# Patient Record
Sex: Female | Born: 1950 | Race: White
Health system: Southern US, Community
[De-identification: ages and names within clinical notes are randomized; demographics above are authoritative.]

## PROBLEM LIST (undated history)

## (undated) DIAGNOSIS — R252 Cramp and spasm: Secondary | ICD-10-CM

## (undated) DIAGNOSIS — S62609A Fracture of unspecified phalanx of unspecified finger, initial encounter for closed fracture: Secondary | ICD-10-CM

## (undated) DIAGNOSIS — M1990 Unspecified osteoarthritis, unspecified site: Secondary | ICD-10-CM

## (undated) DIAGNOSIS — M899 Disorder of bone, unspecified: Secondary | ICD-10-CM

## (undated) DIAGNOSIS — R5381 Other malaise: Secondary | ICD-10-CM

## (undated) DIAGNOSIS — L719 Rosacea, unspecified: Secondary | ICD-10-CM

## (undated) DIAGNOSIS — K2100 Gastro-esophageal reflux disease with esophagitis, without bleeding: Secondary | ICD-10-CM

## (undated) DIAGNOSIS — M2550 Pain in unspecified joint: Secondary | ICD-10-CM

## (undated) DIAGNOSIS — R599 Enlarged lymph nodes, unspecified: Secondary | ICD-10-CM

## (undated) DIAGNOSIS — Q828 Other specified congenital malformations of skin: Secondary | ICD-10-CM

## (undated) DIAGNOSIS — R7309 Other abnormal glucose: Secondary | ICD-10-CM

## (undated) DIAGNOSIS — N183 Chronic kidney disease, stage 3 unspecified: Secondary | ICD-10-CM

## (undated) DIAGNOSIS — M545 Low back pain, unspecified: Secondary | ICD-10-CM

## (undated) DIAGNOSIS — E669 Obesity, unspecified: Secondary | ICD-10-CM

## (undated) DIAGNOSIS — K449 Diaphragmatic hernia without obstruction or gangrene: Secondary | ICD-10-CM

## (undated) DIAGNOSIS — Z860100 Personal history of colon polyps, unspecified: Secondary | ICD-10-CM

## (undated) DIAGNOSIS — E785 Hyperlipidemia, unspecified: Secondary | ICD-10-CM

## (undated) DIAGNOSIS — K5900 Constipation, unspecified: Secondary | ICD-10-CM

## (undated) DIAGNOSIS — I10 Essential (primary) hypertension: Secondary | ICD-10-CM

## (undated) DIAGNOSIS — M949 Disorder of cartilage, unspecified: Secondary | ICD-10-CM

## (undated) DIAGNOSIS — R002 Palpitations: Secondary | ICD-10-CM

## (undated) DIAGNOSIS — R079 Chest pain, unspecified: Secondary | ICD-10-CM

## (undated) DIAGNOSIS — E559 Vitamin D deficiency, unspecified: Secondary | ICD-10-CM

## (undated) DIAGNOSIS — R5383 Other fatigue: Secondary | ICD-10-CM

## (undated) DIAGNOSIS — M810 Age-related osteoporosis without current pathological fracture: Secondary | ICD-10-CM

## (undated) DIAGNOSIS — Z955 Presence of coronary angioplasty implant and graft: Secondary | ICD-10-CM

## (undated) DIAGNOSIS — Z531 Procedure and treatment not carried out because of patient's decision for reasons of belief and group pressure: Secondary | ICD-10-CM

## (undated) DIAGNOSIS — H539 Unspecified visual disturbance: Secondary | ICD-10-CM

## (undated) HISTORY — DX: Unspecified visual disturbance: H53.9

## (undated) HISTORY — DX: Presence of coronary angioplasty implant and graft: Z95.5

## (undated) HISTORY — DX: Procedure and treatment not carried out because of patient's decision for reasons of belief and group pressure: Z53.1

## (undated) HISTORY — DX: Essential (primary) hypertension: I10

## (undated) HISTORY — DX: Age-related osteoporosis without current pathological fracture: M81.0

## (undated) HISTORY — PX: CAROTID STENT: SHX1301

## (undated) HISTORY — DX: Rosacea, unspecified: L71.9

## (undated) HISTORY — DX: Enlarged lymph nodes, unspecified: R59.9

## (undated) HISTORY — DX: Palpitations: R00.2

## (undated) HISTORY — DX: Gastro-esophageal reflux disease with esophagitis, without bleeding: K21.00

## (undated) HISTORY — DX: Vitamin D deficiency, unspecified: E55.9

## (undated) HISTORY — DX: Low back pain, unspecified: M54.50

## (undated) HISTORY — DX: Personal history of colonic polyps: Z86.010

## (undated) HISTORY — DX: Chronic kidney disease, stage 3 (moderate): N18.3

## (undated) HISTORY — DX: Other specified congenital malformations of skin: Q82.8

## (undated) HISTORY — DX: Obesity, unspecified: E66.9

## (undated) HISTORY — DX: Other malaise: R53.81

## (undated) HISTORY — DX: Chronic kidney disease, stage 3 unspecified: N18.30

## (undated) HISTORY — DX: Disorder of bone, unspecified: M89.9

## (undated) HISTORY — DX: Other abnormal glucose: R73.09

## (undated) HISTORY — DX: Unspecified osteoarthritis, unspecified site: M19.90

## (undated) HISTORY — DX: Cramp and spasm: R25.2

## (undated) HISTORY — DX: Pain in unspecified joint: M25.50

## (undated) HISTORY — DX: Fracture of unspecified phalanx of unspecified finger, initial encounter for closed fracture: S62.609A

## (undated) HISTORY — DX: Disorder of cartilage, unspecified: M94.9

## (undated) HISTORY — DX: Diaphragmatic hernia without obstruction or gangrene: K44.9

## (undated) HISTORY — DX: Hyperlipidemia, unspecified: E78.5

## (undated) HISTORY — DX: Personal history of colon polyps, unspecified: Z86.0100

## (undated) HISTORY — DX: Constipation, unspecified: K59.00

## (undated) HISTORY — DX: Other malaise: R53.83

## (undated) HISTORY — DX: Chest pain, unspecified: R07.9

## (undated) HISTORY — PX: WRIST SURGERY: SHX841

## (undated) HISTORY — DX: Gastro-esophageal reflux disease with esophagitis: K21.0

## (undated) HISTORY — DX: Low back pain: M54.5

---

## 1983-06-15 HISTORY — PX: OTHER SURGICAL HISTORY: SHX169

## 1984-06-14 HISTORY — PX: BACK SURGERY: SHX140

## 1998-06-28 ENCOUNTER — Encounter: Payer: Self-pay | Admitting: Emergency Medicine

## 1998-06-28 ENCOUNTER — Emergency Department (HOSPITAL_COMMUNITY): Admission: EM | Admit: 1998-06-28 | Discharge: 1998-06-28 | Payer: Self-pay | Admitting: Emergency Medicine

## 2001-09-08 ENCOUNTER — Other Ambulatory Visit: Admission: RE | Admit: 2001-09-08 | Discharge: 2001-09-08 | Payer: Self-pay | Admitting: Family Medicine

## 2001-10-22 ENCOUNTER — Encounter: Payer: Self-pay | Admitting: Emergency Medicine

## 2001-10-22 ENCOUNTER — Observation Stay (HOSPITAL_COMMUNITY): Admission: EM | Admit: 2001-10-22 | Discharge: 2001-10-23 | Payer: Self-pay | Admitting: *Deleted

## 2001-10-24 ENCOUNTER — Encounter: Admission: RE | Admit: 2001-10-24 | Discharge: 2001-10-24 | Payer: Self-pay | Admitting: Family Medicine

## 2003-01-08 ENCOUNTER — Encounter: Payer: Self-pay | Admitting: Family Medicine

## 2003-01-08 ENCOUNTER — Encounter: Admission: RE | Admit: 2003-01-08 | Discharge: 2003-01-08 | Payer: Self-pay | Admitting: Family Medicine

## 2003-01-23 ENCOUNTER — Ambulatory Visit (HOSPITAL_BASED_OUTPATIENT_CLINIC_OR_DEPARTMENT_OTHER): Admission: RE | Admit: 2003-01-23 | Discharge: 2003-01-23 | Payer: Self-pay | Admitting: Family Medicine

## 2003-04-26 ENCOUNTER — Ambulatory Visit (HOSPITAL_COMMUNITY): Admission: RE | Admit: 2003-04-26 | Discharge: 2003-04-26 | Payer: Self-pay | Admitting: Gastroenterology

## 2003-04-26 ENCOUNTER — Encounter (INDEPENDENT_AMBULATORY_CARE_PROVIDER_SITE_OTHER): Payer: Self-pay

## 2008-09-02 ENCOUNTER — Emergency Department (HOSPITAL_COMMUNITY): Admission: EM | Admit: 2008-09-02 | Discharge: 2008-09-02 | Payer: Self-pay | Admitting: Emergency Medicine

## 2008-09-09 ENCOUNTER — Encounter: Admission: RE | Admit: 2008-09-09 | Discharge: 2008-09-09 | Payer: Self-pay | Admitting: Orthopedic Surgery

## 2008-09-17 ENCOUNTER — Encounter: Admission: RE | Admit: 2008-09-17 | Discharge: 2008-09-17 | Payer: Self-pay | Admitting: Orthopedic Surgery

## 2010-05-14 HISTORY — PX: FOOT SURGERY: SHX648

## 2010-07-05 ENCOUNTER — Encounter: Payer: Self-pay | Admitting: Family Medicine

## 2010-09-24 LAB — POCT I-STAT, CHEM 8
BUN: 18 mg/dL (ref 6–23)
Creatinine, Ser: 1.1 mg/dL (ref 0.4–1.2)
Glucose, Bld: 103 mg/dL — ABNORMAL HIGH (ref 70–99)
Hemoglobin: 13.9 g/dL (ref 12.0–15.0)
Potassium: 4.2 mEq/L (ref 3.5–5.1)
Sodium: 139 mEq/L (ref 135–145)
TCO2: 27 mmol/L (ref 0–100)

## 2010-09-24 LAB — POCT CARDIAC MARKERS
CKMB, poc: 1.4 ng/mL (ref 1.0–8.0)
Myoglobin, poc: 67.3 ng/mL (ref 12–200)

## 2010-09-24 LAB — DIFFERENTIAL
Basophils Relative: 1 % (ref 0–1)
Eosinophils Absolute: 0.2 10*3/uL (ref 0.0–0.7)
Lymphs Abs: 1.7 10*3/uL (ref 0.7–4.0)
Monocytes Absolute: 0.3 10*3/uL (ref 0.1–1.0)
Monocytes Relative: 6 % (ref 3–12)
Neutrophils Relative %: 57 % (ref 43–77)

## 2010-09-24 LAB — CBC
Hemoglobin: 13.9 g/dL (ref 12.0–15.0)
MCHC: 34.6 g/dL (ref 30.0–36.0)
MCV: 88.6 fL (ref 78.0–100.0)
RBC: 4.54 MIL/uL (ref 3.87–5.11)
WBC: 5.2 10*3/uL (ref 4.0–10.5)

## 2010-10-30 NOTE — Discharge Summary (Signed)
[place]. [facility]  Patient:    ABRIAL, ARRIGHI Visit Number: [id number] MRN: [id number]          Service Type: MED Location: 5500 [id number] [id number] Attending Physician:  Tobin Chad Dictated by:   Rosemarie Ax, M.D. Admit Date:  10/22/2001 Discharge Date: 10/23/2001   CC:         [facility]   Discharge Summary  DATE OF BIRTH:  [birth year]  DISCHARGE DIAGNOSES: 1. Chest pain, rule out myocardial infarction. 2. Hypothyroidism. 3. Hypercholesterolemia.  DISCHARGE MEDICATIONS: 1. Aspirin 81 mg p.o. q.d. 2. Synthroid usual home dose. 3. Protonix 40 mg p.o. q.d. 4. Vioxx 25 mg p.o. q.d. x2 weeks. 5. Usual multivitamin.  DISCHARGE INSTRUCTIONS:  Low cholesterol diet.  FOLLOW-UP:  [facility] on Friday, Oct 27, 2001, as scheduled.  HOSPITAL COURSE:  Ms. Varone is a very pleasant 60 year old Caucasian female who experienced the sudden onset of ache in her left shoulder area starting on the day prior to admission.  She denied diaphoresis, nausea and vomiting, dyspnea on exertion, substernal chest pain, radiation.  The pain onset was at rest.  She has no cardiac history.  LABORATORY DATA:  White blood cell count 7.6, hemoglobin 14.3, platelets 283, sodium 138, potassium 3.7, chloride 107, bicarb 21, BUN 12, creatinine 0.7, glucose 114.  Initial EKG showed normal sinus rhythm, no ST or T wave changes. Initial chest x-ray was normal.  #1 - Chest pain, rule out MI.  The patients chest pain was very atypical in nature.  She ruled out for myocardial infarction with three sets of negative cardiac enzymes.  She was admitted on telemetry which showed normal sinus rhythm with no arrhythmia or other events throughout hospitalization.  The patient did not experience further chest pain during admission.  The etiology of her pain is likely musculoskeletal given that her pain was localized to her shoulder area  and she did have some limitation of range of motion.  It may also be a referred pain from gastroesophageal reflux disease.  So the patient was discharged on a proton pump inhibitor as above. The patient will be discharged on Vioxx for empiric treatment of musculoskeletal shoulder pain.  #2 - Hypothyroidism.  The patient had recently been started on Synthroid. The patients dose of Synthroid was unclear as she stated that she takes 0.25 mg, although, this seems like a very high dose.  However, her TSH was normal at 4.201 and her free T4 was normal at 1.03.  So her current dose is appropriate.  #3 - Hypercholesterolemia.  The patient is not currently on any medicine for cholesterol.  A fasting lipid panel showed cholesterol of 212, triglycerides 197, HDL 44, LDL 129, so the patient was not started on any antihyperlipidemic medicine.  INSTRUCTIONS FOR PRIMARY M.D.:  The patient will need risk stratification of some kind, likely exercise treadmill testing as an outpatient. Dictated by:   Rosemarie Ax, M.D. Attending Physician:  Tobin Chad DD:  10/24/01 TD:  10/26/01 Job: [id number]

## 2010-10-30 NOTE — Op Note (Signed)
   NAME:  Sharon Byrd, Sharon Byrd                 ACCOUNT NO.:  [id number]   MEDICAL RECORD NO.:  [id number]                   PATIENT TYPE:  AMB   LOCATION:  ENDO                                 FACILITY:  [facility]   PHYSICIAN:  Graylin Shiver, M.D.                DATE OF BIRTH:  1951/01/02   DATE OF PROCEDURE:  04/26/2003  DATE OF DISCHARGE:                                 OPERATIVE REPORT   INDICATIONS:  Screening.   Informed consent was obtained after explanation of the risks of bleeding,  infection, and perforation.   PREMEDICATION:  Fentanyl 70 mcg IV, Versed 7 mg IV.   PROCEDURE:  With the patient in the left lateral decubitus position, a  rectal exam was performed, and no masses were felt.  The Olympus colonoscope  was inserted into the rectum and advanced around the colon to the cecum. The  cecal landmarks were identified.  The cecum and ascending colon were normal.  The transverse colon was normal.  In the descending colon, there was a 5 mm  sessile polyp, snared and removed by snare cautery technique.  In the  sigmoid, there was a 5 mm sessile polyp removed by snare cautery technique.  In the rectum, there were two small sessile polyps.  One was 5 mm, one was 3  mm.  These were removed by snare cautery technique.  All cautery sites  looked good.  Polyps were retrieved.  She tolerated the procedure well  without complications.   IMPRESSION:  Colon polyps.   PLAN:  Pathology will be checked.  If these are adenomatous polyps, I would  recommend a followup colonoscopy again in five years.                                               Graylin Shiver, M.D.    SFG/MEDQ  D:  04/26/2003  T:  04/26/2003  Job:  [id number]   cc:   Clydie Braun L. Hal Hope, M.D.  [address] [place]  [place] [address]  Fax: [phone]

## 2010-10-30 NOTE — H&P (Signed)
[place]. [facility]  Patient:    Sharon Byrd, Sharon Byrd Visit Number: [id number] MRN: [id number]          Service Type: MED Location: 5500 [id number] 02 Attending Physician:  Tobin Chad Dictated by:   Kevin Fenton, M.D. Admit Date:  10/22/2001 Discharge Date: 10/23/2001   CC:         [facility]   History and Physical  CHIEF COMPLAINT:  Chest pain.  HISTORY OF PRESENT ILLNESS:  A 60 year old Caucasian female with sudden onset of achy left shoulder pain at 11:30 p.m. last night.  It did not radiate and essentially resolved overnight.  She never had pain like this before and it was not associated with diaphoresis, nausea, vomiting.  She does not complain of peripheral edema.  No DOE, no PND, no substernal chest pain.  The shoulder pain was unrelieved by nitroglycerin in the ER.  REVIEW OF SYSTEMS:  As above; otherwise, negative.  PAST MEDICAL HISTORY: 1. Hypercholesterolemia. 2. hypothyroidism.  MEDICATIONS: 1. Synthroid 0.25 mg q.d. 2. Vitamins daily. 3. Potassium daily.  ALLERGIES:  No known drug allergies.  SOCIAL HISTORY:  She smoked for 13 years but quit in 1983.  Rare alcohol use. She is an Airline pilot, she is divorced.  She has a son and daughter who are both grown.  FAMILY HISTORY:  Father died at age 56.  He had an MI at age 59; he also had a stroke.  Brother is alive.  He had an MI at age 59.  There is no family history of diabetes or hypertension but there are strokes in both mother and father.  PHYSICAL EXAMINATION:  VITAL SIGNS:  Heart rate 79, blood pressure 118/68, SAO2 greater than 97% on room air.  GENERAL:  She is pleasant and alert in no distress.  HEENT:  No bruits, no JVD, no lymphadenopathy, no thyromegaly.  CHEST:  Lungs are clear to auscultation.  Heart rate is regular without murmurs.  No chest pain to palpation.  ABDOMEN:  Soft, nontender, with good bowel sounds.  EXTREMITIES:  No  edema, good pulses.  Her left shoulder has full range of motion with 5/5 motor, mild tenderness to palpation in the [facility] joint.  No impingement signs.  NEUROLOGIC:  Nonfocal.  SKIN:  Dry.  LABORATORY DATA:  EKG shows normal sinus rhythm, no signs of ischemia.  Chest x-ray is negative.  White count 7.6, hemoglobin 14.3, platelets 283.  Creatinine 0.7.  A pH is 7.435.  CK initially 79, MB 1.5, relative index is invalid.  Lipase 37. Troponin less than 0.01.  ASSESSMENT AND PLAN:  A 60 year old Caucasian female with left shoulder pain and few risk factors for coronary artery disease.  She is stable and has negative enzymes so far and a negative EKG.  Will bring her in for 23-hour observation and rule out MI, give her a telemetry bed, and watch for arrhythmias.  Plan on ibuprofen as needed for her shoulder pain.  I will start her on an aspirin.  She will need risk stratification as an outpatient with an exercise treadmill.  Will check a TSH and fasting lipids here, but I doubt cardiac etiology.  This case has been discussed with Dr. Zachery Dauer. Dictated by:   Kevin Fenton, M.D. Attending Physician:  Tobin Chad DD:  10/22/01 TD:  10/23/01 Job: [id number] [id number]

## 2011-03-05 ENCOUNTER — Other Ambulatory Visit: Payer: Self-pay | Admitting: Otolaryngology

## 2011-03-05 DIAGNOSIS — R070 Pain in throat: Secondary | ICD-10-CM

## 2011-04-29 LAB — HM DEXA SCAN

## 2011-04-30 ENCOUNTER — Other Ambulatory Visit: Payer: Self-pay

## 2011-05-04 ENCOUNTER — Ambulatory Visit
Admission: RE | Admit: 2011-05-04 | Discharge: 2011-05-04 | Disposition: A | Discharge status: Home / Self Care | Payer: BC Managed Care – PPO | Source: Ambulatory Visit | Attending: Otolaryngology | Admitting: Otolaryngology

## 2011-05-04 DIAGNOSIS — R070 Pain in throat: Secondary | ICD-10-CM

## 2011-05-04 MED ORDER — IOHEXOL 300 MG/ML  SOLN
75.0000 mL | Freq: Once | INTRAMUSCULAR | Status: AC | PRN
  Administered 2011-05-04: 75 mL via INTRAVENOUS

## 2012-09-27 LAB — HM MAMMOGRAPHY: HM Mammogram: NORMAL

## 2012-10-13 ENCOUNTER — Encounter: Payer: Self-pay | Admitting: Internal Medicine

## 2012-11-03 ENCOUNTER — Other Ambulatory Visit: Payer: Self-pay | Admitting: Geriatric Medicine

## 2012-11-03 ENCOUNTER — Encounter: Payer: Self-pay | Admitting: Geriatric Medicine

## 2012-11-03 ENCOUNTER — Other Ambulatory Visit: Payer: BC Managed Care – PPO

## 2012-11-03 DIAGNOSIS — E785 Hyperlipidemia, unspecified: Secondary | ICD-10-CM

## 2012-11-03 DIAGNOSIS — Z1329 Encounter for screening for other suspected endocrine disorder: Secondary | ICD-10-CM

## 2012-11-03 DIAGNOSIS — N183 Chronic kidney disease, stage 3 unspecified: Secondary | ICD-10-CM

## 2012-11-03 DIAGNOSIS — R7309 Other abnormal glucose: Secondary | ICD-10-CM

## 2012-11-03 DIAGNOSIS — I10 Essential (primary) hypertension: Secondary | ICD-10-CM

## 2012-11-03 DIAGNOSIS — E559 Vitamin D deficiency, unspecified: Secondary | ICD-10-CM

## 2012-11-04 LAB — CBC WITH DIFFERENTIAL/PLATELET
Basos: 1 % (ref 0–3)
Eos: 3 % (ref 0–5)
Hemoglobin: 14.7 g/dL (ref 11.1–15.9)
Immature Grans (Abs): 0 10*3/uL (ref 0.0–0.1)
Lymphs: 32 % (ref 14–46)
Neutrophils Absolute: 3.3 10*3/uL (ref 1.4–7.0)
Neutrophils Relative %: 56 % (ref 40–74)
RBC: 4.94 x10E6/uL (ref 3.77–5.28)
WBC: 5.8 10*3/uL (ref 3.4–10.8)

## 2012-11-04 LAB — COMPREHENSIVE METABOLIC PANEL
ALT: 25 IU/L (ref 0–32)
Albumin/Globulin Ratio: 2 (ref 1.1–2.5)
Albumin: 4.7 g/dL (ref 3.6–4.8)
BUN: 13 mg/dL (ref 8–27)
Calcium: 10.1 mg/dL (ref 8.6–10.2)
Creatinine, Ser: 1.02 mg/dL — ABNORMAL HIGH (ref 0.57–1.00)
GFR calc Af Amer: 68 mL/min/{1.73_m2} (ref >59)
GFR calc non Af Amer: 59 mL/min/{1.73_m2} — ABNORMAL LOW (ref >59)
Globulin, Total: 2.3 g/dL (ref 1.5–4.5)
Glucose: 102 mg/dL — ABNORMAL HIGH (ref 65–99)
Total Protein: 7 g/dL (ref 6.0–8.5)

## 2012-11-04 LAB — LIPID PANEL
Chol/HDL Ratio: 3.5 ratio units (ref 0.0–4.4)
HDL: 61 mg/dL (ref >39)
LDL Calculated: 118 mg/dL — ABNORMAL HIGH (ref 0–99)
VLDL Cholesterol Cal: 32 mg/dL (ref 5–40)

## 2012-11-04 LAB — TSH: TSH: 4.4 u[IU]/mL (ref 0.450–4.500)

## 2012-11-04 LAB — MICROALBUMIN / CREATININE URINE RATIO: Creatinine, Ur: 187.4 mg/dL (ref 15.0–278.0)

## 2012-11-07 ENCOUNTER — Ambulatory Visit (INDEPENDENT_AMBULATORY_CARE_PROVIDER_SITE_OTHER): Payer: BC Managed Care – PPO | Admitting: Internal Medicine

## 2012-11-07 ENCOUNTER — Encounter: Payer: Self-pay | Admitting: Internal Medicine

## 2012-11-07 VITALS — BP 122/84 | HR 61 | Temp 98.1°F | Resp 14 | Ht 65.0 in | Wt 170.0 lb

## 2012-11-07 DIAGNOSIS — N183 Chronic kidney disease, stage 3 unspecified: Secondary | ICD-10-CM

## 2012-11-07 DIAGNOSIS — R7303 Prediabetes: Secondary | ICD-10-CM

## 2012-11-07 DIAGNOSIS — N1831 Chronic kidney disease, stage 3a: Secondary | ICD-10-CM

## 2012-11-07 DIAGNOSIS — K219 Gastro-esophageal reflux disease without esophagitis: Secondary | ICD-10-CM

## 2012-11-07 DIAGNOSIS — E785 Hyperlipidemia, unspecified: Secondary | ICD-10-CM

## 2012-11-07 DIAGNOSIS — R7309 Other abnormal glucose: Secondary | ICD-10-CM

## 2012-11-07 MED ORDER — CALCIUM-VITAMIN D 500-200 MG-UNIT PO TABS: 1.0000 | ORAL_TABLET | Freq: Two times a day (BID) | ORAL | Status: DC

## 2012-11-07 MED ORDER — VITAMIN D (ERGOCALCIFEROL) 1.25 MG (50000 UNIT) PO CAPS: 50000.0000 [IU] | ORAL_CAPSULE | ORAL | Status: DC

## 2012-11-07 MED ORDER — RANITIDINE HCL 150 MG PO CAPS: 150.0000 mg | ORAL_CAPSULE | Freq: Two times a day (BID) | ORAL | Status: DC

## 2012-11-07 NOTE — Progress Notes (Signed)
Subjective:    Patient ID: Sharon Byrd, female    DOB: [birth year], 62 y.o.   MRN: [id number]  HPI Patient is here for her regular follow up. Her reflux was improved for some time and not has acted up again. She has been staying late night, has increased work load stressing her and has been non compliant with her diet as well.she lost her script for ranitidine and did not take it Her labs were reviewed. She has decreased creatinine clearance.  She has low vit d level. Not on any ca-vit d supplement  Review of Systems  Constitutional: Negative for fever, chills, appetite change, fatigue and unexpected weight change.  HENT: Negative for mouth sores and sinus pressure.   Respiratory: Negative for shortness of breath.   Cardiovascular: Negative for chest pain, palpitations and leg swelling.  Gastrointestinal: Negative for constipation.  Genitourinary: Negative for dysuria.  Musculoskeletal: Negative for arthralgias.  Skin: Negative for rash.  Neurological: Negative for dizziness and syncope.  Hematological: Negative for adenopathy.  Psychiatric/Behavioral: Negative for behavioral problems and agitation.      Objective:   Physical Exam  Constitutional: She is oriented to person, place, and time. She appears well-developed and well-nourished. No distress.  HENT:  Head: Normocephalic and atraumatic.  Mouth/Throat: Oropharynx is clear and moist. No oropharyngeal exudate.  Eyes: Conjunctivae are normal. Pupils are equal, round, and reactive to light.  Neck: Normal range of motion. Neck supple. No JVD present.  Cardiovascular: Normal rate and regular rhythm.   Pulmonary/Chest: Effort normal and breath sounds normal. No respiratory distress. She has no wheezes. She has no rales.  Abdominal: Soft. Bowel sounds are normal. She exhibits no distension.  Musculoskeletal: Normal range of motion. She exhibits no edema and no tenderness.  Lymphadenopathy:    She has no cervical  adenopathy.  Neurological: She is alert and oriented to person, place, and time.  Skin: Skin is warm and dry. She is not diaphoretic.  Psychiatric: She has a normal mood and affect. Her behavior is normal.   BP 122/84  Pulse 61  Temp(Src) 98.1 F (36.7 C) (Oral)  Resp 14  Ht 5\' 5"  (1.651 m)  Wt 170 lb (77.111 kg)  BMI 28.29 kg/m2  Labs reviewed CBC    Component Value Date/Time   WBC 5.8 11/03/2012 1026   WBC 5.2 09/02/2008 1048   RBC 4.94 11/03/2012 1026   RBC 4.54 09/02/2008 1048   HGB 14.7 11/03/2012 1026   HCT 44.0 11/03/2012 1026   PLT 232 09/02/2008 1048   MCV 89 11/03/2012 1026   MCH 29.8 11/03/2012 1026   MCHC 33.4 11/03/2012 1026   MCHC 34.6 09/02/2008 1048   RDW 14.1 11/03/2012 1026   RDW 12.7 09/02/2008 1048   LYMPHSABS 1.9 11/03/2012 1026   LYMPHSABS 1.7 09/02/2008 1048   MONOABS 0.3 09/02/2008 1048   EOSABS 0.2 11/03/2012 1026   EOSABS 0.2 09/02/2008 1048   BASOSABS 0.0 11/03/2012 1026   BASOSABS 0.0 09/02/2008 1048    CMP     Component Value Date/Time   NA 140 11/03/2012 1026   NA 139 09/02/2008 1109   K 4.2 11/03/2012 1026   CL 101 11/03/2012 1026   CO2 27 11/03/2012 1026   GLUCOSE 102* 11/03/2012 1026   GLUCOSE 103* 09/02/2008 1109   BUN 13 11/03/2012 1026   BUN 18 09/02/2008 1109   CREATININE 1.02* 11/03/2012 1026   CALCIUM 10.1 11/03/2012 1026   PROT 7.0 11/03/2012 1026   AST 25 11/03/2012  1026   ALT 25 11/03/2012 1026   ALKPHOS 75 11/03/2012 1026   BILITOT 1.4* 11/03/2012 1026   GFRNONAA 59* 11/03/2012 1026   GFRAA 68 11/03/2012 1026   Lipid Panel     Component Value Date/Time   TRIG 158* 11/03/2012 1026   HDL 61 11/03/2012 1026   CHOLHDL 3.5 11/03/2012 1026   LDLCALC 118* 11/03/2012 1026   a1c- 6.1  tsh 4.4  Vitamin d 24.4     Assessment & Plan:   gerd- worsening reflux symptoms. Pt willing to try ranitidine. Will provide ranitidine 150 mg bid and continue delixant. Dietary modification recommended. Normal oropharyngeal exam. Has hx of h.pylori in past. If  symptoms persists or worsens, consider repeat EGD  Hyperlipidemia- persists, continue simvastatin 40 mg daily  Vit d def- start vit d 50,000 iu once a week and vit d -ca supplement  Prediabetes- with a1c and mild microalbumin in urine and decreased creatinine clearance, monitor a1c. Encouraged to exercise and cut down on sweets and fried food. Recheck a1c prior to next visit  ckd stage 3-  Likely vascular with prediabetes and hyperlipidemia. avoid nsaids, keep hydrated, normal bp Reviewed mammogram result with patient

## 2012-11-10 ENCOUNTER — Other Ambulatory Visit: Payer: Self-pay | Admitting: Geriatric Medicine

## 2012-11-10 MED ORDER — SIMVASTATIN 40 MG PO TABS: 40.0000 mg | ORAL_TABLET | Freq: Every day | ORAL | Status: DC

## 2012-11-28 ENCOUNTER — Encounter: Payer: Self-pay | Admitting: *Deleted

## 2012-11-29 ENCOUNTER — Encounter: Payer: Self-pay | Admitting: Internal Medicine

## 2012-11-29 ENCOUNTER — Ambulatory Visit (INDEPENDENT_AMBULATORY_CARE_PROVIDER_SITE_OTHER): Payer: BC Managed Care – PPO | Admitting: Internal Medicine

## 2012-11-29 VITALS — BP 124/78 | HR 84 | Temp 97.0°F | Resp 14 | Ht 65.0 in | Wt 169.6 lb

## 2012-11-29 DIAGNOSIS — H6120 Impacted cerumen, unspecified ear: Secondary | ICD-10-CM

## 2012-11-29 DIAGNOSIS — J069 Acute upper respiratory infection, unspecified: Secondary | ICD-10-CM

## 2012-11-29 DIAGNOSIS — H6123 Impacted cerumen, bilateral: Secondary | ICD-10-CM

## 2012-11-29 MED ORDER — AZITHROMYCIN 250 MG PO TABS: ORAL_TABLET | ORAL | Status: AC

## 2012-11-29 MED ORDER — CARBAMIDE PEROXIDE 6.5 % OT SOLN: 5.0000 [drp] | Freq: Two times a day (BID) | OTIC | Status: DC

## 2012-11-29 NOTE — Progress Notes (Signed)
  Subjective:    Patient ID: Sharon Byrd, female    DOB: 08/24/1950, 62 y.o.   MRN: [id number]  HPI  She started with headache followed by earache and now feels like she has sore throat. Her ears are bothering her mainly at night. It feels stuffed. Denies tinnitus or ear drainage.  She had runny nose and dry cough this weekend Her throat is scratchy She feels stuffed in her head at times No fever or chills Appetite is good  Review of Systems  Constitutional: Negative for appetite change and fatigue.  HENT: Positive for sneezing. Negative for hearing loss, mouth sores, trouble swallowing, neck pain and voice change.   Eyes: Negative for visual disturbance.  Respiratory: Positive for cough. Negative for shortness of breath and wheezing.   Cardiovascular: Negative for chest pain and palpitations.  Gastrointestinal: Negative for abdominal pain.  Hematological: Negative for adenopathy.       Objective:   Physical Exam  Constitutional: She is oriented to person, place, and time. She appears well-developed and well-nourished. No distress.  HENT:  Head: Normocephalic and atraumatic.  Mouth/Throat: No oropharyngeal exudate.  Has cerumen in both ears left > right, has nasal mucosal edema and redness  Eyes: Conjunctivae and EOM are normal. Pupils are equal, round, and reactive to light.  Neck: Normal range of motion.  Right anterior cervical LN palpable and tender  Cardiovascular: Normal rate and regular rhythm.   Pulmonary/Chest: Effort normal and breath sounds normal. She has no wheezes. She has no rales. She exhibits no tenderness.  Abdominal: Soft. Bowel sounds are normal. She exhibits no mass. There is no tenderness.  Musculoskeletal: Normal range of motion.  Lymphadenopathy:    She has cervical adenopathy.  Neurological: She is alert and oriented to person, place, and time.  Skin: Skin is warm and dry. She is not diaphoretic.  Psychiatric: She has a normal mood and  affect.    BP 124/78  Pulse 84  Temp(Src) 97 F (36.1 C) (Oral)  Resp 14  Ht 5\' 5"  (1.651 m)  Wt 169 lb 9.6 oz (76.93 kg)  BMI 28.22 kg/m2      Assessment & Plan:   Acute URI- will provide z pack course of 5 days with chloraseptic spray and losenges for symptomatic relief. Reassess if no imporvement  Impacted cerumen- will get ear lavage for left ear and debrox ear drops for right ear

## 2013-02-19 ENCOUNTER — Other Ambulatory Visit: Payer: BC Managed Care – PPO

## 2013-02-19 DIAGNOSIS — R7303 Prediabetes: Secondary | ICD-10-CM

## 2013-02-19 DIAGNOSIS — N1831 Chronic kidney disease, stage 3a: Secondary | ICD-10-CM

## 2013-02-20 ENCOUNTER — Ambulatory Visit: Payer: Self-pay | Admitting: Internal Medicine

## 2013-02-20 LAB — BASIC METABOLIC PANEL
BUN: 14 mg/dL (ref 8–27)
CO2: 25 mmol/L (ref 18–29)
Chloride: 103 mmol/L (ref 97–108)
Creatinine, Ser: 0.95 mg/dL (ref 0.57–1.00)
GFR calc Af Amer: 74 mL/min/{1.73_m2} (ref >59)
Glucose: 101 mg/dL — ABNORMAL HIGH (ref 65–99)

## 2013-02-20 LAB — HEMOGLOBIN A1C
Est. average glucose Bld gHb Est-mCnc: 120 mg/dL
Hgb A1c MFr Bld: 5.8 % — ABNORMAL HIGH (ref 4.8–5.6)

## 2013-02-21 ENCOUNTER — Ambulatory Visit (INDEPENDENT_AMBULATORY_CARE_PROVIDER_SITE_OTHER): Payer: BC Managed Care – PPO | Admitting: Internal Medicine

## 2013-02-21 ENCOUNTER — Encounter: Payer: Self-pay | Admitting: Internal Medicine

## 2013-02-21 VITALS — BP 130/78 | HR 67 | Temp 97.5°F | Wt 168.0 lb

## 2013-02-21 DIAGNOSIS — E559 Vitamin D deficiency, unspecified: Secondary | ICD-10-CM | POA: Insufficient documentation

## 2013-02-21 DIAGNOSIS — K219 Gastro-esophageal reflux disease without esophagitis: Secondary | ICD-10-CM

## 2013-02-21 DIAGNOSIS — R0789 Other chest pain: Secondary | ICD-10-CM

## 2013-02-21 DIAGNOSIS — R002 Palpitations: Secondary | ICD-10-CM

## 2013-02-21 DIAGNOSIS — K625 Hemorrhage of anus and rectum: Secondary | ICD-10-CM

## 2013-02-21 DIAGNOSIS — E785 Hyperlipidemia, unspecified: Secondary | ICD-10-CM

## 2013-02-21 HISTORY — DX: Other chest pain: R07.89

## 2013-02-21 HISTORY — DX: Hemorrhage of anus and rectum: K62.5

## 2013-02-21 HISTORY — DX: Palpitations: R00.2

## 2013-02-21 NOTE — Progress Notes (Signed)
Patient ID: Sharon Byrd, female   DOB: 1951-04-09, 62 y.o.   MRN: [id number]  Chief Complaint  Patient presents with  . Medical Managment of Chronic Issues    3 month follow-up   . Palpitations    ongoing concerned, onset after excerising and eating  . Shortness of Breath  . Rectal Bleeding    4-5 episodes within the last couple months, x 2 episodes last week   Allergies  Allergen Reactions  . Metrogel [Metronidazole]     HPI- 62 y/o female patient is here for routine follow up  She has been having bleeding from her behind last week. She went out for a walk on hot weather and came back and cleaned herself and noted blood on her underpants and in the toilet paper that she used. Denies any blood in toilet bowl. No blood noted with stool. Denies any clots. Denies melena. Denies any hematuria.mentions she notices it only after walking for sometime in warm weather. No vaginal bleed No rectal pain. ocassional itching and she has tried vagisil cream which has helped with the itching She mentions this to have happened a couple of times over past couple of months. No known history of hemorrhoids  She has been having chest tightness and shortness of breath after her meals on and off for a couple of months. She [place] feels her heart to be racing with this. Last episode was a month back. She mentions it to happen after she eats and goes for mild- moderate exercise like going for a walk. It has always happened only after a meal. Denies any chest pain. No nausea or vomiting , numbness or tingling associated with it. Chest tightness is central and non radiating. Relieved with rest. She has hx of GERD and is on dexilant once a day and ranitidine 150 mg bid but taking it only once a day. She follows with GI and was last seen 2 years back. Reviewed prior records. EGD 2004 showed GERD and hiatal hernia. She also had colonoscopy and endoscopy 2012- no records for review.  She has been taking her  simvastatin and vit d supplement  She has been under excessive stress with her job and home at present. Denies panic attacks   Review of Systems  Constitutional: Negative for fever, chills, appetite change, fatigue and unexpected weight change.  HENT: Negative for mouth sores and sinus pressure.   Respiratory: Negative for shortness of breath at present, no cough Cardiovascular: Negative for chest pain and leg swelling. No palpitations at present Gastrointestinal: Negative for constipation or diarrhea. No nausea or vomiting Genitourinary: Negative for dysuria. No vaginal discharge Musculoskeletal: Negative for arthralgias.  Skin: Negative for rash.  Neurological: Negative for dizziness and syncope.  Hematological: Negative for adenopathy.  Psychiatric/Behavioral: Negative for behavioral problems and agitation.    Medication reviewed  Past Medical History  Diagnosis Date  . Obesity, unspecified   . Chronic kidney disease, stage III (moderate)   . Disorder of bone and cartilage, unspecified   . Other malaise and fatigue   . Cramp of limb   . Enlargement of lymph nodes   . Unspecified vitamin D deficiency   . Diaphragmatic hernia without mention of obstruction or gangrene   . Congenital pigmentary anomaly of skin   . Other abnormal glucose   . Personal history of colonic polyps   . Osteoarthrosis, unspecified whether generalized or localized, unspecified site   . Other and unspecified hyperlipidemia   . Unspecified essential hypertension   .  Reflux esophagitis   . Unspecified constipation   . Rosacea   . Pain in joint, site unspecified   . Lumbago   . Chest pain, unspecified     BP 130/78  Pulse 67  Temp(Src) 97.5 F (36.4 C) (Oral)  Wt 168 lb (76.204 kg)  BMI 27.96 kg/m2  SpO2 98%  Physical Exam  Constitutional: She is oriented to person, place, and time. She appears well-developed and well-nourished. Anxious but not in any distress HENT:   Head: Normocephalic  and atraumatic.   Mouth/Throat: Oropharynx is clear and moist. No oropharyngeal exudate.  Eyes: Conjunctivae are normal. Pupils are equal, round, and reactive to light.  Neck: Normal range of motion. Neck supple. No JVD present.  Cardiovascular: Normal rate and regular rhythm.  no chest tenderness on palpation Pulmonary/Chest: Effort normal and breath sounds normal. No respiratory distress. She has no wheezes. She has no rales.  Abdominal: Soft. Bowel sounds are normal. She exhibits no distension. No abdominal mass or tenderness Musculoskeletal: Normal range of motion. She exhibits no edema and no tenderness.  Lymphadenopathy:    She has no cervical adenopathy.  Neurological: She is alert and oriented to person, place, and time.  Skin: Skin is warm and dry. She is not diaphoretic.  Psychiatric: She has a normal mood and affect.  ASSESSMENT/PLAN  Chest tightness- for 2 months now, intermittent. Brought in post meals and exercise. Not a typical chest pain but given it being associated with palpitations and dyspnea, will get exercise stress test to rule out ischemic cause. Will also refer her to GI given her hx of gerd, hiatal hernia and h.pylori infection in past along with its relation with meals. To take her dexilant and ranitidine as prescribed for now. Avoiding late meals, taking small but frequent meals and immediate notification if has another episode. Pt voices understanding this. Patient might need another  EGD to assess for gastritis / ulcer  gerd- worsening reflux symptoms. Normal oropharyngeal exam. Has hx of h.pylori in past and hiatal hernia. See above  Palpitations- normal EKG with sinus rhythm. Will rule out hyperthyroidism. Cut down on caffeine intake.   Rectal bleed- guaiac stool positive. No external hemorrhoids or fissures noted. No rectal mass on exam. Will get cbc checked to rule out significant blood loss. Regular bowel movement. Unclear of the cause at present. 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

## 2013-02-22 LAB — CBC WITH DIFFERENTIAL/PLATELET
Basophils Absolute: 0 10*3/uL (ref 0.0–0.2)
Hemoglobin: 14.7 g/dL (ref 11.1–15.9)
Immature Granulocytes: 0 % (ref 0–2)
Lymphs: 30 % (ref 14–46)
MCHC: 34.3 g/dL (ref 31.5–35.7)
Monocytes: 10 % (ref 4–12)
RDW: 13.4 % (ref 12.3–15.4)

## 2013-03-07 LAB — SPECIMEN STATUS REPORT

## 2013-03-15 LAB — SPECIMEN STATUS REPORT

## 2013-03-15 LAB — TSH: TSH: 3.3 u[IU]/mL (ref 0.450–4.500)

## 2013-03-16 ENCOUNTER — Encounter: Payer: BC Managed Care – PPO | Admitting: Physician Assistant

## 2013-03-28 ENCOUNTER — Ambulatory Visit: Payer: BC Managed Care – PPO | Admitting: Internal Medicine

## 2013-03-28 ENCOUNTER — Other Ambulatory Visit: Payer: Self-pay | Admitting: Internal Medicine

## 2013-03-29 ENCOUNTER — Ambulatory Visit (INDEPENDENT_AMBULATORY_CARE_PROVIDER_SITE_OTHER): Payer: BC Managed Care – PPO | Admitting: Physician Assistant

## 2013-03-29 DIAGNOSIS — R9439 Abnormal result of other cardiovascular function study: Secondary | ICD-10-CM

## 2013-03-29 DIAGNOSIS — R0789 Other chest pain: Secondary | ICD-10-CM

## 2013-03-29 DIAGNOSIS — R002 Palpitations: Secondary | ICD-10-CM

## 2013-03-29 NOTE — Patient Instructions (Signed)
Your physician has requested that you have en exercise stress myoview. For further information please visit [email or web address]. Please follow instruction sheet, as given.   

## 2013-03-29 NOTE — Progress Notes (Signed)
Exercise Treadmill Test Sharon Byrd is a 62 y.o. female ex-smoker with hx of HL and FHx of CAD referred by PCP for exertional CP and SOB.  No syncope.  Symptoms typically occur after eating.  Exam unremarkable.  ECG: normal.  Pre-Exercise Testing Evaluation Rhythm: normal sinus  Rate: 73     Test  Exercise Tolerance Test Ordering MD: Hillis Range, MD  Interpreting MD: Tereso Newcomer PA-C  Unique Test No: 1  Treadmill:  1  Indication for ETT: chest tightness/palps  Contraindication to ETT: No   Stress Modality: exercise - treadmill  Cardiac Imaging Performed: non   Protocol: standard Bruce - maximal  Max BP:  172/68  Max MPHR (bpm):  157 85% MPR (bpm):  133  MPHR obtained (bpm):  164 % MPHR obtained:  104  Reached 85% MPHR (min:sec):  5:01 Total Exercise Time (min-sec):  8:00  Workload in METS:  10 Borg Scale: 19  Reason ETT Terminated:  desired heart rate attained    ST Segment Analysis At Rest: normal ST segments - no evidence of significant ST depression With Exercise: borderline ST changes  Other Information Arrhythmia:  No Angina during ETT:  present (1) Quality of ETT:  indeterminate  ETT Interpretation:  borderline (indeterminate) with non-specific ST changes  Comments: Good exercise capacity. Patient did complain of chest pain at end of exercise. Normal BP response to exercise. Borderline ischemic ST depression at peak exercise with some persistence into recovery.   Recommendations: We will schedule an ETT-Myoview. Patient will reschedule her colonoscopy planned for next week. Signed, Tereso Newcomer, PA-C   03/29/2013 10:00 AM

## 2013-04-14 HISTORY — PX: WRIST FRACTURE SURGERY: SHX121

## 2013-04-14 HISTORY — PX: COLONOSCOPY, DIAGNOSTIC (SCREENING): SHX174

## 2013-04-16 ENCOUNTER — Ambulatory Visit
Admission: RE | Admit: 2013-04-16 | Discharge: 2013-04-16 | Disposition: A | Discharge status: Home / Self Care | Payer: BC Managed Care – PPO | Source: Ambulatory Visit | Attending: Orthopedic Surgery | Admitting: Orthopedic Surgery

## 2013-04-16 ENCOUNTER — Other Ambulatory Visit: Payer: Self-pay | Admitting: Orthopedic Surgery

## 2013-04-16 ENCOUNTER — Ambulatory Visit (HOSPITAL_COMMUNITY): Payer: BC Managed Care – PPO | Attending: Internal Medicine | Admitting: Radiology

## 2013-04-16 VITALS — BP 133/76 | HR 89 | Ht 65.0 in | Wt 171.0 lb

## 2013-04-16 DIAGNOSIS — R9439 Abnormal result of other cardiovascular function study: Secondary | ICD-10-CM

## 2013-04-16 DIAGNOSIS — R0789 Other chest pain: Secondary | ICD-10-CM

## 2013-04-16 DIAGNOSIS — Z8249 Family history of ischemic heart disease and other diseases of the circulatory system: Secondary | ICD-10-CM | POA: Insufficient documentation

## 2013-04-16 DIAGNOSIS — R079 Chest pain, unspecified: Secondary | ICD-10-CM

## 2013-04-16 DIAGNOSIS — Z01810 Encounter for preprocedural cardiovascular examination: Secondary | ICD-10-CM | POA: Insufficient documentation

## 2013-04-16 DIAGNOSIS — Z87891 Personal history of nicotine dependence: Secondary | ICD-10-CM | POA: Insufficient documentation

## 2013-04-16 DIAGNOSIS — R0602 Shortness of breath: Secondary | ICD-10-CM

## 2013-04-16 DIAGNOSIS — E785 Hyperlipidemia, unspecified: Secondary | ICD-10-CM | POA: Insufficient documentation

## 2013-04-16 DIAGNOSIS — M25532 Pain in left wrist: Secondary | ICD-10-CM

## 2013-04-16 DIAGNOSIS — N189 Chronic kidney disease, unspecified: Secondary | ICD-10-CM | POA: Insufficient documentation

## 2013-04-16 DIAGNOSIS — R0989 Other specified symptoms and signs involving the circulatory and respiratory systems: Secondary | ICD-10-CM | POA: Insufficient documentation

## 2013-04-16 DIAGNOSIS — R0609 Other forms of dyspnea: Secondary | ICD-10-CM | POA: Insufficient documentation

## 2013-04-16 DIAGNOSIS — R002 Palpitations: Secondary | ICD-10-CM | POA: Insufficient documentation

## 2013-04-16 MED ORDER — TECHNETIUM TC 99M SESTAMIBI GENERIC - CARDIOLITE
10.0000 | Freq: Once | INTRAVENOUS | Status: AC | PRN
  Administered 2013-04-16: 10 via INTRAVENOUS

## 2013-04-16 MED ORDER — TECHNETIUM TC 99M SESTAMIBI GENERIC - CARDIOLITE
30.0000 | Freq: Once | INTRAVENOUS | Status: AC | PRN
  Administered 2013-04-16: 30 via INTRAVENOUS

## 2013-04-16 MED ORDER — REGADENOSON 0.4 MG/5ML IV SOLN
0.4000 mg | Freq: Once | INTRAVENOUS | Status: AC
  Administered 2013-04-16: 0.4 mg via INTRAVENOUS

## 2013-04-16 NOTE — Progress Notes (Signed)
[facility] SITE 3 NUCLEAR MED [address] [place], [place] [address] [phone]    Cardiology Nuclear Med Study  Sharon Byrd is a 62 y.o. female     MRN : [id number]     DOB: 05-08-1951  Procedure Date: 04/16/2013  Nuclear Med Background Indication for Stress Test:  Evaluation for Ischemia, Pending Surgical Clearance: (L) wrist fracture surgery by Dr. Margarita Rana, and Pending clearance for colonoscopy History:  GXT 03/2013 N/S changes, Chronic kidney disease Cardiac Risk Factors: Strong, Premature Family History - CAD, History of Smoking, and Lipids  Symptoms:  Chest Tightness (last date of chest discomfort was 1-2 weeks ago), DOE and Palpitations   Nuclear Pre-Procedure Caffeine/Decaff Intake:  None > 12 hrs NPO After: 1:00pm yesterday   Lungs:  clear O2 Sat: 96% on room air. IV 0.9% NS with Angio Cath:  22g  IV Site: R Wrist x 1, tolerated well IV Started by:  Irean Hong, RN  Chest Size (in):  38 Cup Size: C  Height: 5\' 5"  (1.651 m)  Weight:  171 lb (77.565 kg)  BMI:  Body mass index is 28.46 kg/(m^2). Tech Comments:  Patient fell yesterday and fractured (L) wrist. Stress Cardiolite changed to a Lexiscan cardiolite.[name],RN    Nuclear Med Study 1 or 2 day study: 1 day  Stress Test Type:  Lexiscan  Reading MD: Olga Millers, MD  Order Authorizing Provider:  Oneal Grout, MD, and Tereso Newcomer, [facility]  Resting Radionuclide: Technetium 37m Sestamibi  Resting Radionuclide Dose: 11.0 mCi   Stress Radionuclide:  Technetium 56m Sestamibi  Stress Radionuclide Dose: 32.7 mCi           Stress Protocol Rest HR: 89 Stress HR: 134  Rest BP: 133/76 Stress BP: 133/63  Exercise Time (min): n/a METS: n/a   Predicted Max HR: 157 bpm % Max HR: 85.35 bpm Rate Pressure Product: 69629   Dose of Adenosine (mg):  n/a Dose of Lexiscan: 0.4 mg  Dose of Atropine (mg): n/a Dose of Dobutamine: n/a mcg/kg/min (at max HR)  Stress Test Technologist: Nelson Chimes, BS-ES  Nuclear Technologist:  Doyne Keel, CNMT     Rest Procedure:  Myocardial perfusion imaging was performed at rest 45 minutes following the intravenous administration of Technetium 48m Sestamibi. Rest ECG: NSR - Normal EKG  Stress Procedure:  The patient received IV Lexiscan 0.4 mg over 15-seconds.  Technetium 13m Sestamibi injected at 30-seconds.  Quantitative spect images were obtained after a 45 minute delay. During the infusion of Lexiscan, the patient had throat tightness, heachache, lightheadedness and aching legs.  BP dopped during recovery. Elevated patiens legs and her BP returned to normal. Other symptoms began to resolve with recovery.  Stress ECG: No significant ST segment change suggestive of ischemia.  QPS Raw Data Images:  Acquisition technically good; normal left ventricular size. Stress Images:  Normal homogeneous uptake in all areas of the myocardium. Rest Images:  Normal homogeneous uptake in all areas of the myocardium. Subtraction (SDS):  No evidence of ischemia. Transient Ischemic Dilatation (Normal <1.22):  1.07 Lung/Heart Ratio (Normal <0.45):  0.30  Quantitative Gated Spect Images QGS EDV:  51 ml QGS ESV:  09 ml  Impression Exercise Capacity:  Lexiscan with no exercise. BP Response:  Normal blood pressure response. Clinical Symptoms:  There is throat tightness ECG Impression:  No significant ST segment change suggestive of ischemia. Comparison with Prior Nuclear Study: No images to compare  Overall Impression:  Normal stress nuclear study.  LV Ejection Fraction: 83%.  LV Wall Motion:  NL LV Function; NL Wall Motion   Olga Millers

## 2013-04-17 ENCOUNTER — Telehealth: Payer: Self-pay | Admitting: *Deleted

## 2013-04-17 ENCOUNTER — Encounter: Payer: Self-pay | Admitting: Physician Assistant

## 2013-04-17 ENCOUNTER — Ambulatory Visit: Payer: BC Managed Care – PPO | Admitting: Internal Medicine

## 2013-04-17 NOTE — Telephone Encounter (Signed)
I lmom on cell # . I did then call the home # , a woman answered the phone I asked if she was Miss Giron she said she was not;  I verified the home # and she said yes but she has had # for 10 yrs did not know any [place]

## 2013-04-17 NOTE — Telephone Encounter (Signed)
lmom myoview normal;  

## 2013-04-18 ENCOUNTER — Encounter: Payer: Self-pay | Admitting: *Deleted

## 2013-04-23 ENCOUNTER — Other Ambulatory Visit: Payer: Self-pay | Admitting: Internal Medicine

## 2013-04-24 ENCOUNTER — Other Ambulatory Visit: Payer: Self-pay | Admitting: Internal Medicine

## 2013-05-08 ENCOUNTER — Other Ambulatory Visit: Payer: Self-pay | Admitting: Gastroenterology

## 2013-05-24 ENCOUNTER — Other Ambulatory Visit (HOSPITAL_COMMUNITY): Payer: Self-pay | Admitting: Gastroenterology

## 2013-05-24 DIAGNOSIS — R110 Nausea: Secondary | ICD-10-CM

## 2013-05-28 ENCOUNTER — Other Ambulatory Visit: Payer: Self-pay | Admitting: Internal Medicine

## 2013-06-01 ENCOUNTER — Encounter: Payer: Self-pay | Admitting: Internal Medicine

## 2013-06-01 ENCOUNTER — Other Ambulatory Visit: Payer: Self-pay | Admitting: Internal Medicine

## 2013-06-08 ENCOUNTER — Encounter (HOSPITAL_COMMUNITY)
Admission: RE | Admit: 2013-06-08 | Discharge: 2013-06-08 | Disposition: A | Discharge status: Home / Self Care | Payer: BC Managed Care – PPO | Source: Ambulatory Visit | Attending: Gastroenterology | Admitting: Gastroenterology

## 2013-06-08 DIAGNOSIS — R110 Nausea: Secondary | ICD-10-CM

## 2013-06-08 MED ORDER — TECHNETIUM TC 99M SULFUR COLLOID
2.0000 | Freq: Once | INTRAVENOUS | Status: AC | PRN
  Administered 2013-06-08: 2 via INTRAVENOUS

## 2013-10-01 ENCOUNTER — Other Ambulatory Visit: Payer: Self-pay | Admitting: *Deleted

## 2013-10-01 MED ORDER — DEXLANSOPRAZOLE 60 MG PO CPDR: DELAYED_RELEASE_CAPSULE | ORAL | Status: DC

## 2013-10-05 ENCOUNTER — Telehealth: Payer: Self-pay | Admitting: *Deleted

## 2013-10-05 NOTE — Telephone Encounter (Signed)
Received a Authorization Request for patient's Dexilant. Patient was suppose to follow up with Dr. Glade LloydPandey twice and canceled. Told her that I would be unable to do prior auth. Due to no appointment scheduled. Patient stated that I didn't have to refill and hung up

## 2013-10-21 ENCOUNTER — Other Ambulatory Visit: Payer: Self-pay | Admitting: Internal Medicine

## 2013-11-14 ENCOUNTER — Other Ambulatory Visit: Payer: Self-pay | Admitting: Internal Medicine

## 2013-11-14 NOTE — Telephone Encounter (Signed)
Due to have Vit D level check

## 2013-12-19 ENCOUNTER — Other Ambulatory Visit: Payer: Self-pay | Admitting: Internal Medicine

## 2013-12-22 ENCOUNTER — Other Ambulatory Visit: Payer: Self-pay | Admitting: Internal Medicine

## 2014-01-21 ENCOUNTER — Other Ambulatory Visit: Payer: Self-pay | Admitting: *Deleted

## 2014-01-21 MED ORDER — DEXLANSOPRAZOLE 60 MG PO CPDR: DELAYED_RELEASE_CAPSULE | ORAL | Status: DC

## 2014-01-21 NOTE — Telephone Encounter (Signed)
Patient Requested has an appointment November

## 2014-01-29 ENCOUNTER — Telehealth: Payer: Self-pay

## 2014-01-29 NOTE — Telephone Encounter (Signed)
Initiated PA, went to web-site  [email or web address], printed form for Dexilant, completed and faxed to [phone]. Awaiting response from insurance company to see if covered. PA form is in the triage area until response received, then to scanning.

## 2014-01-31 NOTE — Telephone Encounter (Signed)
Received fax from [facility] [place] and they denied patient's Dexilant due to it does not meet the definition of Medical Necessity found in the member's benefit.

## 2014-01-31 NOTE — Telephone Encounter (Signed)
Please advise 

## 2014-01-31 NOTE — Telephone Encounter (Signed)
i will have her on pantoprazole 40 mg daily for now until seen by GI then. Has she seen GI yet?

## 2014-02-04 MED ORDER — PANTOPRAZOLE SODIUM 40 MG PO TBEC: 40.0000 mg | DELAYED_RELEASE_TABLET | Freq: Every day | ORAL | Status: DC

## 2014-02-04 NOTE — Addendum Note (Signed)
Addended by: Maurice Small on: 02/04/2014 02:41 PM   Modules accepted: Orders

## 2014-02-04 NOTE — Telephone Encounter (Signed)
Patient aware of medication change. RX sent to pharmacy for 30 day supply, patient will she how she does for now and in the future get a 90 day supply. Patient seen GI back in February 2015 (see media tab).

## 2014-02-05 NOTE — Telephone Encounter (Signed)
Noted. She can continue this until she sees dr reed, see her sooner if needed

## 2014-02-10 ENCOUNTER — Other Ambulatory Visit: Payer: Self-pay | Admitting: Internal Medicine

## 2014-03-08 ENCOUNTER — Other Ambulatory Visit: Payer: Self-pay | Admitting: Internal Medicine

## 2014-04-10 ENCOUNTER — Other Ambulatory Visit: Payer: Self-pay | Admitting: Internal Medicine

## 2014-04-12 ENCOUNTER — Other Ambulatory Visit: Payer: Self-pay | Admitting: Internal Medicine

## 2014-04-18 ENCOUNTER — Encounter: Payer: Self-pay | Admitting: Internal Medicine

## 2014-04-18 ENCOUNTER — Encounter: Payer: Self-pay | Admitting: *Deleted

## 2014-04-18 ENCOUNTER — Ambulatory Visit (INDEPENDENT_AMBULATORY_CARE_PROVIDER_SITE_OTHER): Payer: BC Managed Care – PPO | Admitting: Internal Medicine

## 2014-04-18 VITALS — BP 130/60 | HR 88 | Temp 97.5°F | Ht 65.0 in | Wt 164.6 lb

## 2014-04-18 DIAGNOSIS — K219 Gastro-esophageal reflux disease without esophagitis: Secondary | ICD-10-CM

## 2014-04-18 DIAGNOSIS — E785 Hyperlipidemia, unspecified: Secondary | ICD-10-CM

## 2014-04-18 DIAGNOSIS — R7309 Other abnormal glucose: Secondary | ICD-10-CM

## 2014-04-18 DIAGNOSIS — R197 Diarrhea, unspecified: Secondary | ICD-10-CM

## 2014-04-18 DIAGNOSIS — R7303 Prediabetes: Secondary | ICD-10-CM

## 2014-04-18 DIAGNOSIS — Z0000 Encounter for general adult medical examination without abnormal findings: Secondary | ICD-10-CM

## 2014-04-18 DIAGNOSIS — N1831 Chronic kidney disease, stage 3a: Secondary | ICD-10-CM

## 2014-04-18 DIAGNOSIS — N183 Chronic kidney disease, stage 3 (moderate): Secondary | ICD-10-CM

## 2014-04-18 DIAGNOSIS — M8580 Other specified disorders of bone density and structure, unspecified site: Secondary | ICD-10-CM | POA: Insufficient documentation

## 2014-04-18 DIAGNOSIS — H6123 Impacted cerumen, bilateral: Secondary | ICD-10-CM

## 2014-04-18 DIAGNOSIS — M810 Age-related osteoporosis without current pathological fracture: Secondary | ICD-10-CM | POA: Insufficient documentation

## 2014-04-18 MED ORDER — DEXLANSOPRAZOLE 60 MG PO CPDR: 60.0000 mg | DELAYED_RELEASE_CAPSULE | Freq: Every day | ORAL | Status: DC

## 2014-04-18 MED ORDER — CARBAMIDE PEROXIDE 6.5 % OT SOLN: 5.0000 [drp] | Freq: Two times a day (BID) | OTIC | Status: DC

## 2014-04-18 NOTE — Patient Instructions (Signed)
Please call your insurance company to see if they want you to have your zostavax (shingles vaccine) here in the office or at the pharmacy and if they will cover the cost.

## 2014-04-18 NOTE — Progress Notes (Signed)
Patient ID: Sharon Byrd, female   DOB: 1950/11/09, 63 y.o.   MRN: [id number]   [facility]  Code Status: plans to complete living will and hcpoa--short form was provided today and reviewed with her--she will bring us a copy  Allergies  Allergen Reactions  . Metrogel [Metronidazole]     Chief Complaint  Patient presents with  . Annual Exam    Yearly physical    HPI: Patient is a 63 y.o. white female seen in the office today to change providers and have her annual exam.  She is out of town on Tuesdays and Wednesdays which are the days that Dr. Glade LloydPandey is here in the office so she has had to switch to seeing me.    She c/o increased bms for about 2 months.  Has been worse in past couple of days.  Thought she had food poisoning.  Usually BMs are daily in the morning.  Past month or so, going 2-3 times per day--formed, but soft.  Has been taking probiotic now for about 2 wks.  Abdominal pain on left side, dull, there off and on.  Has tendency for nausea.  Had EGD done due to GERD.  Had been on ranitidine for it.    Acid reflux--protonix is not lasting 24 hrs.  Insurance would not cover dexilant.  Had been taking off protonix b/c of bone mass loss.  Is noticing her acid coming up, but could be the chocolate she's eaten.    Went to ENT due to pain on right side of throat.  Was told it was acid reflux.  May also have developed some allergies in the summertime.  Helped with zyrtec.  Did not tolerate coming off a month later so afraid to try again.    Loose stools started after protonix begun.  Previously tried nexium also w/o relief.    Has small ear canals and gets earwax buildup.  Has not had labs in a long time.    Review of Systems:  Review of Systems  Constitutional: Negative for fever and chills.  HENT: Negative for hearing loss.        Cerumen impaction  Eyes: Negative for blurred vision.  Respiratory: Negative for  shortness of breath.   Cardiovascular: Negative for chest pain and leg swelling.  Gastrointestinal: Positive for heartburn, nausea, abdominal pain and diarrhea. Negative for vomiting, constipation, blood in stool and melena.  Genitourinary: Negative for dysuria, urgency and frequency.  Musculoskeletal: Negative for myalgias, joint pain and falls.  Skin: Negative for rash.  Neurological: Negative for dizziness, loss of consciousness and headaches.  Endo/Heme/Allergies: Does not bruise/bleed easily.  Psychiatric/Behavioral: Negative for depression and memory loss.    Past Medical History  Diagnosis Date  . Obesity, unspecified   . Chronic kidney disease, stage III (moderate)   . Disorder of bone and cartilage, unspecified   . Other malaise and fatigue   . Cramp of limb   . Enlargement of lymph nodes   . Unspecified vitamin D deficiency   . Diaphragmatic hernia without mention of obstruction or gangrene   . Congenital pigmentary anomaly of skin   . Other abnormal glucose   . Personal history of colonic polyps   . Osteoarthrosis, unspecified whether generalized or localized, unspecified site   . Other and unspecified hyperlipidemia   . Unspecified essential hypertension   . Reflux esophagitis   . Unspecified constipation   . Rosacea   . Pain  in joint, site unspecified   . Lumbago   . Chest pain, unspecified     LexiScan Myoview (11/14): No ischemia, normal study, EF 83%  . Palpitation     Past Surgical History  Procedure Laterality Date  . Back surgery  1986    Social History:   reports that she quit smoking about 32 years ago. Her smoking use included Cigarettes. She smoked 0.00 packs per day for 12 years. She does not have any smokeless tobacco history on file. She reports that she drinks alcohol. She reports that she does not use illicit drugs.  History reviewed. No pertinent family history.  Medications: Patient's Medications  New Prescriptions   No medications on  file  Previous Medications   AZELAIC ACID (FINACEA EX)    Apply topically. Use every other day for Rosacea   CALCIUM CARBONATE-VITAMIN D (CALCIUM-VITAMIN D) 500-200 MG-UNIT PER TABLET    Take 1 tablet by mouth 2 (two) times daily with a meal.   CARBAMIDE PEROXIDE (DEBROX) 6.5 % OTIC SOLUTION    Place 5 drops into the right ear 2 (two) times daily.   PANTOPRAZOLE (PROTONIX) 40 MG TABLET    TAKE 1 TABLET BY MOUTH EVERY DAY   SIMVASTATIN (ZOCOR) 40 MG TABLET    LABS OVERDUE, 1 by mouth daily for cholesterol  Modified Medications   No medications on file  Discontinued Medications   RANITIDINE (ZANTAC) 150 MG TABLET    TAKE 1 TABLET TWICE A DAY   RANITIDINE (ZANTAC) 150 MG TABLET    TAKE 1 TABLET TWICE A DAY   VITAMIN D, ERGOCALCIFEROL, (DRISDOL) 50000 UNITS CAPS CAPSULE    TAKE ONE CAPSULE BY MOUTH ONCE A WEEK     Physical Exam: Filed Vitals:   04/18/14 1344  BP: 130/60  Pulse: 88  Temp: 97.5 F (36.4 C)  TempSrc: Oral  Height: 5\' 5"  (1.651 m)  Weight: 164 lb 9.6 oz (74.662 kg)  SpO2: 98%  Physical Exam  Constitutional: She is oriented to person, place, and time. She appears well-developed and well-nourished. No distress.  HENT:  Head: Normocephalic and atraumatic.  Right Ear: External ear normal.  Left Ear: External ear normal.  Nose: Nose normal.  Mouth/Throat: No oropharyngeal exudate.  Bilateral cerumen impaction  Eyes: Conjunctivae and EOM are normal. Pupils are equal, round, and reactive to light.  Neck: Normal range of motion. Neck supple. No JVD present. No tracheal deviation present. No thyromegaly present.  Cardiovascular: Normal rate, regular rhythm, normal heart sounds and intact distal pulses.   Pulmonary/Chest: Effort normal and breath sounds normal. No respiratory distress. Right breast exhibits no inverted nipple, no mass, no nipple discharge, no skin change and no tenderness. Left breast exhibits no inverted nipple, no mass, no nipple discharge, no skin change and  no tenderness.  Abdominal: Soft. Bowel sounds are normal. She exhibits no distension and no mass. There is no tenderness.  Musculoskeletal: Normal range of motion. She exhibits no edema or tenderness.  Lymphadenopathy:    She has no cervical adenopathy.  Neurological: She is alert and oriented to person, place, and time. She has normal reflexes.  Skin: Skin is warm and dry. There is pallor.  Psychiatric: She has a normal mood and affect. Her behavior is normal. Judgment and thought content normal.    Labs reviewed:  Lab Results  Component Value Date   HGBA1C 5.8* 02/19/2013   Assessment/Plan 1. Gastroesophageal reflux disease without esophagitis -worsened since on protonix -also has frequent loose stools  since she's been on protonix -use zantac twice a day until dexilant approved - dexlansoprazole (DEXILANT) 60 MG capsule; Take 1 capsule (60 mg total) by mouth daily.  Dispense: 30 capsule; Refill: 3  2. Frequent loose stools -? Eosinophilic enteritis from protonix -will try off protonix - Comprehensive metabolic panel; Future - CBC With differential/Platelet; Future - TSH; Future  3. Prediabetes - f/u labs -is trying to exercise some with swimming - Hemoglobin A1c; Future  4. Chronic kidney disease (CKD) stage G3a/A2, moderately decreased glomerular filtration rate (GFR) between 45-59 mL/min/1.73 square meter and albuminuria creatinine ratio between 30-299 mg/g -f/u cmp -avoid nsaids  5. Hyperlipidemia LDL goal <160 - cont zocor - Lipid panel; Future  6. Osteopenia -need to obtain from [facility] last bone density (also last mm)  7. Routine general medical examination at a health care facility -obtain copy of last [facility] mm, bone density, is due for her mm and going to get 3D--advised to please schedule--is to have annually -had scope 11/14 -did not get flu shot -does not yet need pneumonia shots until next year -sees eye doctor regularly, dentist and ENT  8. Cerumen  impaction, bilateral -due to small canals, refill debrox - carbamide peroxide (DEBROX) 6.5 % otic solution; Place 5 drops into the right ear 2 (two) times daily.  Dispense: 15 mL; Refill: 0    Labs/tests ordered:   Orders Placed This Encounter  Procedures  . Comprehensive metabolic panel    Standing Status: Future     Number of Occurrences:      Standing Expiration Date: 05/18/2014    Order Specific Question:  Has the patient fasted?    Answer:  Yes  . CBC With differential/Platelet    Standing Status: Future     Number of Occurrences:      Standing Expiration Date: 05/18/2014  . Lipid panel    Standing Status: Future     Number of Occurrences:      Standing Expiration Date: 05/18/2014    Order Specific Question:  Has the patient fasted?    Answer:  Yes  . Hemoglobin A1c    Standing Status: Future     Number of Occurrences:      Standing Expiration Date: 05/18/2014  . TSH    Standing Status: Future     Number of Occurrences:      Standing Expiration Date: 05/18/2014    Next appt:  2 mos   Jakyle Petrucelli L. Marolyn Urschel, D.O. Geriatrics [facility] [address], [place] [address] Cell Phone (Mon-Fri 8am-5pm):  [phone] On Call:  [phone] & follow prompts after 5pm & weekends Office Phone:  [phone] Office Fax:  [phone]

## 2014-04-19 ENCOUNTER — Other Ambulatory Visit: Payer: BC Managed Care – PPO

## 2014-04-19 DIAGNOSIS — E785 Hyperlipidemia, unspecified: Secondary | ICD-10-CM

## 2014-04-19 DIAGNOSIS — R197 Diarrhea, unspecified: Secondary | ICD-10-CM

## 2014-04-19 DIAGNOSIS — R7303 Prediabetes: Secondary | ICD-10-CM

## 2014-04-20 LAB — COMPREHENSIVE METABOLIC PANEL
ALT: 22 IU/L (ref 0–32)
AST: 23 IU/L (ref 0–40)
Albumin/Globulin Ratio: 2 (ref 1.1–2.5)
Albumin: 4.4 g/dL (ref 3.6–4.8)
Alkaline Phosphatase: 71 IU/L (ref 39–117)
BUN/Creatinine Ratio: 12 (ref 11–26)
BUN: 14 mg/dL (ref 8–27)
CO2: 26 mmol/L (ref 18–29)
Calcium: 9.5 mg/dL (ref 8.7–10.3)
Chloride: 100 mmol/L (ref 97–108)
Creatinine, Ser: 1.16 mg/dL — ABNORMAL HIGH (ref 0.57–1.00)
GFR calc Af Amer: 57 mL/min/{1.73_m2} — ABNORMAL LOW (ref >59)
GFR calc non Af Amer: 50 mL/min/{1.73_m2} — ABNORMAL LOW (ref >59)
Globulin, Total: 2.2 g/dL (ref 1.5–4.5)
Glucose: 97 mg/dL (ref 65–99)
Potassium: 4.4 mmol/L (ref 3.5–5.2)
Sodium: 139 mmol/L (ref 134–144)
Total Bilirubin: 1.1 mg/dL (ref 0.0–1.2)
Total Protein: 6.6 g/dL (ref 6.0–8.5)

## 2014-04-20 LAB — LIPID PANEL
Chol/HDL Ratio: 2.8 ratio units (ref 0.0–4.4)
Cholesterol, Total: 167 mg/dL (ref 100–199)
HDL: 59 mg/dL (ref >39)
LDL Calculated: 86 mg/dL (ref 0–99)
Triglycerides: 108 mg/dL (ref 0–149)
VLDL Cholesterol Cal: 22 mg/dL (ref 5–40)

## 2014-04-20 LAB — HEMOGLOBIN A1C
Est. average glucose Bld gHb Est-mCnc: 123 mg/dL
Hgb A1c MFr Bld: 5.9 % — ABNORMAL HIGH (ref 4.8–5.6)

## 2014-04-20 LAB — CBC WITH DIFFERENTIAL
Basophils Absolute: 0 10*3/uL (ref 0.0–0.2)
Basos: 1 %
Eos: 2 %
Eosinophils Absolute: 0.1 10*3/uL (ref 0.0–0.4)
HCT: 40.4 % (ref 34.0–46.6)
Hemoglobin: 13.9 g/dL (ref 11.1–15.9)
Immature Grans (Abs): 0 10*3/uL (ref 0.0–0.1)
Immature Granulocytes: 0 %
Lymphocytes Absolute: 1.5 10*3/uL (ref 0.7–3.1)
Lymphs: 25 %
MCH: 29.3 pg (ref 26.6–33.0)
MCHC: 34.4 g/dL (ref 31.5–35.7)
MCV: 85 fL (ref 79–97)
Monocytes Absolute: 0.5 10*3/uL (ref 0.1–0.9)
Monocytes: 9 %
Neutrophils Absolute: 3.6 10*3/uL (ref 1.4–7.0)
Neutrophils Relative %: 63 %
Platelets: 251 10*3/uL (ref 150–379)
RBC: 4.74 x10E6/uL (ref 3.77–5.28)
RDW: 14.1 % (ref 12.3–15.4)
WBC: 5.7 10*3/uL (ref 3.4–10.8)

## 2014-04-20 LAB — TSH: TSH: 5.28 u[IU]/mL — ABNORMAL HIGH (ref 0.450–4.500)

## 2014-04-24 ENCOUNTER — Encounter: Payer: Self-pay | Admitting: Internal Medicine

## 2014-05-15 ENCOUNTER — Other Ambulatory Visit: Payer: Self-pay | Admitting: Internal Medicine

## 2014-06-14 HISTORY — PX: ESOPHAGOSCOPY, RIGID, TRANSORAL: SHX510245

## 2014-07-04 ENCOUNTER — Ambulatory Visit: Payer: BC Managed Care – PPO | Admitting: Internal Medicine

## 2014-07-11 ENCOUNTER — Telehealth: Payer: Self-pay

## 2014-07-11 NOTE — Telephone Encounter (Signed)
Received certification BC/BS form to be completed by Dr. Renato Gailseed. Put in her folder.

## 2014-07-11 NOTE — Telephone Encounter (Signed)
Called [employer]/BS [phone] to fax authorization form to office to be completed.

## 2014-07-16 NOTE — Telephone Encounter (Signed)
Received determination from [facility] [phone] for Dexilant and request was DENIED. Given to Dr. Renato Gailseed to review.

## 2014-07-26 LAB — HM MAMMOGRAPHY

## 2014-07-29 ENCOUNTER — Encounter: Payer: Self-pay | Admitting: *Deleted

## 2014-08-01 ENCOUNTER — Telehealth: Payer: Self-pay

## 2014-08-01 NOTE — Telephone Encounter (Signed)
I believe I wrote a response on there that said try protonix when it was in the chart folder--I wonder what happened to that.  Protonix 40mg  po daily before breakfast.

## 2014-08-01 NOTE — Telephone Encounter (Signed)
Patient left triage voicemail message requesting that we complete a PA for Dexilant  Return call to patient; patient informed that we completed PA on Jul 11, 2014 and it was DENIED. Based on previous phone note this information was given to Dr. Renato Gailseed to review.  Please advise on alternative

## 2014-08-02 MED ORDER — SIMVASTATIN 40 MG PO TABS: ORAL_TABLET | ORAL | Status: DC

## 2014-08-02 MED ORDER — VITAMIN D (ERGOCALCIFEROL) 1.25 MG (50000 UNIT) PO CAPS: 50000.0000 [IU] | ORAL_CAPSULE | ORAL | Status: DC

## 2014-08-02 MED ORDER — OMEPRAZOLE 40 MG PO CPDR
40.0000 mg | DELAYED_RELEASE_CAPSULE | Freq: Every day | ORAL | Status: DC
Start: 2014-08-02 — End: 2014-10-03

## 2014-08-02 NOTE — Telephone Encounter (Signed)
Protonix caused patient to have loose stool

## 2014-08-02 NOTE — Telephone Encounter (Signed)
Spoke with patient, patient never tried omeprazole. Per verbal from Dr.Reed omeprazole 40 mg once daily before breakfast

## 2014-08-02 NOTE — Telephone Encounter (Signed)
Has she tried omeprazole or esomeprazole?  If she does not want to take any of these alternatives, she may have to pay for the dexilant since it was not approved by the insurance company for coverage.

## 2014-08-07 ENCOUNTER — Encounter: Payer: Self-pay | Admitting: Internal Medicine

## 2014-08-08 ENCOUNTER — Other Ambulatory Visit: Payer: BC Managed Care – PPO

## 2014-08-12 ENCOUNTER — Ambulatory Visit: Payer: BC Managed Care – PPO | Admitting: Internal Medicine

## 2014-10-03 ENCOUNTER — Ambulatory Visit: Payer: Self-pay | Admitting: Internal Medicine

## 2014-10-03 ENCOUNTER — Other Ambulatory Visit: Payer: Self-pay | Admitting: *Deleted

## 2014-10-03 ENCOUNTER — Encounter: Payer: Self-pay | Admitting: Internal Medicine

## 2014-10-03 DIAGNOSIS — Z0289 Encounter for other administrative examinations: Secondary | ICD-10-CM

## 2014-10-03 MED ORDER — OMEPRAZOLE 40 MG PO CPDR: 40.0000 mg | DELAYED_RELEASE_CAPSULE | Freq: Every day | ORAL | Status: DC

## 2014-10-03 NOTE — Telephone Encounter (Signed)
[facility] [address] 

## 2014-10-04 ENCOUNTER — Ambulatory Visit: Payer: Self-pay | Admitting: Internal Medicine

## 2014-10-07 ENCOUNTER — Ambulatory Visit: Payer: Self-pay | Admitting: Internal Medicine

## 2014-10-13 DIAGNOSIS — S62609A Fracture of unspecified phalanx of unspecified finger, initial encounter for closed fracture: Secondary | ICD-10-CM

## 2014-10-13 HISTORY — DX: Fracture of unspecified phalanx of unspecified finger, initial encounter for closed fracture: S62.609A

## 2014-10-18 ENCOUNTER — Ambulatory Visit: Payer: Self-pay | Admitting: Internal Medicine

## 2014-10-19 IMAGING — CT CT 3D INDEPENDENT WKST
1 of 8 series · 2 of 14 positions shown, 3 images · non-contrast
Comparison: None.

CLINICAL DATA: Left wrist pain secondary to a fall.

EXAM:
CT OF THE LEFT WRIST WITHOUT CONTRAST; 3-DIMENSIONAL CT IMAGE
RENDERING ON INDEPENDENT WORKSTATION
TECHNIQUE: Multidetector CT imaging was performed according to the standard
protocol. Multiplanar CT image reconstructions were also generated.;
3-dimensional CT images were rendered by post-processing of the
original CT data on an independent workstation. The 3-dimensional CT
images were interpreted and findings were reported in the
accompanying complete CT report for this study

[Series 402: axial bone · axial · 0.25mm/px · z∈[-70,+47]mm · 2 of 42 slices shown, 3 images]
[im 1/42  soft-tissue]
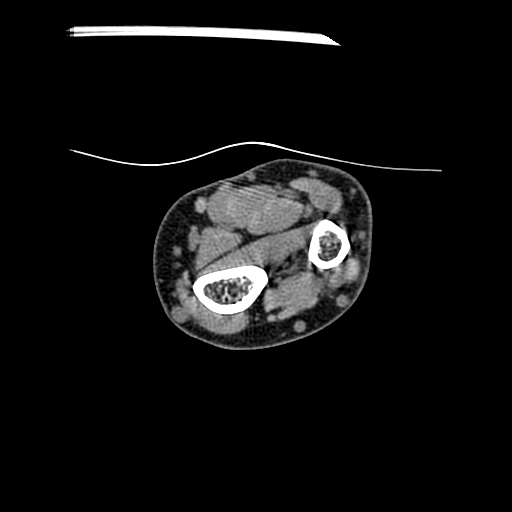
[im 1/42  bone]
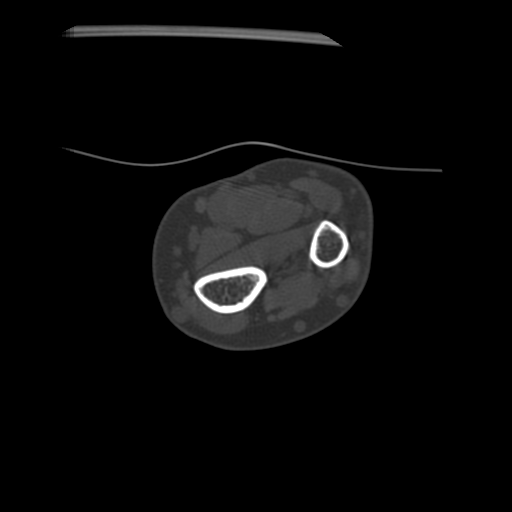
[im 42/42  bone]
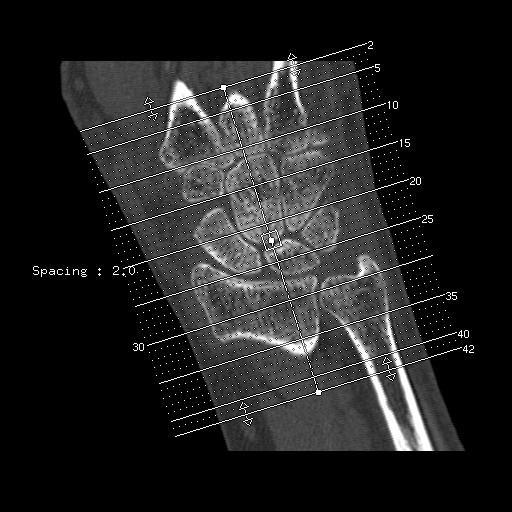

[2 of 14 positions shown; findings below may reference images not displayed]

FINDINGS: There is a hairline nondisplaced fracture through the mid scaphoid.
There are effusions in the radiocarpal joint and midcarpal joint
secondary to the fracture. The other bones appear normal. Tendons
appear normal. Distal radius and ulna are normal.
IMPRESSION: Nondisplaced hairline fracture of the mid scaphoid. 3D images do not
demonstrate this subtle fracture.

## 2014-11-08 DIAGNOSIS — M79641 Pain in right hand: Secondary | ICD-10-CM | POA: Diagnosis not present

## 2014-11-27 DIAGNOSIS — M79641 Pain in right hand: Secondary | ICD-10-CM | POA: Diagnosis not present

## 2014-12-20 DIAGNOSIS — M79641 Pain in right hand: Secondary | ICD-10-CM | POA: Diagnosis not present

## 2014-12-20 DIAGNOSIS — S62601A Fracture of unspecified phalanx of left index finger, initial encounter for closed fracture: Secondary | ICD-10-CM | POA: Diagnosis not present

## 2014-12-27 DIAGNOSIS — M25641 Stiffness of right hand, not elsewhere classified: Secondary | ICD-10-CM | POA: Diagnosis not present

## 2014-12-27 DIAGNOSIS — S6290XA Unspecified fracture of unspecified wrist and hand, initial encounter for closed fracture: Secondary | ICD-10-CM | POA: Diagnosis not present

## 2014-12-27 DIAGNOSIS — M6281 Muscle weakness (generalized): Secondary | ICD-10-CM | POA: Diagnosis not present

## 2014-12-27 DIAGNOSIS — M25441 Effusion, right hand: Secondary | ICD-10-CM | POA: Diagnosis not present

## 2015-01-03 DIAGNOSIS — M6281 Muscle weakness (generalized): Secondary | ICD-10-CM | POA: Diagnosis not present

## 2015-01-03 DIAGNOSIS — M25641 Stiffness of right hand, not elsewhere classified: Secondary | ICD-10-CM | POA: Diagnosis not present

## 2015-01-03 DIAGNOSIS — M25441 Effusion, right hand: Secondary | ICD-10-CM | POA: Diagnosis not present

## 2015-01-03 DIAGNOSIS — S6290XA Unspecified fracture of unspecified wrist and hand, initial encounter for closed fracture: Secondary | ICD-10-CM | POA: Diagnosis not present

## 2015-01-05 ENCOUNTER — Other Ambulatory Visit: Payer: Self-pay | Admitting: Internal Medicine

## 2015-01-06 ENCOUNTER — Other Ambulatory Visit: Payer: Self-pay

## 2015-01-06 MED ORDER — SIMVASTATIN 40 MG PO TABS: ORAL_TABLET | ORAL | Status: DC

## 2015-01-10 DIAGNOSIS — S6290XA Unspecified fracture of unspecified wrist and hand, initial encounter for closed fracture: Secondary | ICD-10-CM | POA: Diagnosis not present

## 2015-01-10 DIAGNOSIS — M6281 Muscle weakness (generalized): Secondary | ICD-10-CM | POA: Diagnosis not present

## 2015-01-10 DIAGNOSIS — M25641 Stiffness of right hand, not elsewhere classified: Secondary | ICD-10-CM | POA: Diagnosis not present

## 2015-01-10 DIAGNOSIS — M25441 Effusion, right hand: Secondary | ICD-10-CM | POA: Diagnosis not present

## 2015-01-30 DIAGNOSIS — M6281 Muscle weakness (generalized): Secondary | ICD-10-CM | POA: Diagnosis not present

## 2015-01-30 DIAGNOSIS — M25441 Effusion, right hand: Secondary | ICD-10-CM | POA: Diagnosis not present

## 2015-01-30 DIAGNOSIS — S6290XA Unspecified fracture of unspecified wrist and hand, initial encounter for closed fracture: Secondary | ICD-10-CM | POA: Diagnosis not present

## 2015-01-30 DIAGNOSIS — M25641 Stiffness of right hand, not elsewhere classified: Secondary | ICD-10-CM | POA: Diagnosis not present

## 2015-02-14 DIAGNOSIS — M25641 Stiffness of right hand, not elsewhere classified: Secondary | ICD-10-CM | POA: Diagnosis not present

## 2015-02-14 DIAGNOSIS — M6281 Muscle weakness (generalized): Secondary | ICD-10-CM | POA: Diagnosis not present

## 2015-02-14 DIAGNOSIS — M25441 Effusion, right hand: Secondary | ICD-10-CM | POA: Diagnosis not present

## 2015-02-14 DIAGNOSIS — S6290XA Unspecified fracture of unspecified wrist and hand, initial encounter for closed fracture: Secondary | ICD-10-CM | POA: Diagnosis not present

## 2015-02-19 DIAGNOSIS — S6290XA Unspecified fracture of unspecified wrist and hand, initial encounter for closed fracture: Secondary | ICD-10-CM | POA: Diagnosis not present

## 2015-02-19 DIAGNOSIS — M25441 Effusion, right hand: Secondary | ICD-10-CM | POA: Diagnosis not present

## 2015-02-19 DIAGNOSIS — M6281 Muscle weakness (generalized): Secondary | ICD-10-CM | POA: Diagnosis not present

## 2015-02-19 DIAGNOSIS — M25641 Stiffness of right hand, not elsewhere classified: Secondary | ICD-10-CM | POA: Diagnosis not present

## 2015-02-21 DIAGNOSIS — S6290XA Unspecified fracture of unspecified wrist and hand, initial encounter for closed fracture: Secondary | ICD-10-CM | POA: Diagnosis not present

## 2015-02-21 DIAGNOSIS — M25441 Effusion, right hand: Secondary | ICD-10-CM | POA: Diagnosis not present

## 2015-02-21 DIAGNOSIS — M25641 Stiffness of right hand, not elsewhere classified: Secondary | ICD-10-CM | POA: Diagnosis not present

## 2015-02-21 DIAGNOSIS — M6281 Muscle weakness (generalized): Secondary | ICD-10-CM | POA: Diagnosis not present

## 2015-02-24 DIAGNOSIS — M6281 Muscle weakness (generalized): Secondary | ICD-10-CM | POA: Diagnosis not present

## 2015-02-24 DIAGNOSIS — M25641 Stiffness of right hand, not elsewhere classified: Secondary | ICD-10-CM | POA: Diagnosis not present

## 2015-02-24 DIAGNOSIS — S6290XA Unspecified fracture of unspecified wrist and hand, initial encounter for closed fracture: Secondary | ICD-10-CM | POA: Diagnosis not present

## 2015-02-24 DIAGNOSIS — M25441 Effusion, right hand: Secondary | ICD-10-CM | POA: Diagnosis not present

## 2015-02-26 DIAGNOSIS — M25641 Stiffness of right hand, not elsewhere classified: Secondary | ICD-10-CM | POA: Diagnosis not present

## 2015-02-26 DIAGNOSIS — S6290XA Unspecified fracture of unspecified wrist and hand, initial encounter for closed fracture: Secondary | ICD-10-CM | POA: Diagnosis not present

## 2015-02-26 DIAGNOSIS — M25441 Effusion, right hand: Secondary | ICD-10-CM | POA: Diagnosis not present

## 2015-02-26 DIAGNOSIS — M6281 Muscle weakness (generalized): Secondary | ICD-10-CM | POA: Diagnosis not present

## 2015-03-07 ENCOUNTER — Encounter: Payer: Self-pay | Admitting: Internal Medicine

## 2015-03-07 ENCOUNTER — Ambulatory Visit (INDEPENDENT_AMBULATORY_CARE_PROVIDER_SITE_OTHER): Payer: Medicare Other | Admitting: Internal Medicine

## 2015-03-07 VITALS — BP 128/80 | HR 80 | Temp 98.4°F | Ht 65.0 in | Wt 174.0 lb

## 2015-03-07 DIAGNOSIS — E785 Hyperlipidemia, unspecified: Secondary | ICD-10-CM | POA: Diagnosis not present

## 2015-03-07 DIAGNOSIS — K219 Gastro-esophageal reflux disease without esophagitis: Secondary | ICD-10-CM | POA: Diagnosis not present

## 2015-03-07 DIAGNOSIS — M8580 Other specified disorders of bone density and structure, unspecified site: Secondary | ICD-10-CM | POA: Diagnosis not present

## 2015-03-07 DIAGNOSIS — R7309 Other abnormal glucose: Secondary | ICD-10-CM | POA: Diagnosis not present

## 2015-03-07 DIAGNOSIS — R197 Diarrhea, unspecified: Secondary | ICD-10-CM

## 2015-03-07 DIAGNOSIS — Z23 Encounter for immunization: Secondary | ICD-10-CM

## 2015-03-07 DIAGNOSIS — R7303 Prediabetes: Secondary | ICD-10-CM

## 2015-03-07 DIAGNOSIS — N183 Chronic kidney disease, stage 3 (moderate): Secondary | ICD-10-CM

## 2015-03-07 DIAGNOSIS — N1831 Chronic kidney disease, stage 3a: Secondary | ICD-10-CM

## 2015-03-07 MED ORDER — RANITIDINE HCL 150 MG PO TABS: 150.0000 mg | ORAL_TABLET | Freq: Every day | ORAL | Status: DC

## 2015-03-07 MED ORDER — TETANUS-DIPHTH-ACELL PERTUSSIS 5-2.5-18.5 LF-MCG/0.5 IM SUSP: 0.5000 mL | Freq: Once | INTRAMUSCULAR | Status: DC

## 2015-03-07 NOTE — Progress Notes (Signed)
Patient ID: Sharon Byrd, female   DOB: 10/07/1950, 64 y.o.   MRN: [id number]   Location:  [facility]  Goals of Care: Advanced Directive information Does patient have an advance directive?: No, Would patient like information on creating an advanced directive?: Yes - Educational materials given (previous visit)   Chief Complaint  Patient presents with  . Medical Management of Chronic Issues    6 month follow-up, fasting for any labs due. Patient with ongoing bowel issues (loose stools)  . Immunizations    Flu vaccine today, rx for TDaP given. Will get rx for shingles at next follow-up     HPI: Patient is a 64 y.o. white female seen in the office today for med mgt of chronic diseases.  She agrees to flu shot today.  Wants to postpone all other preventive care due to some insurance concerns/changeover.  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  Is going to be a grandmother of twins.  Says she's been neglecting her vitamins and herself some.    Still having loose stools.  Still needing the PPI for her reflux.   Has not been taking her probiotic either.  Also had cscope 2014.  Had internal hemorrhoids with rectal bleeding at that time.  Advised to go back to ranitidine temporarily to see if her loose stools stop. Says her reflux is bothered by sweets at night.  Has gained 10 lbs.      Got simvastatin new packets.  Lost them in the moving so has not been taking simvastatin.  Review of Systems:  Review of Systems  Constitutional: Negative for malaise/fatigue.       10# wt gain  Eyes: Negative for blurred vision.       Glasses  Respiratory: Negative for shortness of breath.   Cardiovascular: Negative for chest pain.  Gastrointestinal: Positive for abdominal pain and diarrhea. Negative for constipation, blood in stool and melena.       Had bleeding hemorrhoids in 2014 seen on cscope  Genitourinary: Negative for dysuria.    Musculoskeletal: Negative for myalgias and falls.  Skin: Negative for rash.  Neurological: Negative for dizziness, weakness and headaches.  Psychiatric/Behavioral: Negative for memory loss.       Increase stress trying to retire and close business    Past Medical History  Diagnosis Date  . Obesity, unspecified   . Chronic kidney disease, stage III (moderate)   . Disorder of bone and cartilage, unspecified   . Other malaise and fatigue   . Cramp of limb   . Enlargement of lymph nodes   . Unspecified vitamin D deficiency   . Diaphragmatic hernia without mention of obstruction or gangrene   . Congenital pigmentary anomaly of skin   . Other abnormal glucose   . Personal history of colonic polyps   . Osteoarthrosis, unspecified whether generalized or localized, unspecified site   . Other and unspecified hyperlipidemia   . Unspecified essential hypertension   . Reflux esophagitis   . Unspecified constipation   . Rosacea   . Pain in joint, site unspecified   . Lumbago   . Chest pain, unspecified     LexiScan Myoview (11/14): No ischemia, normal study, EF 83%  . Palpitation   . Finger fracture, right 10/13/2014    Middle finger on right hand     Past Surgical History  Procedure Laterality Date  . Back surgery  1986    Allergies  Allergen  Reactions  . Metrogel [Metronidazole]    Medications: Patient's Medications  New Prescriptions   No medications on file  Previous Medications   OMEPRAZOLE (PRILOSEC) 40 MG CAPSULE    Take 1 capsule (40 mg total) by mouth daily. Before breakfast   SIMVASTATIN (ZOCOR) 40 MG TABLET    TAKE 1 TABLET EVERY DAY FOR CHOLESTEROL.  Modified Medications   Modified Medication Previous Medication   TDAP (BOOSTRIX) 5-2.5-18.5 LF-MCG/0.5 INJECTION Tdap (BOOSTRIX) 5-2.5-18.5 LF-MCG/0.5 injection      Inject 0.5 mLs into the muscle once.    Inject 0.5 mLs into the muscle once.  Discontinued Medications   AZELAIC ACID (FINACEA EX)    Apply topically.  Use every other day for Rosacea   CALCIUM CARBONATE-VITAMIN D (CALCIUM-VITAMIN D) 500-200 MG-UNIT PER TABLET    Take 1 tablet by mouth 2 (two) times daily with a meal.   CARBAMIDE PEROXIDE (DEBROX) 6.5 % OTIC SOLUTION    Place 5 drops into the right ear 2 (two) times daily.   VITAMIN D, ERGOCALCIFEROL, (DRISDOL) 50000 UNITS CAPS CAPSULE    Take 1 capsule (50,000 Units total) by mouth every 7 (seven) days.   Physical Exam: Filed Vitals:   03/07/15 1128  BP: 128/80  Pulse: 80  Temp: 98.4 F (36.9 C)  TempSrc: Oral  Height:  (1.651 m)  Weight: 174 lb (78.926 kg)  SpO2: 97%   Physical Exam  Constitutional: She is oriented to person, place, and time. She appears well-developed and well-nourished. No distress.  Cardiovascular: Normal rate, regular rhythm, normal heart sounds and intact distal pulses.   Pulmonary/Chest: Effort normal and breath sounds normal. No respiratory distress.  Abdominal: Soft. Bowel sounds are normal. She exhibits no distension and no mass. There is no tenderness. There is no rebound and no guarding.  Musculoskeletal: Normal range of motion.  Ambulates without assistive device  Neurological: She is alert and oriented to person, place, and time.  Skin: Skin is warm and dry.  Psychiatric: She has a normal mood and affect.    Labs reviewed: Basic Metabolic Panel:  Recent Labs  46/96/29 0841  NA 139  K 4.4  CL 100  CO2 26  GLUCOSE 97  BUN 14  CREATININE 1.16*  CALCIUM 9.5  TSH 5.280*   Liver Function Tests:  Recent Labs  04/19/14 0841  AST 23  ALT 22  ALKPHOS 71  BILITOT 1.1  PROT 6.6   No results for input(s): LIPASE, AMYLASE in the last 8760 hours. No results for input(s): AMMONIA in the last 8760 hours. CBC:  Recent Labs  04/19/14 0841  WBC 5.7  NEUTROABS 3.6  HGB 13.9  HCT 40.4  MCV 85  PLT 251   Lipid Panel:  Recent Labs  04/19/14 0841  CHOL 167  HDL 59  LDLCALC 86  TRIG 108  CHOLHDL 2.8   Lab Results  Component  Value Date   HGBA1C 5.9* 04/19/2014   Assessment/Plan 1. Frequent loose stools - still think these are due to PPI  -advised to d/c omeprazole and restart zantac and see if stools improve -if not, I recommend she return to GI - Comprehensive metabolic panel - TSH  2. Gastroesophageal reflux disease without esophagitis - as above -also cut back on sweets at night that seem to worse symptoms - CBC with Differential/Platelet - Comprehensive metabolic panel - ranitidine (ZANTAC) 150 MG tablet; Take 1 tablet (150 mg total) by mouth daily before breakfast.  Dispense: 30 tablet; Refill: 3  3.  Prediabetes - will f/u lab, but suspect it will be worse b/c she has not been eating well or exercising and gained 10 lbs - Hemoglobin A1c  4. Chronic kidney disease (CKD) stage G3a/A2, moderately decreased glomerular filtration rate (GFR) between 45-59 mL/min/1.73 square meter and albuminuria creatinine ratio between 30-299 mg/g - reassess renal function, avoid nsaids - Comprehensive metabolic panel  5. Need for influenza vaccination - Flu Vaccine QUAD 36+ mos PF IM (Fluarix & Fluzone Quad PF) was given and Rx for tdap booster given to get [facility]  6. Hyperlipidemia LDL goal <160 - find and restart statin - Lipid panel  7. Osteopenia -resume exercise, has not been taking her vitamins either--needs to restart ca with D and additional D   Labs/tests ordered: Orders Placed This Encounter  Procedures  . Flu Vaccine QUAD 36+ mos PF IM (Fluarix & Fluzone Quad PF)  . CBC with Differential/Platelet  . Comprehensive metabolic panel    Order Specific Question:  Has the patient fasted?    Answer:  Yes  . Hemoglobin A1c  . Lipid panel    Order Specific Question:  Has the patient fasted?    Answer:  Yes  . TSH    Next appt:  3 mos for med mgt   Albirta Rhinehart L. Matthe Sloane, D.O. Geriatrics [facility] [address], [place]  [address] Cell Phone (Mon-Fri 8am-5pm):  [phone] On Call:  [phone] & follow prompts after 5pm & weekends Office Phone:  [phone] Office Fax:  [phone]

## 2015-03-08 LAB — CBC WITH DIFFERENTIAL/PLATELET
Basophils Absolute: 0 10*3/uL (ref 0.0–0.2)
Basos: 0 %
EOS (ABSOLUTE): 0.2 10*3/uL (ref 0.0–0.4)
Eos: 3 %
Hematocrit: 41.3 % (ref 34.0–46.6)
Hemoglobin: 13.5 g/dL (ref 11.1–15.9)
Immature Grans (Abs): 0 10*3/uL (ref 0.0–0.1)
Immature Granulocytes: 0 %
Lymphocytes Absolute: 1.6 10*3/uL (ref 0.7–3.1)
Lymphs: 34 %
MCH: 29.2 pg (ref 26.6–33.0)
MCHC: 32.7 g/dL (ref 31.5–35.7)
MCV: 89 fL (ref 79–97)
Monocytes Absolute: 0.4 10*3/uL (ref 0.1–0.9)
Monocytes: 9 %
Neutrophils Absolute: 2.6 10*3/uL (ref 1.4–7.0)
Neutrophils: 54 %
Platelets: 307 10*3/uL (ref 150–379)
RBC: 4.62 x10E6/uL (ref 3.77–5.28)
RDW: 13.7 % (ref 12.3–15.4)
WBC: 4.9 10*3/uL (ref 3.4–10.8)

## 2015-03-08 LAB — LIPID PANEL
Chol/HDL Ratio: 3.9 ratio units (ref 0.0–4.4)
Cholesterol, Total: 247 mg/dL — ABNORMAL HIGH (ref 100–199)
HDL: 63 mg/dL (ref >39)
LDL Calculated: 155 mg/dL — ABNORMAL HIGH (ref 0–99)
Triglycerides: 143 mg/dL (ref 0–149)
VLDL Cholesterol Cal: 29 mg/dL (ref 5–40)

## 2015-03-08 LAB — HEMOGLOBIN A1C
Est. average glucose Bld gHb Est-mCnc: 123 mg/dL
Hgb A1c MFr Bld: 5.9 % — ABNORMAL HIGH (ref 4.8–5.6)

## 2015-03-08 LAB — COMPREHENSIVE METABOLIC PANEL
ALT: 25 IU/L (ref 0–32)
AST: 22 IU/L (ref 0–40)
Albumin/Globulin Ratio: 2.1 (ref 1.1–2.5)
Albumin: 4.6 g/dL (ref 3.6–4.8)
Alkaline Phosphatase: 76 IU/L (ref 39–117)
BUN/Creatinine Ratio: 13 (ref 11–26)
BUN: 13 mg/dL (ref 8–27)
Bilirubin Total: 0.8 mg/dL (ref 0.0–1.2)
CO2: 25 mmol/L (ref 18–29)
Calcium: 9.7 mg/dL (ref 8.7–10.3)
Chloride: 102 mmol/L (ref 97–108)
Creatinine, Ser: 0.98 mg/dL (ref 0.57–1.00)
GFR calc Af Amer: 70 mL/min/{1.73_m2} (ref >59)
GFR calc non Af Amer: 61 mL/min/{1.73_m2} (ref >59)
Globulin, Total: 2.2 g/dL (ref 1.5–4.5)
Glucose: 99 mg/dL (ref 65–99)
Potassium: 4.3 mmol/L (ref 3.5–5.2)
Sodium: 142 mmol/L (ref 134–144)
Total Protein: 6.8 g/dL (ref 6.0–8.5)

## 2015-03-08 LAB — TSH: TSH: 2.64 u[IU]/mL (ref 0.450–4.500)

## 2015-03-19 DIAGNOSIS — L718 Other rosacea: Secondary | ICD-10-CM | POA: Diagnosis not present

## 2015-03-19 DIAGNOSIS — L578 Other skin changes due to chronic exposure to nonionizing radiation: Secondary | ICD-10-CM | POA: Diagnosis not present

## 2015-03-19 DIAGNOSIS — L821 Other seborrheic keratosis: Secondary | ICD-10-CM | POA: Diagnosis not present

## 2015-03-20 DIAGNOSIS — H04123 Dry eye syndrome of bilateral lacrimal glands: Secondary | ICD-10-CM | POA: Diagnosis not present

## 2015-05-01 DIAGNOSIS — S92535A Nondisplaced fracture of distal phalanx of left lesser toe(s), initial encounter for closed fracture: Secondary | ICD-10-CM | POA: Diagnosis not present

## 2015-05-01 DIAGNOSIS — S0083XA Contusion of other part of head, initial encounter: Secondary | ICD-10-CM | POA: Diagnosis not present

## 2015-05-01 DIAGNOSIS — S52501A Unspecified fracture of the lower end of right radius, initial encounter for closed fracture: Secondary | ICD-10-CM | POA: Diagnosis not present

## 2015-05-02 DIAGNOSIS — S52509A Unspecified fracture of the lower end of unspecified radius, initial encounter for closed fracture: Secondary | ICD-10-CM | POA: Diagnosis not present

## 2015-05-02 DIAGNOSIS — Y92099 Unspecified place in other non-institutional residence as the place of occurrence of the external cause: Secondary | ICD-10-CM | POA: Diagnosis not present

## 2015-05-05 DIAGNOSIS — H1032 Unspecified acute conjunctivitis, left eye: Secondary | ICD-10-CM | POA: Diagnosis not present

## 2015-05-05 DIAGNOSIS — J069 Acute upper respiratory infection, unspecified: Secondary | ICD-10-CM | POA: Diagnosis not present

## 2015-05-14 DIAGNOSIS — J209 Acute bronchitis, unspecified: Secondary | ICD-10-CM | POA: Diagnosis not present

## 2015-05-15 ENCOUNTER — Other Ambulatory Visit: Payer: Self-pay | Admitting: *Deleted

## 2015-05-15 DIAGNOSIS — K219 Gastro-esophageal reflux disease without esophagitis: Secondary | ICD-10-CM

## 2015-05-15 MED ORDER — RANITIDINE HCL 150 MG PO TABS: 150.0000 mg | ORAL_TABLET | Freq: Every day | ORAL | Status: DC

## 2015-05-15 NOTE — Telephone Encounter (Signed)
[facility] 

## 2015-05-30 DIAGNOSIS — S52501D Unspecified fracture of the lower end of right radius, subsequent encounter for closed fracture with routine healing: Secondary | ICD-10-CM | POA: Diagnosis not present

## 2015-06-03 DIAGNOSIS — Z9181 History of falling: Secondary | ICD-10-CM | POA: Diagnosis not present

## 2015-06-03 DIAGNOSIS — M25631 Stiffness of right wrist, not elsewhere classified: Secondary | ICD-10-CM | POA: Diagnosis not present

## 2015-06-03 DIAGNOSIS — M25531 Pain in right wrist: Secondary | ICD-10-CM | POA: Diagnosis not present

## 2015-06-03 DIAGNOSIS — S6291XS Unspecified fracture of right wrist and hand, sequela: Secondary | ICD-10-CM | POA: Diagnosis not present

## 2015-06-06 DIAGNOSIS — M25531 Pain in right wrist: Secondary | ICD-10-CM | POA: Diagnosis not present

## 2015-06-06 DIAGNOSIS — S6291XS Unspecified fracture of right wrist and hand, sequela: Secondary | ICD-10-CM | POA: Diagnosis not present

## 2015-06-06 DIAGNOSIS — M25631 Stiffness of right wrist, not elsewhere classified: Secondary | ICD-10-CM | POA: Diagnosis not present

## 2015-06-06 DIAGNOSIS — Z9181 History of falling: Secondary | ICD-10-CM | POA: Diagnosis not present

## 2015-06-11 DIAGNOSIS — M25631 Stiffness of right wrist, not elsewhere classified: Secondary | ICD-10-CM | POA: Diagnosis not present

## 2015-06-11 DIAGNOSIS — S6291XS Unspecified fracture of right wrist and hand, sequela: Secondary | ICD-10-CM | POA: Diagnosis not present

## 2015-06-11 DIAGNOSIS — Z9181 History of falling: Secondary | ICD-10-CM | POA: Diagnosis not present

## 2015-06-11 DIAGNOSIS — M25531 Pain in right wrist: Secondary | ICD-10-CM | POA: Diagnosis not present

## 2015-06-12 DIAGNOSIS — Z9181 History of falling: Secondary | ICD-10-CM | POA: Diagnosis not present

## 2015-06-12 DIAGNOSIS — M25531 Pain in right wrist: Secondary | ICD-10-CM | POA: Diagnosis not present

## 2015-06-12 DIAGNOSIS — M25631 Stiffness of right wrist, not elsewhere classified: Secondary | ICD-10-CM | POA: Diagnosis not present

## 2015-06-12 DIAGNOSIS — S6291XS Unspecified fracture of right wrist and hand, sequela: Secondary | ICD-10-CM | POA: Diagnosis not present

## 2015-06-18 DIAGNOSIS — S6291XS Unspecified fracture of right wrist and hand, sequela: Secondary | ICD-10-CM | POA: Diagnosis not present

## 2015-06-18 DIAGNOSIS — M25631 Stiffness of right wrist, not elsewhere classified: Secondary | ICD-10-CM | POA: Diagnosis not present

## 2015-06-18 DIAGNOSIS — M25531 Pain in right wrist: Secondary | ICD-10-CM | POA: Diagnosis not present

## 2015-06-18 DIAGNOSIS — Z9181 History of falling: Secondary | ICD-10-CM | POA: Diagnosis not present

## 2015-06-23 DIAGNOSIS — S6291XS Unspecified fracture of right wrist and hand, sequela: Secondary | ICD-10-CM | POA: Diagnosis not present

## 2015-06-23 DIAGNOSIS — M25631 Stiffness of right wrist, not elsewhere classified: Secondary | ICD-10-CM | POA: Diagnosis not present

## 2015-06-23 DIAGNOSIS — M25531 Pain in right wrist: Secondary | ICD-10-CM | POA: Diagnosis not present

## 2015-06-23 DIAGNOSIS — Z9181 History of falling: Secondary | ICD-10-CM | POA: Diagnosis not present

## 2015-06-27 ENCOUNTER — Telehealth: Payer: Self-pay | Admitting: *Deleted

## 2015-06-27 NOTE — Telephone Encounter (Signed)
Patient called and wanted to clarify what she was taking for her stomach and last OV note from Dr. Renato Gailseed changed her to Ranitidine.

## 2015-07-08 DIAGNOSIS — S6291XS Unspecified fracture of right wrist and hand, sequela: Secondary | ICD-10-CM | POA: Diagnosis not present

## 2015-07-08 DIAGNOSIS — M25631 Stiffness of right wrist, not elsewhere classified: Secondary | ICD-10-CM | POA: Diagnosis not present

## 2015-07-08 DIAGNOSIS — M25531 Pain in right wrist: Secondary | ICD-10-CM | POA: Diagnosis not present

## 2015-07-08 DIAGNOSIS — Z9181 History of falling: Secondary | ICD-10-CM | POA: Diagnosis not present

## 2015-08-01 ENCOUNTER — Emergency Department (INDEPENDENT_AMBULATORY_CARE_PROVIDER_SITE_OTHER)
Admission: EM | Admit: 2015-08-01 | Discharge: 2015-08-01 | Disposition: A | Discharge status: Home / Self Care | Payer: Medicare Other | Source: Home / Self Care | Attending: Family Medicine | Admitting: Family Medicine

## 2015-08-01 ENCOUNTER — Ambulatory Visit (INDEPENDENT_AMBULATORY_CARE_PROVIDER_SITE_OTHER): Payer: Medicare Other | Admitting: Internal Medicine

## 2015-08-01 ENCOUNTER — Encounter: Payer: Medicare Other | Admitting: Internal Medicine

## 2015-08-01 ENCOUNTER — Encounter (HOSPITAL_COMMUNITY): Payer: Self-pay | Admitting: Emergency Medicine

## 2015-08-01 ENCOUNTER — Emergency Department (INDEPENDENT_AMBULATORY_CARE_PROVIDER_SITE_OTHER): Payer: Medicare Other

## 2015-08-01 ENCOUNTER — Encounter: Payer: Self-pay | Admitting: Internal Medicine

## 2015-08-01 VITALS — BP 140/82 | HR 90 | Temp 99.5°F | Ht 64.5 in | Wt 175.0 lb

## 2015-08-01 DIAGNOSIS — K219 Gastro-esophageal reflux disease without esophagitis: Secondary | ICD-10-CM

## 2015-08-01 DIAGNOSIS — N183 Chronic kidney disease, stage 3 (moderate): Secondary | ICD-10-CM

## 2015-08-01 DIAGNOSIS — J111 Influenza due to unidentified influenza virus with other respiratory manifestations: Secondary | ICD-10-CM | POA: Diagnosis not present

## 2015-08-01 DIAGNOSIS — I10 Essential (primary) hypertension: Secondary | ICD-10-CM | POA: Diagnosis not present

## 2015-08-01 DIAGNOSIS — R7303 Prediabetes: Secondary | ICD-10-CM | POA: Diagnosis not present

## 2015-08-01 DIAGNOSIS — E785 Hyperlipidemia, unspecified: Secondary | ICD-10-CM

## 2015-08-01 DIAGNOSIS — R509 Fever, unspecified: Secondary | ICD-10-CM | POA: Diagnosis not present

## 2015-08-01 DIAGNOSIS — Z0000 Encounter for general adult medical examination without abnormal findings: Secondary | ICD-10-CM

## 2015-08-01 DIAGNOSIS — N1831 Chronic kidney disease, stage 3a: Secondary | ICD-10-CM

## 2015-08-01 DIAGNOSIS — R69 Illness, unspecified: Principal | ICD-10-CM

## 2015-08-01 DIAGNOSIS — R05 Cough: Secondary | ICD-10-CM | POA: Diagnosis not present

## 2015-08-01 MED ORDER — OMEPRAZOLE 40 MG PO CPDR: 40.0000 mg | DELAYED_RELEASE_CAPSULE | Freq: Every day | ORAL | Status: AC

## 2015-08-01 MED ORDER — GUAIFENESIN-CODEINE 100-10 MG/5ML PO SYRP: 10.0000 mL | ORAL_SOLUTION | Freq: Four times a day (QID) | ORAL | Status: DC | PRN

## 2015-08-01 MED ORDER — SIMVASTATIN 40 MG PO TABS: ORAL_TABLET | ORAL | Status: AC

## 2015-08-01 NOTE — Discharge Instructions (Signed)
Drink plenty of fluids as discussed, use medicine as prescribed, and mucinex or delsym for cough. Return or see your doctor if further problems °

## 2015-08-01 NOTE — ED Notes (Signed)
Pt stated she is trying to find a DR. That will test for the flu and may leave. Not ready to come to exam room. To advise on her descision

## 2015-08-01 NOTE — Patient Instructions (Signed)
Please got to urgent care for an influenza swab and chest xray.  I will be on the lookout for your results.

## 2015-08-01 NOTE — ED Provider Notes (Signed)
CSN: [id number]     Arrival date & time 08/01/15  1303 History   First MD Initiated Contact with Patient 08/01/15 1332     Chief Complaint  Patient presents with  . URI   (Consider location/radiation/quality/duration/timing/severity/associated sxs/prior Treatment) Patient is a 65 y.o. female presenting with URI. The history is provided by the patient.  URI Presenting symptoms: congestion, cough, fever and rhinorrhea   Severity:  Moderate Onset quality:  Gradual Duration:  2 days Progression:  Worsening Chronicity:  New Worsened by:  Nothing tried Ineffective treatments:  None tried Associated symptoms: myalgias   Risk factors: chronic kidney disease     Past Medical History  Diagnosis Date  . Obesity, unspecified   . Chronic kidney disease, stage III (moderate)   . Disorder of bone and cartilage, unspecified   . Other malaise and fatigue   . Cramp of limb   . Enlargement of lymph nodes   . Unspecified vitamin D deficiency   . Diaphragmatic hernia without mention of obstruction or gangrene   . Congenital pigmentary anomaly of skin   . Other abnormal glucose   . Personal history of colonic polyps   . Osteoarthrosis, unspecified whether generalized or localized, unspecified site   . Other and unspecified hyperlipidemia   . Unspecified essential hypertension   . Reflux esophagitis   . Unspecified constipation   . Rosacea   . Pain in joint, site unspecified   . Lumbago   . Chest pain, unspecified     LexiScan Myoview (11/14): No ischemia, normal study, EF 83%  . Palpitation   . Finger fracture, right 10/13/2014    Middle finger on right hand    Past Surgical History  Procedure Laterality Date  . Back surgery  1986  . Wrist surgery     History reviewed. No pertinent family history. Social History  Substance Use Topics  . Smoking status: Former Smoker -- 12 years    Types: Cigarettes    Quit date: 06/14/1981  . Smokeless tobacco: Never Used  . Alcohol Use: No    OB History    No data available     Review of Systems  Constitutional: Positive for fever and chills.  HENT: Positive for congestion, postnasal drip and rhinorrhea.   Respiratory: Positive for cough. Negative for shortness of breath.   Cardiovascular: Negative.   Gastrointestinal: Negative.   Musculoskeletal: Positive for myalgias.  All other systems reviewed and are negative.   Allergies  Metrogel  Home Medications   Prior to Admission medications   Medication Sig Start Date End Date Taking? Authorizing Provider  guaiFENesin (MUCINEX) 600 MG 12 hr tablet Take by mouth 2 (two) times daily.   Yes Historical Provider, MD  Cholecalciferol (VITAMIN D PO) Take by mouth. Once a day or once a week patient not sure    Historical Provider, MD  guaiFENesin-codeine (ROBITUSSIN AC) 100-10 MG/5ML syrup Take 10 mLs by mouth 4 (four) times daily as needed for cough. 08/01/15   Linna Hoff, MD  omeprazole (PRILOSEC) 40 MG capsule Take 1 capsule (40 mg total) by mouth daily. 08/01/15   Tiffany L Reed, DO  simvastatin (ZOCOR) 40 MG tablet TAKE 1 TABLET EVERY DAY FOR CHOLESTEROL. 08/01/15   Tiffany L Reed, DO   Meds Ordered and Administered this Visit  Medications - No data to display  BP 136/86 mmHg  Pulse 117  Temp(Src) 98.9 F (37.2 C) (Oral)  Resp 16  SpO2 96% No data found.   Physical  Exam  Constitutional: She is oriented to person, place, and time. She appears well-developed and well-nourished. No distress.  HENT:  Right Ear: External ear normal.  Left Ear: External ear normal.  Mouth/Throat: Oropharynx is clear and moist.  Eyes: Pupils are equal, round, and reactive to light.  Neck: Normal range of motion. Neck supple.  Cardiovascular: Normal rate, regular rhythm, normal heart sounds and intact distal pulses.   Pulmonary/Chest: Effort normal. She has rales.  Lymphadenopathy:    She has no cervical adenopathy.  Neurological: She is alert and oriented to person, place, and  time.  Skin: Skin is warm and dry.  Nursing note and vitals reviewed.   ED Course  Procedures (including critical care time)  Labs Review Labs Reviewed - No data to display  Imaging Review Dg Chest 2 View  08/01/2015  CLINICAL DATA:  Cough, fever. EXAM: CHEST  2 VIEW COMPARISON:  September 02, 2008. FINDINGS: The heart size and mediastinal contours are within normal limits. Stable calcified granuloma are noted in right middle lobe. No acute pulmonary disease is noted. No pneumothorax or pleural effusion is noted. The visualized skeletal structures are unremarkable. IMPRESSION: No active cardiopulmonary disease. Electronically Signed   By: Lupita Raider, M.D.   On: 08/01/2015 13:58   X-rays reviewed and report per radiologist.   Visual Acuity Review  Right Eye Distance:   Left Eye Distance:   Bilateral Distance:    Right Eye Near:   Left Eye Near:    Bilateral Near:         MDM   1. Influenza-like illness    Meds ordered this encounter  Medications  . guaiFENesin (MUCINEX) 600 MG 12 hr tablet    Sig: Take by mouth 2 (two) times daily.  Marland Kitchen guaiFENesin-codeine (ROBITUSSIN AC) 100-10 MG/5ML syrup    Sig: Take 10 mLs by mouth 4 (four) times daily as needed for cough.    Dispense:  180 mL    Refill:  0       Linna Hoff, MD 08/01/15 1410

## 2015-08-01 NOTE — Progress Notes (Signed)
Patient ID: Sharon Byrd, female   DOB: 1950-10-13, 65 y.o.   MRN: [id number]   Location: [facility]  Provider: Gwenith Spitz. Renato Gails, D.O., C.M.D.  Code Status: full code Goals of Care: Advanced Directive information Does patient have an advance directive?: Yes, Type of Advance Directive: Healthcare Power of Attorney  Chief Complaint  Patient presents with  . Annual Exam    Wellness exam   . Medical Management of Chronic Issues    blood pressure, cholesterol, CKD  . Cough    for 2 days, hurts to cough, coughing up yellow phlegm, ache, nose running.   Marland Kitchen MMSE    28/30 passed clock drawing    HPI: Patient is a 65 y.o. female seen in the office today for an annual wellness exam  and acute visit for productive cough of yellow sputum, rhinorrhea, aches.  Says 3-4 days ago, had scratchy throat.  Painful when she coughs.  Daughter on bedrest.  Got mucinex sinus max and taking that.  She was just taking care of twin 72 month old babies yesterday.  Her cough and congestion got worse and worse since yesterday.  Her grandson got the flu on the day the camey home from the hospital 2 wks ago.    Last night very achy also.  Last night was terrible with painful coughing.  Whole family did take tamiflu two weeks ago as prophylaxis after her grandson had flu.  Lost her last two pills (only took 8 days worth).  Depression screen [facility] 2/9 08/01/2015 04/18/2014 02/21/2013 11/07/2012  Decreased Interest 0 0 0 0  Down, Depressed, Hopeless 0 0 0 0  PHQ - 2 Score 0 0 0 0    Fall Risk  08/01/2015 04/18/2014 02/21/2013 11/07/2012  Falls in the past year? Yes Yes No No  Number falls in past yr: 2 or more 1 - -  Injury with Fall? Yes Yes - -   MMSE - Mini Mental State Exam 08/01/2015  Orientation to time 5  Orientation to Place 5  Registration 3  Attention/ Calculation 5  Recall 1  Language- name 2 objects 2  Language- repeat 1  Language- follow 3 step command 3  Language- read & follow direction 1   Write a sentence 1  Copy design 1  Total score 28  passed clock   Health Maintenance  Topic Date Due  . Hepatitis C Screening  February 12, 1951  . HIV Screening  10/30/1964  . TETANUS/TDAP  10/30/1968  . PAP SMEAR  10/31/1970  . ZOSTAVAX  10/30/2009  . PNA vac Low Risk Adult (1 of 2 - PCV13) 10/31/2014  . INFLUENZA VACCINE  01/13/2016  . MAMMOGRAM  07/26/2016  . COLONOSCOPY  05/09/2023  . DEXA SCAN  Completed   Urinary incontinence?  No difficulty Functional status?  Independent in daily functioning Exercise?  Is active, but not doing dedicated exercise while looking after her daughter and grandbabies. Diet?  Not currently in past 4 mos has not been following diet.  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 Pain:  Gets a pain under her axilla and breast area when she turns to get something behind her.  Feels like a cramp.  When turns back around it resolves.  Review of Systems:  Review of Systems  Constitutional: Positive for fever, chills and malaise/fatigue.  HENT: Positive for congestion.  Negative for ear pain and sore throat.   Eyes: Negative for discharge and redness.       Glasses  Respiratory: Positive for cough, sputum production and shortness of breath. Negative for hemoptysis and wheezing.   Cardiovascular: Positive for chest pain.  Gastrointestinal: Negative for nausea, vomiting and diarrhea.  Genitourinary: Negative for dysuria.  Musculoskeletal: Positive for myalgias. Negative for falls.       Cramps in left axillary area and around to back when reaching right arm behind her  Skin: Negative for itching and rash.  Neurological: Negative for dizziness and headaches.  Psychiatric/Behavioral: Negative for depression and memory loss.    Past Medical History  Diagnosis Date  . Obesity, unspecified   . Chronic  kidney disease, stage III (moderate)   . Disorder of bone and cartilage, unspecified   . Other malaise and fatigue   . Cramp of limb   . Enlargement of lymph nodes   . Unspecified vitamin D deficiency   . Diaphragmatic hernia without mention of obstruction or gangrene   . Congenital pigmentary anomaly of skin   . Other abnormal glucose   . Personal history of colonic polyps   . Osteoarthrosis, unspecified whether generalized or localized, unspecified site   . Other and unspecified hyperlipidemia   . Unspecified essential hypertension   . Reflux esophagitis   . Unspecified constipation   . Rosacea   . Pain in joint, site unspecified   . Lumbago   . Chest pain, unspecified     LexiScan Myoview (11/14): No ischemia, normal study, EF 83%  . Palpitation   . Finger fracture, right 10/13/2014    Middle finger on right hand     Past Surgical History  Procedure Laterality Date  . Back surgery  1986    The patient has a family history of  shoulder  Allergies  Allergen Reactions  . Metrogel [Metronidazole]       Medication List       This list is accurate as of: 08/01/15  9:25 AM.  Always use your most recent med list.               omeprazole 40 MG capsule  Commonly known as:  PRILOSEC  Take 40 mg by mouth daily. Take one tablet daily for stomach     simvastatin 40 MG tablet  Commonly known as:  ZOCOR  TAKE 1 TABLET EVERY DAY FOR CHOLESTEROL.     Tdap 5-2.5-18.5 LF-MCG/0.5 injection  Commonly known as:  BOOSTRIX  Inject 0.5 mLs into the muscle once.     VITAMIN D PO  Take by mouth. Once a day or once a week patient not sure        Physical Exam: Filed Vitals:   08/01/15 0901  BP: 140/82  Pulse: 90  Temp: 99.5 F (37.5 C)  TempSrc: Oral  Height: 5' 4.5" (1.638 m)  Weight: 175 lb (79.379 kg)  SpO2: 99%   Body mass index is 29.59 kg/(m^2). Physical Exam  Constitutional: She is oriented to person, place, and time. She appears well-developed and  well-nourished. She appears distressed.  HENT:  Head: Normocephalic and atraumatic.  Right Ear: External ear normal.  Left Ear: External ear normal.  Nose: Nose normal.  Mouth/Throat: Oropharynx is clear and moist. No oropharyngeal exudate.  Eyes: Conjunctivae and EOM are normal. Pupils are equal, round, and reactive to light.  Neck: Normal range of motion. Neck supple.  Pulmonary/Chest: Effort normal. No respiratory distress. She has no  wheezes. She has no rales. She exhibits no tenderness. Right breast exhibits no inverted nipple, no mass, no nipple discharge, no skin change and no tenderness. Left breast exhibits no inverted nipple, no mass, no nipple discharge, no skin change and no tenderness.  Coarse wet rhonchi throughout all lung fields  Abdominal: Soft. Bowel sounds are normal. She exhibits no distension and no mass. There is no tenderness.  Genitourinary:  Will need pap/pelvic at routine visit  Musculoskeletal: Normal range of motion.  Tried to perform pap/pelvic, but pt unable to position feet in stirrups w/o severe cramping--three attempts made and she could not tolerate the position  Lymphadenopathy:    She has no cervical adenopathy.       Right cervical: No superficial cervical, no deep cervical and no posterior cervical adenopathy present.      Left cervical: No superficial cervical, no deep cervical and no posterior cervical adenopathy present.    She has no axillary adenopathy.       Right: No supraclavicular adenopathy present.  Neurological: She is alert and oriented to person, place, and time. She has normal reflexes. No cranial nerve deficit.  Skin: Skin is warm and dry.  Warm skin, flushed  Psychiatric: She has a normal mood and affect. Her behavior is normal. Judgment and thought content normal.    Labs reviewed: Basic Metabolic Panel:  Recent Labs  16/10/96 1204  NA 142  K 4.3  CL 102  CO2 25  GLUCOSE 99  BUN 13  CREATININE 0.98  CALCIUM 9.7  TSH  2.640   Liver Function Tests:  Recent Labs  03/07/15 1204  AST 22  ALT 25  ALKPHOS 76  BILITOT 0.8  PROT 6.8  ALBUMIN 4.6   No results for input(s): LIPASE, AMYLASE in the last 8760 hours. No results for input(s): AMMONIA in the last 8760 hours. CBC:  Recent Labs  03/07/15 1204  WBC 4.9  NEUTROABS 2.6  HCT 41.3  MCV 89  PLT 307   Lipid Panel:  Recent Labs  03/07/15 1204  CHOL 247*  HDL 63  LDLCALC 155*  TRIG 143  CHOLHDL 3.9   Lab Results  Component Value Date   HGBA1C 5.9* 03/07/2015    Procedures: EKG:  NSR at 93, no changes from prior EKG; no acute ischemia or infarct  Assessment/Plan 1. Medicare annual wellness visit, initial -see hpi -needs pneumonia vaccines also when she is healthy -was meant to get shingles Rx today to go get at pharmacy in the future, but did not happen in midst of dealing with possible flu -pap/pelvic next visit  2. Influenza-like illness -advised to go to urgent care for flu swab to determine for sure if she has the flu b/c of looking after her grandchildren -already took prophylaxis 2 wks ago, but didn't finish course -seems like typical presentation, unfortunately -would have just treated her, but I was concerned about the rest of her family also--knowing for sure would be helpful  3. Essential hypertension, benign -bp at upper limits of normal today -cont to monitor--likely due to using dm cough medication - EKG 12-Lead -if remains elevated, will put on meds next time  4. Gastroesophageal reflux disease without esophagitis -cont omeprazole which has been helpful for her--Rx printed  5. Hyperlipidemia -last lipids not at goal in sept and has been eating poorly since while watching grandbabies and helping her daughter  -given instructions in sept with lab results, but pt's contact info was incorrect so she did not  receive this--given a copy today - simvastatin (ZOCOR) 40 MG tablet; TAKE 1 TABLET EVERY DAY FOR  CHOLESTEROL.  Dispense: 90 tablet; Refill: 1--Rx printed  6. Prediabetes -must make dietary changes and increase physical activity to help lower this value--discussed briefly today -will need hba1c next visit  7. Chronic kidney disease (CKD) stage G3a/A2, moderately decreased glomerular filtration rate (GFR) between 45-59 mL/min/1.73 square meter and albuminuria creatinine ratio between 30-299 mg/g -last labs 9/16, will need f/u next visit  Labs/tests ordered:  No new Next appt:  3 mos med mgt  Meldrick Buttery L. Shyquan Stallbaumer, D.O. Geriatrics [facility] [address], [place] [address] Cell Phone (Mon-Fri 8am-5pm):  [phone] On Call:  [phone] & follow prompts after 5pm & weekends Office Phone:  [phone] Office Fax:  [phone]

## 2015-08-01 NOTE — ED Notes (Signed)
Nose running, cough, coughing up phlegm, reports as yellow.  Patient reports temp 100.  Patient has been to dr reids office today and sent to [facility] for xray and flu testing.  Patient aware we do not do flu testing.  Symptoms started Wednesday evening.

## 2015-08-01 NOTE — Progress Notes (Signed)
Patient ID: Sharon Byrd, female   DOB: Jan 12, 1951, 65 y.o.   MRN: [id number] MMSE 28/30 passed clock drawing

## 2015-08-11 ENCOUNTER — Telehealth: Payer: Self-pay | Admitting: *Deleted

## 2015-08-11 NOTE — Telephone Encounter (Signed)
[facility] [phone] sent fax order for Bone Density due to low bone mass. Given to Dr. Renato Gails to review and sign. To be faxed to [facility] F#: [phone]

## 2015-08-14 DIAGNOSIS — M810 Age-related osteoporosis without current pathological fracture: Secondary | ICD-10-CM | POA: Diagnosis not present

## 2015-08-14 DIAGNOSIS — M8589 Other specified disorders of bone density and structure, multiple sites: Secondary | ICD-10-CM | POA: Diagnosis not present

## 2015-08-14 DIAGNOSIS — Z1231 Encounter for screening mammogram for malignant neoplasm of breast: Secondary | ICD-10-CM | POA: Diagnosis not present

## 2015-08-14 LAB — HM DEXA SCAN

## 2015-08-14 LAB — HM MAMMOGRAPHY

## 2015-08-18 ENCOUNTER — Encounter: Payer: Self-pay | Admitting: *Deleted

## 2015-08-19 ENCOUNTER — Telehealth: Payer: Self-pay

## 2015-08-19 NOTE — Telephone Encounter (Signed)
Called to inform patient of recent bone density scan results. Patient stated that she understood the results and instructions to continue taking calcium and vitamin D, plus to add weight bearing exercises.   Patient stated that she would like to know if she can have a refill on Vitamin D 50,000 units, that is taken once weekly.

## 2015-08-19 NOTE — Telephone Encounter (Signed)
Ok to refill vitamin D [id number] po weekly--4 pills with 3 refills.

## 2015-08-20 ENCOUNTER — Other Ambulatory Visit: Payer: Self-pay

## 2015-08-20 MED ORDER — VITAMIN D (ERGOCALCIFEROL) 1.25 MG (50000 UNIT) PO CAPS: 50000.0000 [IU] | ORAL_CAPSULE | ORAL | Status: AC

## 2015-08-20 NOTE — Telephone Encounter (Signed)
Prescription sent to pharmacy. Vitamin D 50,000units. Take 1 capsule by mouth once weekly.

## 2015-09-17 DIAGNOSIS — Z23 Encounter for immunization: Secondary | ICD-10-CM | POA: Diagnosis not present

## 2015-09-17 DIAGNOSIS — R05 Cough: Secondary | ICD-10-CM | POA: Diagnosis not present

## 2015-10-16 ENCOUNTER — Encounter: Payer: Medicare Other | Admitting: Internal Medicine

## 2015-12-25 DIAGNOSIS — M170 Bilateral primary osteoarthritis of knee: Secondary | ICD-10-CM | POA: Diagnosis not present

## 2016-04-29 DIAGNOSIS — Z23 Encounter for immunization: Secondary | ICD-10-CM | POA: Diagnosis not present

## 2016-06-03 DIAGNOSIS — K219 Gastro-esophageal reflux disease without esophagitis: Secondary | ICD-10-CM | POA: Diagnosis not present

## 2016-06-03 DIAGNOSIS — E559 Vitamin D deficiency, unspecified: Secondary | ICD-10-CM | POA: Diagnosis not present

## 2016-06-03 DIAGNOSIS — Z13228 Encounter for screening for other metabolic disorders: Secondary | ICD-10-CM | POA: Diagnosis not present

## 2016-06-03 DIAGNOSIS — E785 Hyperlipidemia, unspecified: Secondary | ICD-10-CM | POA: Diagnosis not present

## 2016-06-03 DIAGNOSIS — L719 Rosacea, unspecified: Secondary | ICD-10-CM | POA: Diagnosis not present

## 2016-06-03 DIAGNOSIS — Z79899 Other long term (current) drug therapy: Secondary | ICD-10-CM | POA: Diagnosis not present

## 2016-06-03 DIAGNOSIS — J309 Allergic rhinitis, unspecified: Secondary | ICD-10-CM | POA: Insufficient documentation

## 2016-06-14 HISTORY — PX: OTHER SURGICAL HISTORY: SHX169

## 2016-06-29 ENCOUNTER — Encounter: Payer: Self-pay | Admitting: Nurse Practitioner

## 2016-06-29 ENCOUNTER — Ambulatory Visit (INDEPENDENT_AMBULATORY_CARE_PROVIDER_SITE_OTHER): Payer: Medicare Other | Admitting: Nurse Practitioner

## 2016-06-29 VITALS — BP 130/78 | HR 87 | Temp 98.3°F | Resp 19 | Ht 64.0 in | Wt 178.2 lb

## 2016-06-29 DIAGNOSIS — J019 Acute sinusitis, unspecified: Secondary | ICD-10-CM | POA: Diagnosis not present

## 2016-06-29 NOTE — Patient Instructions (Signed)
Chloroseptic throat spray.   Tylenol 325 mg (2 tablets) every 6 hours as needed for pain.  Warm drinks such as honey and lemon tea.   Nettipot twice a day.  Saline nasal spray throughout the day as needed for congestion.    Sinusitis, Adult Sinusitis is soreness and inflammation of your sinuses. Sinuses are hollow spaces in the bones around your face. They are located:  Around your eyes.  In the middle of your forehead.  Behind your nose.  In your cheekbones. Your sinuses and nasal passages are lined with a stringy fluid (mucus). Mucus normally drains out of your sinuses. When your nasal tissues get inflamed or swollen, the mucus can get trapped or blocked so air cannot flow through your sinuses. This lets bacteria, viruses, and funguses grow, and that leads to infection. Follow these instructions at home: Medicines  Take, use, or apply over-the-counter and prescription medicines only as told by your doctor. These may include nasal sprays.  If you were prescribed an antibiotic medicine, take it as told by your doctor. Do not stop taking the antibiotic even if you start to feel better. Hydrate and Humidify  Drink enough water to keep your pee (urine) clear or pale yellow.  Use a cool mist humidifier to keep the humidity level in your home above 50%.  Breathe in steam for 10-15 minutes, 3-4 times a day or as told by your doctor. You can do this in the bathroom while a hot shower is running.  Try not to spend time in cool or dry air. Rest  Rest as much as possible.  Sleep with your head raised (elevated).  Make sure to get enough sleep each night. General instructions  Put a warm, moist washcloth on your face 3-4 times a day or as told by your doctor. This will help with discomfort.  Wash your hands often with soap and water. If there is no soap and water, use hand sanitizer.  Do not smoke. Avoid being around people who are smoking (secondhand smoke).  Keep all  follow-up visits as told by your doctor. This is important. Contact a doctor if:  You have a fever.  Your symptoms get worse.  Your symptoms do not get better within 10 days. Get help right away if:  You have a very bad headache.  You cannot stop throwing up (vomiting).  You have pain or swelling around your face or eyes.  You have trouble seeing.  You feel confused.  Your neck is stiff.  You have trouble breathing. This information is not intended to replace advice given to you by your health care provider. Make sure you discuss any questions you have with your health care provider. Document Released: 11/17/2007 Document Revised: 01/25/2016 Document Reviewed: 03/26/2015 Elsevier Interactive Patient Education  2017 [employer]

## 2016-06-29 NOTE — Progress Notes (Signed)
Careteam: Patient Care Team: Sharon Balo, DO as PCP - General (Geriatric Medicine)  Advanced Directive information Does Patient Have a Medical Advance Directive?: Yes, Type of Advance Directive: Healthcare Power of Attorney  Allergies  Allergen Reactions  . Metrogel [Metronidazole]     Chief Complaint  Patient presents with  . Acute Visit    Slight cough, throat irritation, headache, nasal congestion x 4 days     HPI: Patient is a 66 y.o. female seen in the office today with complaints of sore throat, head congestion, and headache. Sore throat started 4 days ago and got worse yesterday.  Occasional productive cough with yellow sputum. Feels like her headache is due to her sore throat. Treatments include gargling with salt water, Advil, and Zicam which have not been effective. Numerous sick contacts.  Feels like she got this from her granddaughters.   Review of Systems:  Review of Systems  Constitutional: Negative for chills and fever.  HENT: Positive for congestion (head), ear pain, postnasal drip, sinus pain, sinus pressure and sore throat. Negative for rhinorrhea and sneezing.   Respiratory: Positive for cough. Negative for shortness of breath and wheezing.   Cardiovascular: Negative for chest pain and palpitations.  Gastrointestinal: Negative for abdominal pain, diarrhea, nausea and vomiting.  Musculoskeletal: Negative for myalgias.  Neurological: Positive for headaches. Negative for dizziness and light-headedness.    Past Medical History:  Diagnosis Date  . Chest pain, unspecified    LexiScan Myoview (11/14): No ischemia, normal study, EF 83%  . Chronic kidney disease, stage III (moderate)   . Congenital pigmentary anomaly of skin   . Cramp of limb   . Diaphragmatic hernia without mention of obstruction or gangrene   . Disorder of bone and cartilage, unspecified   . Enlargement of lymph nodes   . Finger fracture, right 10/13/2014   Middle finger on right hand    . Lumbago   . Obesity, unspecified   . Osteoarthrosis, unspecified whether generalized or localized, unspecified site   . Other abnormal glucose   . Other and unspecified hyperlipidemia   . Other malaise and fatigue   . Pain in joint, site unspecified   . Palpitation   . Personal history of colonic polyps   . Reflux esophagitis   . Rosacea   . Unspecified constipation   . Unspecified essential hypertension   . Unspecified vitamin D deficiency    Past Surgical History:  Procedure Laterality Date  . BACK SURGERY  1986  . WRIST SURGERY     Social History:   reports that she quit smoking about 35 years ago. Her smoking use included Cigarettes. She quit after 12.00 years of use. She has never used smokeless tobacco. She reports that she does not drink alcohol or use drugs.  History reviewed. No pertinent family history.  Medications: Patient's Medications  New Prescriptions   No medications on file  Previous Medications   OMEPRAZOLE (PRILOSEC) 40 MG CAPSULE    Take 1 capsule (40 mg total) by mouth daily.   SIMVASTATIN (ZOCOR) 40 MG TABLET    TAKE 1 TABLET EVERY DAY FOR CHOLESTEROL.   VITAMIN D, ERGOCALCIFEROL, (DRISDOL) 50000 UNITS CAPS CAPSULE    Take 1 capsule (50,000 Units total) by mouth once a week.  Modified Medications   No medications on file  Discontinued Medications   CHOLECALCIFEROL (VITAMIN D PO)    Take by mouth. Once a day or once a week patient not sure   GUAIFENESIN (MUCINEX) 600  MG 12 HR TABLET    Take by mouth 2 (two) times daily.   GUAIFENESIN-CODEINE (ROBITUSSIN AC) 100-10 MG/5ML SYRUP    Take 10 mLs by mouth 4 (four) times daily as needed for cough.     Physical Exam:  Vitals:   06/29/16 1601  BP: 130/78  Pulse: 87  Resp: 19  Temp: 98.3 F (36.8 C)  TempSrc: Oral  SpO2: 98%  Weight: 178 lb 3.2 oz (80.8 kg)  Height: 5\' 4"  (1.626 m)   Body mass index is 30.59 kg/m.  Physical Exam  Constitutional: She is oriented to person, place, and time.  She appears well-developed and well-nourished.  HENT:  Head: Normocephalic.  Right Ear: External ear normal.  Left Ear: External ear normal.  Mouth/Throat: Posterior oropharyngeal erythema present.  Eyes: Conjunctivae are normal. Pupils are equal, round, and reactive to light.  Neck: Normal range of motion. Neck supple.  Cardiovascular: Normal rate, regular rhythm and normal heart sounds.   Pulmonary/Chest: Effort normal and breath sounds normal. No respiratory distress. She has no wheezes. She has no rales. She exhibits no tenderness.  Musculoskeletal: Normal range of motion.  Neurological: She is alert and oriented to person, place, and time.  Skin: Skin is warm and dry.  Psychiatric: She has a normal mood and affect. Her behavior is normal. Judgment and thought content normal.    Labs reviewed: Basic Metabolic Panel: No results for input(s): NA, K, CL, CO2, GLUCOSE, BUN, CREATININE, CALCIUM, MG, PHOS, TSH in the last 8760 hours. Liver Function Tests: No results for input(s): AST, ALT, ALKPHOS, BILITOT, PROT, ALBUMIN in the last 8760 hours. No results for input(s): LIPASE, AMYLASE in the last 8760 hours. No results for input(s): AMMONIA in the last 8760 hours. CBC: No results for input(s): WBC, NEUTROABS, HGB, HCT, MCV, PLT in the last 8760 hours. Lipid Panel: No results for input(s): CHOL, HDL, LDLCALC, TRIG, CHOLHDL, LDLDIRECT in the last 8760 hours. TSH: No results for input(s): TSH in the last 8760 hours. A1C: Lab Results  Component Value Date   HGBA1C 5.9 (H) 03/07/2015     Assessment/Plan 1. Acute non-recurrent sinusitis, unspecified location - Educated that the sore throat is likely the beginning and she may experience increased mucus production and cough, but that it is self-limiting.  - Chloraseptic spray to sooth the throat - Mucinex DM 1 tablet twice a day with a full glass of water if the cough worsens - Saline nasal spray throughout the day to help with nasal  congestion - Nettipot twice a day - Increase fluids and good nutrition - Tylenol 325 mg (2 tablets) Q6H PRN for pain - Warm drinks such as honey and lemon tea - Education provided on Sinusitis    - Notify/seek medical attention if symptoms do not resolve or improve within 7-10 days.  Sharon HarveyJessica K. Biagio Byrd, AGNP  [facility] [phone](Monday-Friday 8 am - 5 pm) [phone] (after hours)

## 2016-08-27 ENCOUNTER — Telehealth: Payer: Self-pay | Admitting: Internal Medicine

## 2016-08-27 NOTE — Telephone Encounter (Signed)
left message asking pt to call and schedule AWV that's due now (subsequent). VDM (DD)

## 2016-08-27 NOTE — Telephone Encounter (Signed)
Pt called me back and stated that she is in [place] during the week and hasn't been able to schedule an appointment.  She says she will be there on weekdays for about a year but will try to schedule an appointment whenever she is in [place]. VDM (DD)

## 2016-09-10 DIAGNOSIS — H25813 Combined forms of age-related cataract, bilateral: Secondary | ICD-10-CM | POA: Diagnosis not present

## 2016-09-14 DIAGNOSIS — R1012 Left upper quadrant pain: Secondary | ICD-10-CM | POA: Diagnosis not present

## 2016-09-14 DIAGNOSIS — E559 Vitamin D deficiency, unspecified: Secondary | ICD-10-CM | POA: Diagnosis not present

## 2016-09-14 DIAGNOSIS — R7303 Prediabetes: Secondary | ICD-10-CM | POA: Diagnosis not present

## 2016-09-14 DIAGNOSIS — E039 Hypothyroidism, unspecified: Secondary | ICD-10-CM | POA: Diagnosis not present

## 2016-09-14 DIAGNOSIS — E785 Hyperlipidemia, unspecified: Secondary | ICD-10-CM | POA: Diagnosis not present

## 2016-11-26 DIAGNOSIS — Z1231 Encounter for screening mammogram for malignant neoplasm of breast: Secondary | ICD-10-CM | POA: Diagnosis not present

## 2016-11-26 LAB — HM MAMMOGRAPHY

## 2016-11-29 ENCOUNTER — Encounter: Payer: Self-pay | Admitting: *Deleted

## 2016-12-03 DIAGNOSIS — E039 Hypothyroidism, unspecified: Secondary | ICD-10-CM | POA: Diagnosis not present

## 2016-12-03 DIAGNOSIS — Z0000 Encounter for general adult medical examination without abnormal findings: Secondary | ICD-10-CM | POA: Diagnosis not present

## 2016-12-03 DIAGNOSIS — Z719 Counseling, unspecified: Secondary | ICD-10-CM | POA: Diagnosis not present

## 2016-12-03 DIAGNOSIS — Z1389 Encounter for screening for other disorder: Secondary | ICD-10-CM | POA: Diagnosis not present

## 2017-03-11 ENCOUNTER — Other Ambulatory Visit: Payer: Self-pay | Admitting: Internal Medicine

## 2017-03-11 DIAGNOSIS — R7303 Prediabetes: Secondary | ICD-10-CM

## 2017-04-05 ENCOUNTER — Other Ambulatory Visit (INDEPENDENT_AMBULATORY_CARE_PROVIDER_SITE_OTHER): Payer: Self-pay | Admitting: Family Medicine

## 2017-04-05 DIAGNOSIS — Z79899 Other long term (current) drug therapy: Secondary | ICD-10-CM | POA: Diagnosis not present

## 2017-04-05 DIAGNOSIS — R05 Cough: Secondary | ICD-10-CM | POA: Diagnosis not present

## 2017-04-05 DIAGNOSIS — R7303 Prediabetes: Secondary | ICD-10-CM | POA: Diagnosis not present

## 2017-04-05 DIAGNOSIS — K219 Gastro-esophageal reflux disease without esophagitis: Secondary | ICD-10-CM | POA: Diagnosis not present

## 2017-04-05 DIAGNOSIS — E039 Hypothyroidism, unspecified: Secondary | ICD-10-CM | POA: Diagnosis not present

## 2017-04-05 DIAGNOSIS — R011 Cardiac murmur, unspecified: Secondary | ICD-10-CM | POA: Diagnosis not present

## 2017-04-05 DIAGNOSIS — E785 Hyperlipidemia, unspecified: Secondary | ICD-10-CM | POA: Diagnosis not present

## 2017-04-05 DIAGNOSIS — E079 Disorder of thyroid, unspecified: Secondary | ICD-10-CM | POA: Insufficient documentation

## 2017-04-15 DIAGNOSIS — R079 Chest pain, unspecified: Secondary | ICD-10-CM | POA: Diagnosis not present

## 2017-04-15 DIAGNOSIS — Z7189 Other specified counseling: Secondary | ICD-10-CM | POA: Diagnosis not present

## 2017-04-15 DIAGNOSIS — E785 Hyperlipidemia, unspecified: Secondary | ICD-10-CM | POA: Diagnosis not present

## 2017-04-15 DIAGNOSIS — R011 Cardiac murmur, unspecified: Secondary | ICD-10-CM | POA: Diagnosis not present

## 2017-04-24 DIAGNOSIS — Z23 Encounter for immunization: Secondary | ICD-10-CM | POA: Diagnosis not present

## 2017-05-10 DIAGNOSIS — R070 Pain in throat: Secondary | ICD-10-CM | POA: Diagnosis not present

## 2017-05-10 DIAGNOSIS — R05 Cough: Secondary | ICD-10-CM | POA: Diagnosis not present

## 2017-05-10 DIAGNOSIS — J020 Streptococcal pharyngitis: Secondary | ICD-10-CM | POA: Diagnosis not present

## 2017-05-10 DIAGNOSIS — R030 Elevated blood-pressure reading, without diagnosis of hypertension: Secondary | ICD-10-CM | POA: Diagnosis not present

## 2017-05-20 DIAGNOSIS — R011 Cardiac murmur, unspecified: Secondary | ICD-10-CM | POA: Diagnosis not present

## 2017-05-25 DIAGNOSIS — E039 Hypothyroidism, unspecified: Secondary | ICD-10-CM | POA: Diagnosis not present

## 2017-05-25 DIAGNOSIS — I10 Essential (primary) hypertension: Secondary | ICD-10-CM | POA: Diagnosis not present

## 2017-07-19 ENCOUNTER — Telehealth: Payer: Self-pay | Admitting: Internal Medicine

## 2017-07-19 NOTE — Telephone Encounter (Signed)
I left a message asking the patient to call us to schedule an appointment. Pt hasn't been seen since Jan 2018. VDM (DD)

## 2017-08-02 ENCOUNTER — Other Ambulatory Visit (INDEPENDENT_AMBULATORY_CARE_PROVIDER_SITE_OTHER): Payer: Self-pay | Admitting: Family Medicine

## 2017-08-02 DIAGNOSIS — I10 Essential (primary) hypertension: Secondary | ICD-10-CM | POA: Insufficient documentation

## 2017-08-24 ENCOUNTER — Telehealth: Payer: Self-pay | Admitting: Internal Medicine

## 2017-08-24 NOTE — Telephone Encounter (Signed)
I left a message asking the pt to call and schedule appt if available because she hasn't been seen in a while. VDM (DD)

## 2017-09-27 ENCOUNTER — Telehealth: Payer: Self-pay | Admitting: Internal Medicine

## 2017-09-27 NOTE — Telephone Encounter (Signed)
I left a message asking the pt to call me at [phone] to schedule appt. VDM (DD)

## 2017-09-30 ENCOUNTER — Other Ambulatory Visit (INDEPENDENT_AMBULATORY_CARE_PROVIDER_SITE_OTHER): Payer: Self-pay | Admitting: Family Medicine

## 2017-12-12 HISTORY — PX: OTHER SURGICAL HISTORY: SHX169

## 2018-03-09 ENCOUNTER — Other Ambulatory Visit (INDEPENDENT_AMBULATORY_CARE_PROVIDER_SITE_OTHER): Payer: Self-pay | Admitting: Family Medicine

## 2018-03-09 DIAGNOSIS — Z79899 Other long term (current) drug therapy: Secondary | ICD-10-CM | POA: Insufficient documentation

## 2018-03-09 DIAGNOSIS — Z86010 Personal history of colonic polyps: Secondary | ICD-10-CM | POA: Insufficient documentation

## 2018-04-19 ENCOUNTER — Other Ambulatory Visit (INDEPENDENT_AMBULATORY_CARE_PROVIDER_SITE_OTHER): Payer: Self-pay | Admitting: Family Medicine

## 2018-04-26 ENCOUNTER — Other Ambulatory Visit (INDEPENDENT_AMBULATORY_CARE_PROVIDER_SITE_OTHER): Payer: Self-pay | Admitting: Family Medicine

## 2018-07-06 HISTORY — PX: COLONOSCOPY, DIAGNOSTIC (SCREENING): SHX174

## 2018-11-29 LAB — COMPREHENSIVE METABOLIC PANEL
ALT: 30 IU/L (ref 0–32)
ALT: 34 IU/L — ABNORMAL HIGH (ref 0–32)
AST (SGOT): 27 IU/L (ref 0–40)
AST (SGOT): 27 IU/L (ref 0–40)
Albumin/Globulin Ratio: 1.5 (ref 1.2–2.2)
Albumin/Globulin Ratio: 1.5 (ref 1.2–2.2)
Albumin: 4.4 g/dL (ref 3.6–4.8)
Albumin: 4.3 g/dL (ref 3.6–4.8)
Alkaline Phosphatase: 86 IU/L (ref 39–117)
Alkaline Phosphatase: 88 IU/L (ref 39–117)
BUN / Creatinine Ratio: 13 (ref 12–28)
BUN / Creatinine Ratio: 23 (ref 12–28)
BUN: 13 mg/dL (ref 8–27)
BUN: 26 mg/dL (ref 8–27)
Bilirubin, Total: 0.9 mg/dL (ref 0.0–1.2)
Bilirubin, Total: 0.6 mg/dL (ref 0.0–1.2)
CO2: 25 mmol/L (ref 20–29)
CO2: 23 mmol/L (ref 20–29)
Calcium: 10.3 mg/dL (ref 8.7–10.3)
Calcium: 10.3 mg/dL (ref 8.7–10.3)
Chloride: 102 mmol/L (ref 96–106)
Chloride: 103 mmol/L (ref 96–106)
Creatinine: 1.02 mg/dL — ABNORMAL HIGH (ref 0.57–1.00)
Creatinine: 1.13 mg/dL — ABNORMAL HIGH (ref 0.57–1.00)
EGFR: 57 mL/min/{1.73_m2} — ABNORMAL LOW (ref >59)
EGFR: 66 mL/min/{1.73_m2} (ref >59)
EGFR: 50 mL/min/{1.73_m2} — ABNORMAL LOW (ref >59)
EGFR: 58 mL/min/{1.73_m2} — ABNORMAL LOW (ref >59)
Globulin, Total: 2.9 g/dL (ref 1.5–4.5)
Globulin, Total: 2.8 g/dL (ref 1.5–4.5)
Glucose: 100 mg/dL — ABNORMAL HIGH (ref 65–99)
Glucose: 107 mg/dL — ABNORMAL HIGH (ref 65–99)
Potassium: 5.2 mmol/L (ref 3.5–5.2)
Potassium: 5.2 mmol/L (ref 3.5–5.2)
Protein, Total: 7.3 g/dL (ref 6.0–8.5)
Protein, Total: 7.1 g/dL (ref 6.0–8.5)
Sodium: 140 mmol/L (ref 134–144)
Sodium: 140 mmol/L (ref 134–144)

## 2018-11-29 LAB — LIPID PANEL WITH LDL/HDL RATIO
Cholesterol: 208 mg/dL — ABNORMAL HIGH (ref 100–199)
Cholesterol: 209 mg/dL — ABNORMAL HIGH (ref 100–199)
HDL: 57 mg/dL (ref >39)
HDL: 57 mg/dL (ref >39)
LDL Calculated: 119 mg/dL — ABNORMAL HIGH (ref 0–99)
LDL Calculated: 123 mg/dL — ABNORMAL HIGH (ref 0–99)
LDl/HDL Ratio: 2.1 ratio (ref 0.0–3.2)
LDl/HDL Ratio: 2.2 ratio (ref 0.0–3.2)
Triglycerides: 158 mg/dL — ABNORMAL HIGH (ref 0–149)
Triglycerides: 145 mg/dL (ref 0–149)
VLDL Calculated: 32 mg/dL (ref 5–40)
VLDL Calculated: 29 mg/dL (ref 5–40)

## 2018-11-29 LAB — CBC AND DIFFERENTIAL
Baso(Absolute): 0 10*3/uL (ref 0.0–0.2)
Basos: 0 %
Eos: 3 %
Eosinophils Absolute: 0.3 10*3/uL (ref 0.0–0.4)
Hematocrit: 40.8 % (ref 34.0–46.6)
Hemoglobin: 13.1 g/dL (ref 11.1–15.9)
Immature Granulocytes Absolute: 0 10*3/uL (ref 0.0–0.1)
Immature Granulocytes: 0 %
Lymphocytes Absolute: 2 10*3/uL (ref 0.7–3.1)
Lymphocytes: 24 %
MCH: 28.1 pg (ref 26.6–33.0)
MCHC: 32.1 g/dL (ref 31.5–35.7)
MCV: 88 fL (ref 79–97)
Monocytes Absolute: 0.6 10*3/uL (ref 0.1–0.9)
Monocytes: 7 %
Neutrophils Absolute: 5.6 10*3/uL (ref 1.4–7.0)
Neutrophils: 66 %
Platelets: 329 10*3/uL (ref 150–379)
RBC: 4.66 x10E6/uL (ref 3.77–5.28)
RDW: 15 % (ref 12.3–15.4)
WBC: 8.5 10*3/uL (ref 3.4–10.8)

## 2018-11-29 LAB — THYROID SCREEN
T4, Free: 1.72 ng/dL (ref 0.82–1.77)
T4, Free: 1.52 ng/dL (ref 0.82–1.77)
T4, Free: 1.27 ng/dL (ref 0.82–1.77)
TSH: 1.55 u[IU]/mL (ref 0.450–4.500)
TSH: 1.54 u[IU]/mL (ref 0.450–4.500)
TSH: 3.78 u[IU]/mL (ref 0.450–4.500)

## 2018-11-29 LAB — HEMOGLOBIN A1C: Hemoglobin A1C: 5.8 % — ABNORMAL HIGH (ref 4.8–5.6)

## 2018-11-29 LAB — VITAMIN D,25 OH,TOTAL: Vitamin D 25-Hydroxy: 38.3 ng/mL (ref 30.0–100.0)

## 2019-01-12 ENCOUNTER — Encounter (INDEPENDENT_AMBULATORY_CARE_PROVIDER_SITE_OTHER): Payer: Self-pay

## 2019-01-17 LAB — CBC AND DIFFERENTIAL
Baso(Absolute): 0 10*3/uL (ref 0.0–0.2)
Basos: 1 %
Eos: 3 %
Eosinophils Absolute: 0.2 10*3/uL (ref 0.0–0.4)
Hematocrit: 39.5 % (ref 34.0–46.6)
Hemoglobin: 13.1 g/dL (ref 11.1–15.9)
Immature Granulocytes Absolute: 0 10*3/uL (ref 0.0–0.1)
Immature Granulocytes: 0 %
Lymphocytes Absolute: 1.8 10*3/uL (ref 0.7–3.1)
Lymphocytes: 33 %
MCH: 28.9 pg (ref 26.6–33.0)
MCHC: 33.2 g/dL (ref 31.5–35.7)
MCV: 87 fL (ref 79–97)
Monocytes Absolute: 0.5 10*3/uL (ref 0.1–0.9)
Monocytes: 8 %
Neutrophils Absolute: 3.1 10*3/uL (ref 1.4–7.0)
Neutrophils: 55 %
Platelets: 275 10*3/uL (ref 150–450)
RBC: 4.53 x10E6/uL (ref 3.77–5.28)
RDW: 13 % (ref 12.3–15.4)
WBC: 5.6 10*3/uL (ref 3.4–10.8)

## 2019-01-17 LAB — COMPREHENSIVE METABOLIC PANEL
ALT: 26 IU/L (ref 0–32)
AST (SGOT): 23 IU/L (ref 0–40)
Albumin/Globulin Ratio: 1.8 (ref 1.2–2.2)
Albumin: 4.4 g/dL (ref 3.6–4.8)
Alkaline Phosphatase: 81 IU/L (ref 39–117)
BUN / Creatinine Ratio: 18 (ref 12–28)
BUN: 22 mg/dL (ref 8–27)
Bilirubin, Total: 0.9 mg/dL (ref 0.0–1.2)
CO2: 25 mmol/L (ref 20–29)
Calcium: 9.9 mg/dL (ref 8.7–10.3)
Chloride: 103 mmol/L (ref 96–106)
Creatinine: 1.19 mg/dL — ABNORMAL HIGH (ref 0.57–1.00)
EGFR: 47 mL/min/{1.73_m2} — ABNORMAL LOW (ref >59)
EGFR: 54 mL/min/{1.73_m2} — ABNORMAL LOW (ref >59)
Globulin, Total: 2.4 g/dL (ref 1.5–4.5)
Glucose: 99 mg/dL (ref 65–99)
Potassium: 4.9 mmol/L (ref 3.5–5.2)
Protein, Total: 6.8 g/dL (ref 6.0–8.5)
Sodium: 142 mmol/L (ref 134–144)

## 2019-01-17 LAB — BASIC METABOLIC PANEL
BUN / Creatinine Ratio: 15 (ref 12–28)
BUN: 17 mg/dL (ref 8–27)
CO2: 23 mmol/L (ref 20–29)
Calcium: 9.7 mg/dL (ref 8.7–10.3)
Chloride: 103 mmol/L (ref 96–106)
Creatinine: 1.1 mg/dL — ABNORMAL HIGH (ref 0.57–1.00)
EGFR: 52 mL/min/{1.73_m2} — ABNORMAL LOW (ref >59)
EGFR: 60 mL/min/{1.73_m2} (ref >59)
Glucose: 100 mg/dL — ABNORMAL HIGH (ref 65–99)
Potassium: 4.8 mmol/L (ref 3.5–5.2)
Sodium: 141 mmol/L (ref 134–144)

## 2019-01-17 LAB — LIPID PANEL
Cholesterol / HDL Ratio: 3.5 ratio (ref 0.0–4.4)
Cholesterol: 198 mg/dL (ref 100–199)
HDL: 56 mg/dL (ref >39)
LDL Calculated: 110 mg/dL — ABNORMAL HIGH (ref 0–99)
Triglycerides: 159 mg/dL — ABNORMAL HIGH (ref 0–149)
VLDL Calculated: 32 mg/dL (ref 5–40)

## 2019-01-17 LAB — TSH: TSH: 1.27 u[IU]/mL (ref 0.450–4.500)

## 2019-01-17 LAB — MAGNESIUM: Magnesium: 2.3 mg/dL (ref 1.6–2.3)

## 2019-02-21 ENCOUNTER — Encounter (INDEPENDENT_AMBULATORY_CARE_PROVIDER_SITE_OTHER): Payer: Self-pay | Admitting: Family Medicine

## 2019-02-21 ENCOUNTER — Telehealth (INDEPENDENT_AMBULATORY_CARE_PROVIDER_SITE_OTHER): Payer: Medicare Other | Admitting: Family Medicine

## 2019-02-21 VITALS — Wt 187.0 lb

## 2019-02-21 DIAGNOSIS — R5383 Other fatigue: Secondary | ICD-10-CM | POA: Insufficient documentation

## 2019-02-21 DIAGNOSIS — E559 Vitamin D deficiency, unspecified: Secondary | ICD-10-CM

## 2019-02-21 DIAGNOSIS — E782 Mixed hyperlipidemia: Secondary | ICD-10-CM

## 2019-02-21 DIAGNOSIS — E039 Hypothyroidism, unspecified: Secondary | ICD-10-CM

## 2019-02-21 DIAGNOSIS — F411 Generalized anxiety disorder: Secondary | ICD-10-CM | POA: Insufficient documentation

## 2019-02-21 DIAGNOSIS — I10 Essential (primary) hypertension: Secondary | ICD-10-CM

## 2019-02-21 MED ORDER — METOPROLOL TARTRATE 50 MG PO TABS
50.0000 mg | ORAL_TABLET | Freq: Every day | ORAL | 0 refills | Status: DC
Start: 2019-02-21 — End: 2019-06-02

## 2019-02-21 NOTE — Progress Notes (Signed)
[facility] - [place]                       Date of Virtual Visit: 02/21/2019 3:58 PM        Patient ID: Christine Bridges is a 68 y.o. female.  Attending Physician: Satira Mccallum, MD       Telemedicine Eligibility:    State Location:  [x]  [place]  []  [place]  []  [place] []  [place]  []  Other:    Physical Location:  [x]  Home  []         []        []          []  Other:    Patient Identity Verification:  [x]  State Issued ID  []  Insurance Eligibility Check  []  Other:    Physical Address Verification: (for 911)  [x]  Yes  []  No    Personal identity shared with patient:  [x]  Yes  []  No    Education on nature of video visit shared with patient:  [x]  Yes  []  No    Emergency plan agreed upon with patient:  [x]  Yes  []  No    If the patient had not had this virtual visit, what would they have done?  []         []         []        []          []  Other:    Visit terminated since not appropriate for virtual care:  [x]  N/A  []  Reason:         Chief Complaint:    Chief Complaint   Patient presents with    Fatigue    Hypertension               HPI:    68 Y/O F presents today for a virtual visit for medication f/u and refills        1. Hyperlipidemia  Stated she has been taking medications are prescribed  She Stated trying to eat healthy   Denies side effects           2. Gastroesophageal Reflux Diesease  Stated taking medications as prescribed  Stated that the dosage is working  Stated that she feels they are working properly      3.Hypothyroidism  Stated taking medication as prescribed      Pt states she has been feeling down   PHq9 and GAD 7 updated    Verbal consent given to bill for this VV     Feel worn out  Afraid of getting covid          Problem List:    Patient Active Problem List   Diagnosis    GAD (generalized anxiety disorder)    Vitamin D deficiency    Fatigue, unspecified type    Mixed hyperlipidemia    Acquired hypothyroidism    Essential hypertension              Current Meds:    Outpatient Medications Marked as Taking for the 02/21/19 encounter (Telemedicine Visit) with Satira Mccallum, MD   Medication Sig Dispense Refill    cetirizine (ZyrTEC Allergy) 10 MG tablet every 24 hours      levothyroxine (SYNTHROID) 75 MCG tablet Take 1 tablet by mouth daily      metoprolol tartrate (LOPRESSOR) 50 MG tablet Take 1 tablet (50 mg total) by mouth daily 90 tablet 0    Multiple  Vitamins-Minerals (MULTIVITAMIN ADULTS 50+ PO) multivitamin      omeprazole (PriLOSEC) 40 MG capsule omeprazole 40 mg capsule,delayed release   TAKE 1 CAPSULE BY MOUTH ONCE DAILY      simvastatin (ZOCOR) 40 MG tablet Take 1 tablet by mouth daily      [DISCONTINUED] metoprolol tartrate (LOPRESSOR) 50 MG tablet Take 1 tablet by mouth daily            Allergies:    Allergies   Allergen Reactions    Metronidazole              Past Surgical History:    History reviewed. No pertinent surgical history.        Family History:    History reviewed. No pertinent family history.        Social History:    Social History     Socioeconomic History    Marital status: Unknown     Spouse name: Not on file    Number of children: Not on file    Years of education: Not on file    Highest education level: Not on file   Occupational History    Not on file   Social Needs    Financial resource strain: Not on file    Food insecurity     Worry: Not on file     Inability: Not on file    Transportation needs     Medical: Not on file     Non-medical: Not on file   Tobacco Use    Smoking status: Former Smoker     Packs/day: 1.00    Smokeless tobacco: Never Used   Substance and Sexual Activity    Alcohol use: Not on file    Drug use: Not on file    Sexual activity: Not on file   Lifestyle    Physical activity     Days per week: Not on file     Minutes per session: Not on file    Stress: Not on file   Relationships    Social connections     Talks on phone: Not on file     Gets together: Not on file     Attends religious  service: Not on file     Active member of club or organization: Not on file     Attends meetings of clubs or organizations: Not on file     Relationship status: Not on file    Intimate partner violence     Fear of current or ex partner: Not on file     Emotionally abused: Not on file     Physically abused: Not on file     Forced sexual activity: Not on file   Other Topics Concern    Not on file   Social History Narrative    Not on file                  Vital Signs:    Wt 84.8 kg (187 lb)          ROS:    Review of Systems   Get ha which feels better after taking metoprolol  Wt gain  No sob, no cp        Physical Exam:    Physical Exam   GENERAL APPEARANCE: alert, in no acute distress, pleasant, well nourished.   HEAD: normal appearance  EYES: no discharge  EARS: normal hearing  NECK/THYROID: appearance -supple  PSYCH: appropriate affect, appropriate mood, normal  speech, normal attention        Assessment:    1. Essential hypertension  - metoprolol tartrate (LOPRESSOR) 50 MG tablet; Take 1 tablet (50 mg total) by mouth daily  Dispense: 90 tablet; Refill: 0    2. Acquired hypothyroidism  - TSH; Future    3. Mixed hyperlipidemia  - Lipid panel; Future    4. Vitamin D deficiency  - Vitamin D,25 [place], Total; Future    5. Fatigue, unspecified type  - CBC and differential; Future  - Comprehensive metabolic panel; Future    6. GAD (generalized anxiety disorder)            Plan:    Continue current medicines  Side effect of meds discussed  Do healthy , high fiber, low cholesterol, low carb diet  Exercise most days  Plan for wellness visit every year  Need follow up after labs  Check bp daily and in am when not feeling well and let me know in next week            Follow-up:    Return in about 1 week (around 02/28/2019) for chronic care follow up.         Satira Mccallum, MD

## 2019-02-27 ENCOUNTER — Telehealth (INDEPENDENT_AMBULATORY_CARE_PROVIDER_SITE_OTHER): Payer: Self-pay | Admitting: Family Medicine

## 2019-02-27 ENCOUNTER — Other Ambulatory Visit (INDEPENDENT_AMBULATORY_CARE_PROVIDER_SITE_OTHER): Payer: Self-pay | Admitting: Family Medicine

## 2019-02-27 DIAGNOSIS — K219 Gastro-esophageal reflux disease without esophagitis: Secondary | ICD-10-CM

## 2019-02-27 DIAGNOSIS — F411 Generalized anxiety disorder: Secondary | ICD-10-CM

## 2019-02-27 DIAGNOSIS — I10 Essential (primary) hypertension: Secondary | ICD-10-CM

## 2019-02-27 DIAGNOSIS — E039 Hypothyroidism, unspecified: Secondary | ICD-10-CM

## 2019-02-27 DIAGNOSIS — E782 Mixed hyperlipidemia: Secondary | ICD-10-CM

## 2019-02-27 MED ORDER — OMEPRAZOLE 40 MG PO CPDR
40.00 mg | DELAYED_RELEASE_CAPSULE | Freq: Every day | ORAL | 0 refills | Status: DC
Start: 2019-02-27 — End: 2019-04-10

## 2019-02-27 MED ORDER — SIMVASTATIN 40 MG PO TABS
40.0000 mg | ORAL_TABLET | Freq: Every day | ORAL | 0 refills | Status: DC
Start: 2019-02-27 — End: 2019-03-23

## 2019-02-27 MED ORDER — LEVOTHYROXINE SODIUM 75 MCG PO TABS
75.0000 ug | ORAL_TABLET | Freq: Every day | ORAL | 0 refills | Status: DC
Start: 2019-02-27 — End: 2019-03-23

## 2019-02-27 NOTE — Telephone Encounter (Signed)
Please advise. Pt had VV with Dr. Jonathon Bellows 02/21/19

## 2019-02-27 NOTE — Telephone Encounter (Signed)
Last Office Visit (Less than 6 months): 02/21/19    Prescriber: Maia Petties    Medication:   Name: Levothyroxine 75mg    Name: Omeprazole 40mg    Name: Simvastatin 40mg     How many days left of meds: Levothyroxine is completely out and Omeprazole and simvastatin she has 5 10 left.    Pharmacy: [facility]  [address], [place], [place] [address]  Ph: [phone]    Comments:  Pt needs refill on Levothyroxine, she is completely out and needs a refill.

## 2019-03-12 ENCOUNTER — Ambulatory Visit (INDEPENDENT_AMBULATORY_CARE_PROVIDER_SITE_OTHER): Payer: Medicare Other

## 2019-03-12 DIAGNOSIS — E559 Vitamin D deficiency, unspecified: Secondary | ICD-10-CM

## 2019-03-12 DIAGNOSIS — E782 Mixed hyperlipidemia: Secondary | ICD-10-CM

## 2019-03-12 DIAGNOSIS — E039 Hypothyroidism, unspecified: Secondary | ICD-10-CM

## 2019-03-12 DIAGNOSIS — R5383 Other fatigue: Secondary | ICD-10-CM

## 2019-03-12 NOTE — Progress Notes (Signed)
68 y/o F, presents for a BW lab visit per Dr. Jonathon Bellows

## 2019-03-13 LAB — COMPREHENSIVE METABOLIC PANEL
ALT: 38 IU/L — ABNORMAL HIGH (ref 0–32)
AST (SGOT): 31 IU/L (ref 0–40)
Albumin/Globulin Ratio: 1.7 (ref 1.2–2.2)
Albumin: 4.5 g/dL (ref 3.8–4.8)
Alkaline Phosphatase: 91 IU/L (ref 39–117)
BUN / Creatinine Ratio: 18 (ref 12–28)
BUN: 20 mg/dL (ref 8–27)
Bilirubin, Total: 0.8 mg/dL (ref 0.0–1.2)
CO2: 23 mmol/L (ref 20–29)
Calcium: 9.9 mg/dL (ref 8.7–10.3)
Chloride: 104 mmol/L (ref 96–106)
Creatinine: 1.1 mg/dL — ABNORMAL HIGH (ref 0.57–1.00)
EGFR: 51 mL/min/{1.73_m2} — ABNORMAL LOW (ref >59)
EGFR: 59 mL/min/{1.73_m2} — ABNORMAL LOW (ref >59)
Globulin, Total: 2.6 g/dL (ref 1.5–4.5)
Glucose: 111 mg/dL — ABNORMAL HIGH (ref 65–99)
Potassium: 4.5 mmol/L (ref 3.5–5.2)
Protein, Total: 7.1 g/dL (ref 6.0–8.5)
Sodium: 138 mmol/L (ref 134–144)

## 2019-03-13 LAB — TSH: TSH: 1.69 u[IU]/mL (ref 0.450–4.500)

## 2019-03-13 LAB — LIPID PANEL
Cholesterol / HDL Ratio: 3.6 ratio (ref 0.0–4.4)
Cholesterol: 211 mg/dL — ABNORMAL HIGH (ref 100–199)
HDL: 58 mg/dL (ref >39)
LDL Chol Calculated (NIH): 116 mg/dL — ABNORMAL HIGH (ref 0–99)
Triglycerides: 215 mg/dL — ABNORMAL HIGH (ref 0–149)
VLDL Calculated: 37 mg/dL (ref 5–40)

## 2019-03-13 LAB — CBC AND DIFFERENTIAL
Baso(Absolute): 0.1 10*3/uL (ref 0.0–0.2)
Basos: 1 %
Eos: 5 %
Eosinophils Absolute: 0.3 10*3/uL (ref 0.0–0.4)
Hematocrit: 40.6 % (ref 34.0–46.6)
Hemoglobin: 13.4 g/dL (ref 11.1–15.9)
Immature Granulocytes Absolute: 0 10*3/uL (ref 0.0–0.1)
Immature Granulocytes: 0 %
Lymphocytes Absolute: 2.1 10*3/uL (ref 0.7–3.1)
Lymphocytes: 34 %
MCH: 28.9 pg (ref 26.6–33.0)
MCHC: 33 g/dL (ref 31.5–35.7)
MCV: 88 fL (ref 79–97)
Monocytes Absolute: 0.6 10*3/uL (ref 0.1–0.9)
Monocytes: 9 %
Neutrophils Absolute: 3.3 10*3/uL (ref 1.4–7.0)
Neutrophils: 51 %
Platelets: 273 10*3/uL (ref 150–450)
RBC: 4.63 x10E6/uL (ref 3.77–5.28)
RDW: 13.3 % (ref 11.7–15.4)
WBC: 6.3 10*3/uL (ref 3.4–10.8)

## 2019-03-13 LAB — VITAMIN D,25 OH,TOTAL: Vitamin D 25-Hydroxy: 33.5 ng/mL (ref 30.0–100.0)

## 2019-03-23 ENCOUNTER — Telehealth (INDEPENDENT_AMBULATORY_CARE_PROVIDER_SITE_OTHER): Payer: Medicare Other | Admitting: Family Medicine

## 2019-03-23 ENCOUNTER — Encounter (INDEPENDENT_AMBULATORY_CARE_PROVIDER_SITE_OTHER): Payer: Self-pay | Admitting: Family Medicine

## 2019-03-23 VITALS — BP 127/86 | HR 91

## 2019-03-23 DIAGNOSIS — N1831 Chronic kidney disease, stage 3a: Secondary | ICD-10-CM

## 2019-03-23 DIAGNOSIS — I10 Essential (primary) hypertension: Secondary | ICD-10-CM

## 2019-03-23 DIAGNOSIS — E782 Mixed hyperlipidemia: Secondary | ICD-10-CM

## 2019-03-23 DIAGNOSIS — E039 Hypothyroidism, unspecified: Secondary | ICD-10-CM

## 2019-03-23 DIAGNOSIS — K219 Gastro-esophageal reflux disease without esophagitis: Secondary | ICD-10-CM

## 2019-03-23 DIAGNOSIS — R7303 Prediabetes: Secondary | ICD-10-CM

## 2019-03-23 DIAGNOSIS — K449 Diaphragmatic hernia without obstruction or gangrene: Secondary | ICD-10-CM

## 2019-03-23 DIAGNOSIS — E559 Vitamin D deficiency, unspecified: Secondary | ICD-10-CM

## 2019-03-23 MED ORDER — LEVOTHYROXINE SODIUM 75 MCG PO TABS
75.0000 ug | ORAL_TABLET | Freq: Every day | ORAL | 1 refills | Status: DC
Start: 2019-03-23 — End: 2019-10-05

## 2019-03-23 MED ORDER — ROSUVASTATIN CALCIUM 10 MG PO TABS
10.0000 mg | ORAL_TABLET | Freq: Every day | ORAL | 2 refills | Status: DC
Start: 2019-03-23 — End: 2019-06-28

## 2019-03-23 NOTE — Progress Notes (Signed)
[facility] - [place]                       Date of Virtual Visit: 03/23/2019 3:26 PM        Patient ID: Christine Bridges is a 68 y.o. female.  Attending Physician: Satira Mccallum, MD       Telemedicine Eligibility:    State Location:  [x]  [place]  []  [place]  []  [place] []  [place]  []  Other:    Physical Location:  [x]  Home  []         []        []          []  Other:    Patient Identity Verification:  [x]  State Issued ID  []  Insurance Eligibility Check  []  Other:    Physical Address Verification: (for 911)  [x]  Yes  []  No    Personal identity shared with patient:  [x]  Yes  []  No    Education on nature of video visit shared with patient:  [x]  Yes  []  No    Emergency plan agreed upon with patient:  [x]  Yes  []  No    If the patient had not had this virtual visit, what would they have done?  []         []         []        []          []  Other:    Visit terminated since not appropriate for virtual care:  [x]  N/A  []  Reason:         Chief Complaint:    Chief Complaint   Patient presents with    Hypertension    Lab Review               HPI:    68 y/o female presents today to discuss recent lab results and follow up on BP.     1. BP- checking a couple times per day.   BP avg 130s/80s but pt admits was checking BP without resting for a few mins.   Lowest reading was 116/70s.        Pt gave verbal consent to bill insurance for video visit.    2.L side arm tightness you get going up stairs, after eating happens  Saw cardiologist in past and negative workup    3. GERD: bad without omeprazole worse, need it, other meds do not help enough  .4. ckd: there for a long time  5. Lipids: been on simvastatin for a while          Problem List:    Patient Active Problem List   Diagnosis    GAD (generalized anxiety disorder)    Vitamin D deficiency    Fatigue, unspecified type    Mixed hyperlipidemia    Acquired hypothyroidism    Essential hypertension    GERD (gastroesophageal reflux  disease)    Hiatal hernia    Prediabetes    Stage 3a chronic kidney disease             Current Meds:    Outpatient Medications Marked as Taking for the 03/23/19 encounter (Telemedicine Visit) with Satira Mccallum, MD   Medication Sig Dispense Refill    BIOTIN PO Take by mouth      CALCIUM PO Take by mouth      cetirizine (ZyrTEC Allergy) 10 MG tablet every 24 hours      levothyroxine (SYNTHROID) 75  MCG tablet Take 1 tablet (75 mcg total) by mouth daily 90 tablet 1    metoprolol tartrate (LOPRESSOR) 50 MG tablet Take 1 tablet (50 mg total) by mouth daily 90 tablet 0    Multiple Vitamins-Minerals (MULTIVITAMIN ADULTS 50+ PO) multivitamin      omeprazole (PriLOSEC) 40 MG capsule Take 1 capsule (40 mg total) by mouth daily 30 capsule 0    Probiotic Product (PROBIOTIC PO) Take by mouth      VITAMIN D PO Take by mouth      [DISCONTINUED] levothyroxine (SYNTHROID) 75 MCG tablet Take 1 tablet by mouth once daily 90 tablet 0    [DISCONTINUED] levothyroxine (SYNTHROID) 75 MCG tablet Take 1 tablet (75 mcg total) by mouth daily 30 tablet 0    [DISCONTINUED] simvastatin (ZOCOR) 40 MG tablet Take 1 tablet (40 mg total) by mouth daily 30 tablet 0          Allergies:    Allergies   Allergen Reactions    Metronidazole              Past Surgical History:    Past Surgical History:   Procedure Laterality Date    ENDOSCOPY UPPER GI  2016    normal, done x2    OTHER SURGICAL HISTORY  06/2016    heart work up, stress test negative           Family History:    History reviewed. No pertinent family history.        Social History:    Social History     Tobacco Use    Smoking status: Former Smoker     Packs/day: 1.00    Smokeless tobacco: Never Used   Substance Use Topics    Alcohol use: Not on file    Drug use: Not on file          The following sections were reviewed this encounter by the provider:   Tobacco   Allergies   Meds   Problems   Med Hx   Surg Hx   Fam Hx              Vital Signs:    BP 127/86    Pulse 91           ROS:    Review of Systems   Wt gain,  Lside arm pain has been stable for years  Fatigue some better  Eating sweets a lot        Physical Exam:    Physical Exam   GENERAL APPEARANCE: alert, in no acute distress, pleasant, well nourished.   HEAD: normal appearance  EYES: no discharge  EARS: normal hearing  NECK/THYROID: appearance -supple  PSYCH: appropriate affect, appropriate mood, normal speech, normal attention        Assessment:    1. Essential hypertension  - levothyroxine (SYNTHROID) 75 MCG tablet; Take 1 tablet (75 mcg total) by mouth daily  Dispense: 90 tablet; Refill: 1    2. Gastroesophageal reflux disease without esophagitis    3. Acquired hypothyroidism    4. Hiatal hernia    5. Mixed hyperlipidemia  - rosuvastatin (CRESTOR) 10 MG tablet; Take 1 tablet (10 mg total) by mouth daily  Dispense: 30 tablet; Refill: 2    6. Prediabetes    7. Vitamin D deficiency    8. Stage 3a chronic kidney disease            Plan:    Discussed s.e PPI which may affect  kidney but she needs it  So monitor has been stable  Switch to rosuvastatin from simvastatin since ldl going up  Discussed labs in detail  Continue current other medicines  Side effect of meds discussed  Do healthy , high fiber, low cholesterol, low carb diet. Avoid sugars. Wt loss needed  Exercise most days    Follow up visit with [name] in 2-3 months            Follow-up:    Return in about 3 months (around 06/23/2019) for chronic care follow up, visit, fasting.         Satira Mccallum, MD

## 2019-04-10 ENCOUNTER — Other Ambulatory Visit (INDEPENDENT_AMBULATORY_CARE_PROVIDER_SITE_OTHER): Payer: Self-pay | Admitting: Family Medicine

## 2019-04-10 DIAGNOSIS — K219 Gastro-esophageal reflux disease without esophagitis: Secondary | ICD-10-CM

## 2019-05-07 ENCOUNTER — Encounter (INDEPENDENT_AMBULATORY_CARE_PROVIDER_SITE_OTHER): Payer: Self-pay | Admitting: Family Medicine

## 2019-05-07 ENCOUNTER — Telehealth (INDEPENDENT_AMBULATORY_CARE_PROVIDER_SITE_OTHER): Payer: Self-pay

## 2019-05-07 ENCOUNTER — Telehealth (INDEPENDENT_AMBULATORY_CARE_PROVIDER_SITE_OTHER): Payer: Medicare Other | Admitting: Family Medicine

## 2019-05-07 VITALS — Ht 64.5 in | Wt 190.0 lb

## 2019-05-07 DIAGNOSIS — H01004 Unspecified blepharitis left upper eyelid: Secondary | ICD-10-CM

## 2019-05-07 DIAGNOSIS — H01001 Unspecified blepharitis right upper eyelid: Secondary | ICD-10-CM

## 2019-05-07 DIAGNOSIS — H1033 Unspecified acute conjunctivitis, bilateral: Secondary | ICD-10-CM

## 2019-05-07 DIAGNOSIS — K635 Polyp of colon: Secondary | ICD-10-CM | POA: Insufficient documentation

## 2019-05-07 MED ORDER — CIPROFLOXACIN HCL 0.3 % OP SOLN
1.00 [drp] | OPHTHALMIC | 0 refills | Status: DC
Start: 2019-05-07 — End: 2020-06-03

## 2019-05-07 NOTE — Patient Instructions (Signed)
Blepharitis    Blepharitis is an inflammation of the eyelid. It results in swelling of the eyelids, and it is often caused by a bacterial infection or a skin condition. Blepharitis is a common eye condition. There are two types. Anterior blepharitis occurs where the eyelashes are attached (outside front edge of the eyelid). Posterior blepharitis affects the inner edge of the eyelid that touches the eyeball.  In addition to swollen eyelids, blepharitis symptoms can include thick, yellow, dandruff-like scales that stick to the eyelid. There may be oily patches on the eyelid. The eyelashes may be crusted (with dandruff-like scales) when you wake up from sleeping. The irritated area may itch. The eyelids may be red. The eyes can be red and burn or sting. The eyes may tear a lot, or be dry. You can become sensitive to light or have blurred vision. Symptoms of blepharitis can cause irritability.  Blepharitis is a chronic condition and hard to cure. Even with successful treatment, recurrences are common. Good hygiene and home treatments (in the Home care section below) can improve your condition.  Causes  Causes of blepharitis may include:   Problems with the oil glands in the eyelid (meibomian glands)   Dandruff of the scalp and eyebrows (seborrheic dermatitis)   Acne rosacea (a skin condition that causes redness of the face, and other symptoms)   Eyelash mites (tiny organisms in the eyelash follicles)   Allergic reactions to cosmetics or medicines  Home care  Medicine: The healthcare provider may prescribe an antibiotic eye drops or ointment, artificial tears, and/or steroid eye drops. Follow all instructions for using these medicines. Use all medicines as directed. If you have pain, take medicine as advised by the healthcare provider.   Wash your hands carefully with soap and warm water before and after caring for your eyes.   Apply a warm compress or a warm, moist washcloth to the eyelids for 1 minute, 2 to 3  times a day, to loosen the crust. Then, wipe away scales or crust from the eyelids.   After applying the warm compress, gently scrub the base of the eyelashes for almost 15 seconds per eyelid. To do this, close your eyes and use a moist eyelid cleansing wipe, clean washcloth, or cotton swab. Ask your healthcare provider about products (such as gentle baby shampoo) to use to help clean the eyelids.   You may be instructed to gently massage your eyelids to help unblock the eyelid glands. Follow all instructions given by the healthcare provider.   Unless told otherwise, on a regular basis, with eyes closed, clean your eyelids as directed by the healthcare provider. Blepharitis can be an ongoing problem.   Don't wear eye makeup until the inflammation goes away, or as directed by your healthcare provider.   Unless told otherwise, stop using contact lenses until you complete treatment for the condition.   Wash your hands regularly to help prevent dirt and bacteria from coming in contact with your eyelid.  Follow-up care  Follow up with your healthcare provider, or as advised. Your healthcare provider may refer you to an eye specialist (an optometrist or ophthalmologist) for further evaluation and treatment.  When to seek medical advice  Call your healthcare provider right away if any of these occur:   Increase in redness of the white part of the eye   Increase in swelling, redness, irritation, or pain of the eyelids   Eye pain   Change in vision (trouble seeing or blurring)     Drainage (pus, blood) from the eyelid   Fever of 100.4F (38C) or higher, or as directed by your healthcare provider  [employer] last reviewed this educational content on 08/12/2016   2000-2020 [employer] [address], [place] [address]. All rights reserved. This information is not intended as a substitute for professional medical care. Always follow your healthcare professional's instructions.

## 2019-05-07 NOTE — Progress Notes (Signed)
[name]                       Date of Virtual Visit: 05/07/2019 11:18 AM        Patient ID: Christine Bridges is a 68 y.o. female.  Attending Physician: Felipa Evener, MD       Telemedicine Eligibility:    State Location:  [x]  [place]  []  [employer] [place] []  [place]  []  Other:    Personal identity shared with patient:  [x]  Yes  []  No    Education on nature of video visit shared with patient, and patient agrees that this will be billed to the patient's insurance:  [x]  Yes  []  No         Chief Complaint:    Chief Complaint   Patient presents with    Conjunctivitis               HPI:    This encounter was a video visit. Verbal consent has been obtained from the patient to conduct this video visit encounter to minimize exposure to COVID-19. Patient was positively identified and was verified to be located in the state of [place] at the time of the visit     HPI     Consents to VV and billing  Christine Bridges is a 68 y.o. female Presents with Inner eye lid red on both eyes and mild redness on the conjunctiva b/l. This has been going on for about 2 days. She doesn't wear glasses or contact lens.   Symptoms are Worse on left side  She reported Some crusting of eyelid this morning.   Denies fever, pressure behind eyes or sensitivity to light  +discomfort with closing her eyes.           Problem List:    Patient Active Problem List   Diagnosis    GAD (generalized anxiety disorder)    Vitamin D deficiency    Fatigue, unspecified type    Mixed hyperlipidemia    Hypothyroidism    Benign essential hypertension    GERD (gastroesophageal reflux disease)    Hiatal hernia    Prediabetes    Chronic renal impairment    On long term drug therapy    Allergic rhinitis    Chest tightness    History of colonic polyps    Osteopenia    Palpitations    Osteoporosis    Polyp of colon    Rectal bleeding    Rosacea             Current Meds:     Outpatient Medications Marked as Taking for the 05/07/19 encounter (Telemedicine Visit) with Kebrina Friend, Melvenia Beam, MD   Medication Sig Dispense Refill    BIOTIN PO Take by mouth      CALCIUM PO Take by mouth      cetirizine (ZyrTEC Allergy) 10 MG tablet every 24 hours      levothyroxine (SYNTHROID) 75 MCG tablet Take 1 tablet (75 mcg total) by mouth daily 90 tablet 1    metoprolol tartrate (LOPRESSOR) 50 MG tablet Take 1 tablet (50 mg total) by mouth daily 90 tablet 0    Multiple Vitamins-Minerals (MULTIVITAMIN ADULTS 50+ PO) multivitamin      omeprazole (PriLOSEC) 40 MG capsule Take 1 capsule by mouth once daily 90 capsule 0    rosuvastatin (CRESTOR) 10 MG tablet Take 1 tablet (10 mg total) by mouth  daily 30 tablet 2    VITAMIN D PO Take by mouth            Allergies:    Allergies   Allergen Reactions    Metronidazole              Past Surgical History:    Past Surgical History:   Procedure Laterality Date    ENDOSCOPY UPPER GI  2016    normal, done x2    OTHER SURGICAL HISTORY  06/2016    heart work up, stress test negative           Family History:    History reviewed. No pertinent family history.        Social History:    Social History     Tobacco Use    Smoking status: Former Smoker     Packs/day: 1.00    Smokeless tobacco: Never Used   Substance Use Topics    Alcohol use: Not on file    Drug use: Not on file          The following sections were reviewed this encounter by the provider:   Tobacco   Allergies   Meds   Problems   Med Hx   Surg Hx   Fam Hx              Vital Signs:    Ht 1.638 m (5' 4.5")    Wt 86.2 kg (190 lb)    BMI 32.11 kg/m          ROS:    ROS  No High Fever  No Chest Pain  No Generalized Rash  +upper eye lids redness b/l  Denies light sensitivity   Denies eye pressure         Physical Exam:    Physical Exam   GENERAL APPEARANCE: alert, in no acute distress, pleasant, well nourished.   HEAD: normal appearance  EYES: no discharge, +erythema on the upper lids b/l, mild erythema  on the conjunctiva on left.   EARS: normal hearing  NECK/THYROID: appearance -supple  PSYCH: appropriate affect, appropriate mood, normal speech, normal attention        Assessment:    1. Acute bacterial conjunctivitis of both eyes  - ciprofloxacin (Ciloxan) 0.3 % ophthalmic solution; Place 1 drop into both eyes every 2 (two) hours Place 1 drop every 4 hours, while awake, for the next 5 days.  Dispense: 5 mL; Refill: 0    2. Blepharitis of upper eyelids of both eyes, unspecified type            Plan:    We discussed these concerns today, and pt was instructed to follow up if anything changes, new symptom development, or additional concerns.  Providers are available 24 hours a day for urgent issues, and in-person visits can be facilitated at anytime when appropriate.     Blepharitis - discussed treatment with warm compresses. Avoid putting on any make up. Discussed that this will likely resolve with conservative treatment.     Bacterial conjunctivitis - eye drops prescribed.     Follow up if not better.         Follow-up:    No follow-ups on file.         Melvenia Beam Chenille Toor, MD

## 2019-05-07 NOTE — Telephone Encounter (Signed)
Pharmacy advised Ciloxan 0.3% ophthalmic solution 1-2 drops into the conjuctival every two hours while awake followed by 1-2 drops every four hours while awake for the next 5 days in both eyes per Dr. Myrtie Hawk. Pharmacist states one 5ml bottle will not be enough. Advised okay to disp 2 bottles.

## 2019-06-01 ENCOUNTER — Other Ambulatory Visit (INDEPENDENT_AMBULATORY_CARE_PROVIDER_SITE_OTHER): Payer: Self-pay | Admitting: Family Medicine

## 2019-06-01 DIAGNOSIS — I10 Essential (primary) hypertension: Secondary | ICD-10-CM

## 2019-06-25 ENCOUNTER — Other Ambulatory Visit (INDEPENDENT_AMBULATORY_CARE_PROVIDER_SITE_OTHER): Payer: Self-pay | Admitting: Family Medicine

## 2019-06-25 DIAGNOSIS — E782 Mixed hyperlipidemia: Secondary | ICD-10-CM

## 2019-06-28 ENCOUNTER — Other Ambulatory Visit (INDEPENDENT_AMBULATORY_CARE_PROVIDER_SITE_OTHER): Payer: Self-pay | Admitting: Family Medicine

## 2019-06-28 DIAGNOSIS — E782 Mixed hyperlipidemia: Secondary | ICD-10-CM

## 2019-06-28 MED ORDER — ROSUVASTATIN CALCIUM 10 MG PO TABS
10.0000 mg | ORAL_TABLET | Freq: Every day | ORAL | 2 refills | Status: DC
Start: 2019-06-28 — End: 2019-11-05

## 2019-06-28 NOTE — Telephone Encounter (Signed)
Sent refill to pharmacy. 

## 2019-07-18 ENCOUNTER — Other Ambulatory Visit (INDEPENDENT_AMBULATORY_CARE_PROVIDER_SITE_OTHER): Payer: Self-pay | Admitting: Family Medicine

## 2019-07-18 DIAGNOSIS — K219 Gastro-esophageal reflux disease without esophagitis: Secondary | ICD-10-CM

## 2019-07-26 ENCOUNTER — Other Ambulatory Visit (INDEPENDENT_AMBULATORY_CARE_PROVIDER_SITE_OTHER): Payer: Self-pay | Admitting: Family Medicine

## 2019-07-26 DIAGNOSIS — K219 Gastro-esophageal reflux disease without esophagitis: Secondary | ICD-10-CM

## 2019-07-26 DIAGNOSIS — I10 Essential (primary) hypertension: Secondary | ICD-10-CM

## 2019-07-30 ENCOUNTER — Telehealth (INDEPENDENT_AMBULATORY_CARE_PROVIDER_SITE_OTHER): Payer: Self-pay | Admitting: Family Medicine

## 2019-07-30 DIAGNOSIS — K219 Gastro-esophageal reflux disease without esophagitis: Secondary | ICD-10-CM

## 2019-07-30 NOTE — Telephone Encounter (Signed)
Last Office Visit (Less than 6 months): 05/07/19  Prescriber: Dr. Jonathon Bellows  Medication:   Name: omeprazole (PriLOSEC) 40 MG capsule     How many days left of meds: 0  Pharmacy:   [facility] [address] ([address]), [place] - [phone] [phone] (Phone)       Comments: Patient Contact [phone]

## 2019-07-31 MED ORDER — OMEPRAZOLE 40 MG PO CPDR
40.00 mg | DELAYED_RELEASE_CAPSULE | Freq: Every day | ORAL | 0 refills | Status: DC
Start: 2019-07-31 — End: 2019-09-03

## 2019-07-31 NOTE — Telephone Encounter (Signed)
Pt due for chronic care follow up.  30-day refill sent to pharmacy.

## 2019-08-20 ENCOUNTER — Encounter (INDEPENDENT_AMBULATORY_CARE_PROVIDER_SITE_OTHER): Payer: Self-pay | Admitting: Family Medicine

## 2019-09-03 ENCOUNTER — Other Ambulatory Visit (INDEPENDENT_AMBULATORY_CARE_PROVIDER_SITE_OTHER): Payer: Self-pay | Admitting: Family Medicine

## 2019-09-03 DIAGNOSIS — I10 Essential (primary) hypertension: Secondary | ICD-10-CM

## 2019-09-03 DIAGNOSIS — K219 Gastro-esophageal reflux disease without esophagitis: Secondary | ICD-10-CM

## 2019-09-03 NOTE — Telephone Encounter (Signed)
LOV 05/07/2019 and she wa to RTO approx 09/01/2019.  Temp refill sent to pharmacy.  Patient notified due for follow up

## 2019-09-27 ENCOUNTER — Other Ambulatory Visit (INDEPENDENT_AMBULATORY_CARE_PROVIDER_SITE_OTHER): Payer: Self-pay

## 2019-09-27 DIAGNOSIS — K219 Gastro-esophageal reflux disease without esophagitis: Secondary | ICD-10-CM

## 2019-09-27 DIAGNOSIS — I10 Essential (primary) hypertension: Secondary | ICD-10-CM

## 2019-09-27 MED ORDER — OMEPRAZOLE 40 MG PO CPDR
DELAYED_RELEASE_CAPSULE | ORAL | 0 refills | Status: DC
Start: 2019-09-27 — End: 2019-11-05

## 2019-09-27 MED ORDER — METOPROLOL TARTRATE 50 MG PO TABS
50.0000 mg | ORAL_TABLET | Freq: Every day | ORAL | 0 refills | Status: DC
Start: 2019-09-27 — End: 2019-11-05

## 2019-09-27 NOTE — Telephone Encounter (Signed)
Received request for refills. Provided a temp refill. Due for visit for further refills. LOV 03/2019

## 2019-10-04 ENCOUNTER — Other Ambulatory Visit (INDEPENDENT_AMBULATORY_CARE_PROVIDER_SITE_OTHER): Payer: Self-pay | Admitting: Family Medicine

## 2019-10-04 DIAGNOSIS — I10 Essential (primary) hypertension: Secondary | ICD-10-CM

## 2019-10-04 DIAGNOSIS — E039 Hypothyroidism, unspecified: Secondary | ICD-10-CM

## 2019-10-05 NOTE — Telephone Encounter (Signed)
Last chronic med follow up was 03/23/2019 and note states to follow up in 3 months.  No visit scheduled.  Temp refill sent to pharmacy with note stating to follow up prior to next refill

## 2019-11-05 ENCOUNTER — Other Ambulatory Visit (INDEPENDENT_AMBULATORY_CARE_PROVIDER_SITE_OTHER): Payer: Self-pay | Admitting: Family Medicine

## 2019-11-05 DIAGNOSIS — I10 Essential (primary) hypertension: Secondary | ICD-10-CM

## 2019-11-05 DIAGNOSIS — E782 Mixed hyperlipidemia: Secondary | ICD-10-CM

## 2019-11-05 DIAGNOSIS — K219 Gastro-esophageal reflux disease without esophagitis: Secondary | ICD-10-CM

## 2019-11-05 DIAGNOSIS — E039 Hypothyroidism, unspecified: Secondary | ICD-10-CM

## 2019-11-09 ENCOUNTER — Ambulatory Visit (INDEPENDENT_AMBULATORY_CARE_PROVIDER_SITE_OTHER): Payer: Self-pay | Admitting: Family Medicine

## 2019-11-09 NOTE — Telephone Encounter (Signed)
Patient called on 11/09/19 stating she went to pharmacy to pick up rosuvastatin and medication was going to cost her out of pocket 18$.  She states she will call her insurance and see if there is a more cost effective option and call back with info.  She has appointment on 11/29/19 for medication follow up with Dr Jonathon Bellows

## 2019-11-20 ENCOUNTER — Telehealth (INDEPENDENT_AMBULATORY_CARE_PROVIDER_SITE_OTHER): Payer: Self-pay | Admitting: Family Medicine

## 2019-11-20 NOTE — Telephone Encounter (Signed)
Spoke with patient regarding her symptoms. She reports of intermittent sharp pain on her left ear x 3 days. Patient reports of some drainage but denied hearing loss and has not had any fever since onset of pain. Patient advised by clinical staff may use OTC otic solution to help temporarily, may also take tylenol / ibuprofen to help with the pain. Patient advised if symptoms becomes persisting, worse and unbearable, to seek urgent care otherwise, ok to keep appt.for next week. Patient expressed verbal understanding.

## 2019-11-20 NOTE — Telephone Encounter (Signed)
Name of Requester: Avia Merkley    Clinical Question: Patient has an appointment on 11/29/19 with Dr.KC Lilly, but right now having ear infection and would like to speak with the nurse for the advice about ear infection before her appointment.    Requesters preferred ph: [phone]  Please note: Please advise, Thanks!

## 2019-11-24 ENCOUNTER — Encounter (INDEPENDENT_AMBULATORY_CARE_PROVIDER_SITE_OTHER): Payer: Self-pay | Admitting: Family Medicine

## 2019-11-29 ENCOUNTER — Ambulatory Visit (FREE_STANDING_LABORATORY_FACILITY): Payer: Medicare Other | Admitting: Family Medicine

## 2019-11-29 ENCOUNTER — Encounter (INDEPENDENT_AMBULATORY_CARE_PROVIDER_SITE_OTHER): Payer: Self-pay | Admitting: Family Medicine

## 2019-11-29 ENCOUNTER — Encounter (INDEPENDENT_AMBULATORY_CARE_PROVIDER_SITE_OTHER): Payer: Self-pay

## 2019-11-29 ENCOUNTER — Telehealth (INDEPENDENT_AMBULATORY_CARE_PROVIDER_SITE_OTHER): Payer: Self-pay | Admitting: Family Medicine

## 2019-11-29 VITALS — BP 141/84 | HR 67 | Temp 97.9°F | Resp 12 | Ht 66.0 in | Wt 184.0 lb

## 2019-11-29 DIAGNOSIS — E782 Mixed hyperlipidemia: Secondary | ICD-10-CM

## 2019-11-29 DIAGNOSIS — R7303 Prediabetes: Secondary | ICD-10-CM

## 2019-11-29 DIAGNOSIS — I10 Essential (primary) hypertension: Secondary | ICD-10-CM

## 2019-11-29 DIAGNOSIS — R011 Cardiac murmur, unspecified: Secondary | ICD-10-CM

## 2019-11-29 DIAGNOSIS — Z1231 Encounter for screening mammogram for malignant neoplasm of breast: Secondary | ICD-10-CM

## 2019-11-29 DIAGNOSIS — M810 Age-related osteoporosis without current pathological fracture: Secondary | ICD-10-CM

## 2019-11-29 DIAGNOSIS — Z87891 Personal history of nicotine dependence: Secondary | ICD-10-CM

## 2019-11-29 DIAGNOSIS — K219 Gastro-esophageal reflux disease without esophagitis: Secondary | ICD-10-CM

## 2019-11-29 DIAGNOSIS — Z0000 Encounter for general adult medical examination without abnormal findings: Secondary | ICD-10-CM

## 2019-11-29 DIAGNOSIS — E039 Hypothyroidism, unspecified: Secondary | ICD-10-CM

## 2019-11-29 LAB — COMPREHENSIVE METABOLIC PANEL
ALT: 40 U/L (ref 0–55)
AST (SGOT): 34 U/L (ref 5–34)
Albumin/Globulin Ratio: 1.4 (ref 0.9–2.2)
Albumin: 4.1 g/dL (ref 3.5–5.0)
Alkaline Phosphatase: 89 U/L (ref 37–106)
Anion Gap: 9 (ref 5.0–15.0)
BUN: 22 mg/dL — ABNORMAL HIGH (ref 7.0–19.0)
Bilirubin, Total: 1 mg/dL (ref 0.2–1.2)
CO2: 25 mEq/L (ref 21–29)
Calcium: 9.7 mg/dL (ref 7.9–10.2)
Chloride: 107 mEq/L (ref 100–111)
Creatinine: 1.1 mg/dL (ref 0.4–1.5)
Globulin: 2.9 g/dL (ref 2.0–3.7)
Glucose: 111 mg/dL — ABNORMAL HIGH (ref 70–100)
Potassium: 5.1 mEq/L (ref 3.5–5.1)
Protein, Total: 7 g/dL (ref 6.0–8.3)
Sodium: 141 mEq/L (ref 136–145)

## 2019-11-29 LAB — LIPID PANEL
Cholesterol / HDL Ratio: 3.2
Cholesterol: 177 mg/dL (ref 0–199)
HDL: 56 mg/dL (ref 40–9999)
LDL Calculated: 101 mg/dL — ABNORMAL HIGH (ref 0–99)
Triglycerides: 99 mg/dL (ref 34–149)
VLDL Calculated: 20 mg/dL (ref 10–40)

## 2019-11-29 LAB — GFR: EGFR: 49.1

## 2019-11-29 LAB — HEMOLYSIS INDEX: Hemolysis Index: 6 (ref 0–18)

## 2019-11-29 LAB — HEMOGLOBIN A1C
Average Estimated Glucose: 116.9 mg/dL
Hemoglobin A1C: 5.7 % (ref 4.6–5.9)

## 2019-11-29 MED ORDER — METOPROLOL TARTRATE 50 MG PO TABS
50.0000 mg | ORAL_TABLET | Freq: Every day | ORAL | 1 refills | Status: DC
Start: 2019-11-29 — End: 2020-02-14

## 2019-11-29 NOTE — Telephone Encounter (Signed)
Sent via my chart for patient to be able to print.

## 2019-11-29 NOTE — Telephone Encounter (Signed)
Meant to give her echo referral and forgot. pls give to her, just ordered TY

## 2019-11-29 NOTE — Patient Instructions (Signed)
Womens Preventive Wellness Plan  Todays Date: November 29, 2019    Patient Name:Christine Bridges    Date of Birth: 1951-04-21     As part of your wellness benefit, Medicare makes many screening tests available to you at no charge.  A complete list of these tests can be found at their website, [email or web address]. However, many of these tests or recommendations are out of date, or may not apply to you. After careful consideration of your own personal health needs, the following testing is recommended for you:      Preventive Service    Up-to-date (UTD)/Due/Not Applicable (N/A)   Last Done   Medicare Frequency   Body Mass Index   Up-to-date November 29, 2019  (BMI):Body mass index is 29.7 kg/m.   Height:Height: 167.6 cm (5\' 6" )  Weight:Weight: 83.5 kg (184 lb)  Annually   Blood Pressure: Up-to-date November 29, 2019    BP: 141/84    Every 2 yrs, if BP </= 120/80 mm hg   Annually, if BP >120-139/80-89 mm hg   Cholesterol Testing Due Lab Results   Component Value Date    LDL 116 (H) 03/12/2019      Regularly beginning at age 40 with risk factors   Diabetes Screening Due Lab Results   Component Value Date    GLU 111 (H) 03/12/2019       If prediabetes, one screening every 6 months   Otherwise, one screening every 12 months with certain risk factors for diabetes   Osteoporosis Screening   (Bone Density Measurement)  Due   Routinely, for women aged 65+   Routinely, for women aged 3-64 with risk factors   Breast Cancer Screening   (Mammogram) Due   Every 2 yrs, aged 94-74 yrs   Cervical Cancer Screening   (Pap Smear)  Not applicable   Annually if at high risk for developing cervical or vaginal cancer or childbearing age with abnormal Pap test within past 3 years; Every 2 years for women at normal risk   HPV: Once every 5 years; All asymptomatic female Medicare beneficiaries aged 51 to 66 years   Colorectal Cancer Screening Up-to-date   Annually, Fecal  Occult Blood Stool (FOBS)   Every 5 yrs, Sigmoidoscopy with FOBS   Every 10 yrs, Colonoscopy   Every 3 yrs, Cologuard   Depression Screening Up-to-date November 29, 2019   As necessary for those with risk factors   Sexually Transmitted Diseases (STDs) & HIV Screening Declined   As necessary for those with risk factors   Alcohol Misuse Screening Not applicable   As necessary for those with risk factors   Immunizations:   Shingrix is due Immunization History   Administered Date(s) Administered    COVID-19 mRNA Vaccine Preservative Free 0.5 mL (MODERNA) 07/16/2019, 08/13/2019    DTaP 03/15/2011    INFLUENZA HIGH DOSE 65 YRS+ 04/29/2016, 03/21/2019    Influenza (Im) Preservative Free 02/13/2015    Influenza quadrivalent (IM) 6 months & up PRESERVED 04/14/2016, 03/14/2018    Influenza quadrivalent (IM) PF 3 Yrs & greater 03/07/2015, 03/14/2018    Pneumococcal 23 valent 04/14/2016    Pneumococcal Conjugate 13-Valent 04/29/2016    Tdap 06/14/2010, 03/15/2011     Prevnar 13: 1 dose after age 68   Pneumovax 23: 1 dose 1 year after Prevnar   Influenza: Annually   Advance Directive Due   Once; update as needed   Medical Nutrition Therapy Up-to-date   As necessary for diabetes or renal disease  Smoking Cessation Counseling Not applicable Counseling given: Not Answered  Comment: Most recent tobacco use screening: 09/30/2017.      Frequency: two cessation attempts per year.   Glaucoma Screening Up-to-date   Annually for covered high risk Medicare beneficiaries (one of the following: DM, FHx Glaucoma, African-Americans aged 32+, Hispanic-Americans aged 65+)   Hepatitis C Virus (HCV) Screening Not applicable   Annually only for high risk behavior   Once if born between [birth year] and 1965 and are not considered high risk   Lung Cancer Screening Due   Annually if asymptomatic, tobacco smoking history of at least 30 pack-years (one pack-year = smoking one pack per day for one year; 1 pack = 20 cigarettes), and current  smoker or one who has quit smoking within the last 15 years     Your major risk factors:       Prediabetes     Recommendations for improvement:    Low cholesterol diet, Exercise, Compliance with prescription medications, Low carb diet and Weight bearing exercises     Referrals:    See After Visit Summary orders

## 2019-11-29 NOTE — Progress Notes (Signed)
Hi Nurse/Clinical staff    Please advise patient I reviewed the results and below are the comments and recommendations:    Pls add tsh to lab , dx hypothyroidism TY      Thank you.  Dr. Jonathon Bellows

## 2019-11-29 NOTE — Progress Notes (Signed)
Subjective:      Patient ID: Christine Bridges is a 69 y.o. female.    Chief Complaint:  Chief Complaint   Patient presents with    Hypothyroidism    Hypertension    Medicare Annual Wellness Visit       HPI:  69 yo F presents for a     1) HTN  She is taking metoprolol as prescribed  She is checking BP at home and states it is normal range    2) Thyroid  She is taking Euthyrox as prescribed  She states no side effects from meds    Patient declines shingles vaccine    3. L ear pain: better now , 10 days ago hurt  R side throat sore    4. Dental implant: tongue is irritated daily    5. Gerd: want to try lower dose of  prilosec     6. Obesity: was up to 190lb  Has a sweet tooth    Problem List:  Patient Active Problem List   Diagnosis    GAD (generalized anxiety disorder)    Vitamin D deficiency    Fatigue, unspecified type    Mixed hyperlipidemia    Hypothyroidism    Benign essential hypertension    GERD (gastroesophageal reflux disease)    Hiatal hernia    Prediabetes    Chronic renal impairment    On long term drug therapy    Allergic rhinitis    Chest tightness    History of colonic polyps    Osteopenia    Palpitations    Osteoporosis    Polyp of colon    Rectal bleeding    Rosacea       Current Medications:  Current Outpatient Medications   Medication Sig Dispense Refill    BIOTIN PO Take by mouth      CALCIUM PO Take by mouth      cetirizine (ZyrTEC Allergy) 10 MG tablet every 24 hours      ciprofloxacin (Ciloxan) 0.3 % ophthalmic solution Place 1 drop into both eyes every 2 (two) hours Place 1 drop every 4 hours, while awake, for the next 5 days. 5 mL 0    Euthyrox 75 MCG tablet TAKE 1 TABLET BY MOUTH ONCE DAILY . APPOINTMENT REQUIRED FOR FUTURE REFILLS 30 tablet 0    metoprolol tartrate (LOPRESSOR) 50 MG tablet Take 1 tablet (50 mg total) by mouth daily 90 tablet 1    Multiple Vitamins-Minerals (MULTIVITAMIN ADULTS 50+ PO) multivitamin      omeprazole (PriLOSEC) 40 MG capsule  TAKE 1 CAPSULE BY MOUTH ONCE DAILY . APPOINTMENT REQUIRED FOR FUTURE REFILLS 90 capsule 0    rosuvastatin (CRESTOR) 10 MG tablet Take 1 tablet by mouth once daily 90 tablet 0    VITAMIN D PO Take by mouth       No current facility-administered medications for this visit.       Allergies:  Allergies   Allergen Reactions    Metronidazole        Past Medical History:  Past Medical History:   Diagnosis Date    Abnormal vision     Has had an annual eye exam in 2016 and wears glasses which are new.     Osteoporosis        Past Surgical History:  Past Surgical History:   Procedure Laterality Date    Back/Spine surgery  06/15/1983    COLONOSCOPY  07/06/2018    Repeat in 5 years.  COLONOSCOPY  04/14/2013    Normal.     ENDOSCOPY UPPER GI  2016    normal, done x2    FOOT SURGERY Left 05/14/2010    Left foot.     Oral/Dental surgery  12/12/2017    OTHER SURGICAL HISTORY  06/14/2016    heart work up, stress test negative.    WRIST FRACTURE SURGERY Left 04/14/2013    Left wrist fracture.        Family History:  Family History   Problem Relation Age of Onset    Stroke Mother         Cerebrovascular accident.     Alzheimer's disease Mother     Heart disease Father     Stroke Father         Cerebrovascular accident.     Heart disease Brother     Other Brother         Heart valve replacement.     Colon cancer Paternal Grandmother         Malignant tumor of colon.        Social History:  Social History     Socioeconomic History    Marital status: Unknown     Spouse name: Not on file    Number of children: Not on file    Years of education: Not on file    Highest education level: Not on file   Occupational History    Not on file   Tobacco Use    Smoking status: Former Smoker     Packs/day: 1.00     Years: 14.00     Pack years: 14.00     Quit date: 23     Years since quitting: 38.4    Smokeless tobacco: Never Used    Tobacco comment: Most recent tobacco use screening: 09/30/2017.     Vaping Use     Vaping Use: Never used   Substance and Sexual Activity    Alcohol use: Yes     Comment: One drink or less per week.     Drug use: Not on file    Sexual activity: Not on file   Other Topics Concern    Not on file   Social History Narrative    Not on file     Social Determinants of Health     Financial Resource Strain:     Difficulty of Paying Living Expenses:    Food Insecurity:     Worried About Running Out of Food in the Last Year:     Barista in the Last Year:    Transportation Needs:     Freight forwarder (Medical):     Lack of Transportation (Non-Medical):    Physical Activity:     Days of Exercise per Week:     Minutes of Exercise per Session:    Stress:     Feeling of Stress :    Social Connections:     Frequency of Communication with Friends and Family:     Frequency of Social Gatherings with Friends and Family:     Attends Religious Services:     Active Member of Clubs or Organizations:     Attends Banker Meetings:     Marital Status:    Intimate Partner Violence:     Fear of Current or Ex-Partner:     Emotionally Abused:     Physically Abused:     Sexually Abused:  The following sections were reviewed this encounter by the provider:        ROS:  Review of Systems    Vitals:  BP 141/84 (BP Site: Right arm, Patient Position: Sitting, Cuff Size: Large)    Pulse 67    Temp 97.9 F (36.6 C) (Tympanic)    Resp 12    Ht 1.676 m (5\' 6" )    Wt 83.5 kg (184 lb)    BMI 29.70 kg/m      Objective:     Physical Exam:  Physical Exam  Constitutional:       General: She is not in acute distress.  HENT:      Head: Atraumatic.      Right Ear: External ear normal. There is no impacted cerumen.      Left Ear: External ear normal. There is impacted cerumen (canal small bilat).      Mouth/Throat:      Pharynx: No oropharyngeal exudate or posterior oropharyngeal erythema.   Cardiovascular:      Rate and Rhythm: Regular rhythm.      Heart sounds: Murmur heard.     Pulmonary:       Effort: Pulmonary effort is normal.      Breath sounds: Normal breath sounds. No wheezing.   Abdominal:      Tenderness: There is no abdominal tenderness.   Musculoskeletal:         General: No swelling or tenderness.      Cervical back: Neck supple.   Neurological:      General: No focal deficit present.   Psychiatric:         Behavior: Behavior normal.          Assessment:     1. Routine general medical examination at a health care facility    2. Screening mammogram, encounter for  - Mammo Digital Screening Bilateral W Cad    3. Age related osteoporosis, unspecified pathological fracture presence  - Dxa Bone Density Axial Skeleton    4. Heart murmur  - Echocardiogram Adult Complete W Clr/ Dopp Waveform    5. Gastroesophageal reflux disease without esophagitis    6. Mixed hyperlipidemia  - Lipid panel    7. Acquired hypothyroidism    8. Benign essential hypertension  - metoprolol tartrate (LOPRESSOR) 50 MG tablet; Take 1 tablet (50 mg total) by mouth daily  Dispense: 90 tablet; Refill: 1    9. Prediabetes  - Comprehensive metabolic panel  - Hemoglobin A1C    10. Essential hypertension  - metoprolol tartrate (LOPRESSOR) 50 MG tablet; Take 1 tablet (50 mg total) by mouth daily  Dispense: 90 tablet; Refill: 1      Plan:   Ok to lower to prilosec 20mg  daily and see how she does    Continue current  Other medicines  Further refill after labs  Side effect of meds discussed  Do healthy , high fiber, low cholesterol, low carb diet, plan nutrition counselling appt soon  Exercise most days  Ear ok now, can use debrox ear drops if feeling plugged up    Follow up with dental specialist to cut down on tongue irritation    Satira Mccallum, MD

## 2019-11-29 NOTE — Progress Notes (Signed)
Christine Bridges is a 69 y.o. female who presents today for a Medicare Annual Wellness Visit.     Health Risk Assessment     During the past month, how would you rate your general health?:  Good  Which of the following tasks can you do without assistance - drive or take the bus alone; shop for groceries or clothes; prepare your own meals; do your own housework/laundry; handle your own finances/pay bills; eat, bathe or get around your home?:  Drive or take the bus alone, Do your own housework/laundry, Shop for groceries or clothes, Handle your own finances/pay bills, Prepare your own meals, Eat, bathe, dress or get around your home  Which of the following problems have you been bothered by in the past month - dizzy when standing up; problems using the phone; feeling tired or fatigued; moderate or severe body pain?: Feeling tired or fatigued  Do you exercise for about 20 minutes 3 or more days per week?:  No  During the past month was someone available to help if you needed and wanted help?  For example, if you felt nervous, lonely, got sick and had to stay in bed, needed someone to talk to, needed help with daily chores or needed help just taking care of yourself.: Yes  Do you always wear a seat belt?: Yes  Do you have any trouble taking medications the way you have been told to take them?: No  Have you been given any information that can help you with keeping track of your medications?: Yes  Do you have trouble paying for your medications?: No  Have you been given any information that can help you with hazards in your house, such as scatter rugs, furniture, etc?: Yes  Do you feel unsteady when standing or walking?: Yes  Do you worry about falling?: No  Have you fallen two or more times in the past year?: No  Did you suffer any injuries from your falls in the past year?: No    Additional Concerns    Patient Care Team:  Darcus Pester, FNP as PCP - General (Nurse Practitioner)    Past Medical History:    Diagnosis Date    Abnormal vision     Has had an annual eye exam in 2016 and wears glasses which are new.     Osteoporosis      Past Surgical History:   Procedure Laterality Date    Back/Spine surgery  06/15/1983    COLONOSCOPY  07/06/2018    Repeat in 5 years.     COLONOSCOPY  04/14/2013    Normal.     ENDOSCOPY UPPER GI  2016    normal, done x2    FOOT SURGERY Left 05/14/2010    Left foot.     Oral/Dental surgery  12/12/2017    OTHER SURGICAL HISTORY  06/14/2016    heart work up, stress test negative.    WRIST FRACTURE SURGERY Left 04/14/2013    Left wrist fracture.      Allergies   Allergen Reactions    Metronidazole       Current Outpatient Medications   Medication Sig Dispense Refill    BIOTIN PO Take by mouth      CALCIUM PO Take by mouth      cetirizine (ZyrTEC Allergy) 10 MG tablet every 24 hours      ciprofloxacin (Ciloxan) 0.3 % ophthalmic solution Place 1 drop into both eyes every 2 (two) hours Place 1 drop every 4  hours, while awake, for the next 5 days. 5 mL 0    Euthyrox 75 MCG tablet TAKE 1 TABLET BY MOUTH ONCE DAILY . APPOINTMENT REQUIRED FOR FUTURE REFILLS 30 tablet 0    metoprolol tartrate (LOPRESSOR) 50 MG tablet Take 1 tablet by mouth once daily 90 tablet 0    Multiple Vitamins-Minerals (MULTIVITAMIN ADULTS 50+ PO) multivitamin      omeprazole (PriLOSEC) 40 MG capsule TAKE 1 CAPSULE BY MOUTH ONCE DAILY . APPOINTMENT REQUIRED FOR FUTURE REFILLS 90 capsule 0    rosuvastatin (CRESTOR) 10 MG tablet Take 1 tablet by mouth once daily 90 tablet 0    VITAMIN D PO Take by mouth       No current facility-administered medications for this visit.      Social History     Tobacco Use    Smoking status: Former Smoker     Packs/day: 1.00     Years: 14.00     Pack years: 14.00     Quit date: 1983     Years since quitting: 38.4    Smokeless tobacco: Never Used    Tobacco comment: Most recent tobacco use screening: 09/30/2017.     Vaping Use    Vaping Use: Never used   Substance Use  Topics    Alcohol use: Yes     Comment: One drink or less per week.     Drug use: Not on file      Family History   Problem Relation Age of Onset    Stroke Mother         Cerebrovascular accident.     Alzheimer's disease Mother     Heart disease Father     Stroke Father         Cerebrovascular accident.     Heart disease Brother     Other Brother         Heart valve replacement.     Colon cancer Paternal Grandmother         Malignant tumor of colon.         The following sections were reviewed this encounter by the provider:        Hospitalizations  no hospitalizations within past year    Depression Screening    See related Activity or Flowsheet    Functional Ability    Falls Risk:  home does not have throw rugs, poor lighting or a slippery bath tub or shower  Hearing:  hearing within normal limits  Exercise:  no exercise  ADL's:   Bathing - independent   Dressing - independent   Mobility - independent   Transfer - independent   Eating - independent}   Toileting - independent   ADL assistance not needed    Discussion of Advance Directives: Has no Advanced Directive. Form provided.     Assessment    BP 141/84 (BP Site: Right arm, Patient Position: Sitting, Cuff Size: Large)    Pulse 67    Temp 97.9 F (36.6 C) (Tympanic)    Resp 12    Ht 1.676 m (5\' 6" )    Wt 83.5 kg (184 lb)    BMI 29.70 kg/m      Vision Screening (required for IPPE only): Patient states eye exam performed elsewhere within past 12 months  (NOTE: Vision screening may NOT be deferred for Welcome to Medicare visit)    Screening EKG (IPPE only): not indicated        Evaluation of Cognitive Function  Mood/affect: Appropriate  Appearance: neatly groomed, appropriately and adequately nourished  Family member/caregiver input: Not present        AWV Mini-Cog Result:  1-2 recalled words and normal clock draw - negative for cognitive impairment    Assessment/Plan    There are no diagnoses linked to this encounter.     Cathi Roan, MA    11/29/2019

## 2019-12-02 ENCOUNTER — Other Ambulatory Visit (INDEPENDENT_AMBULATORY_CARE_PROVIDER_SITE_OTHER): Payer: Self-pay | Admitting: Family Medicine

## 2019-12-02 ENCOUNTER — Encounter (INDEPENDENT_AMBULATORY_CARE_PROVIDER_SITE_OTHER): Payer: Self-pay | Admitting: Family Medicine

## 2019-12-02 DIAGNOSIS — E782 Mixed hyperlipidemia: Secondary | ICD-10-CM

## 2019-12-02 MED ORDER — ROSUVASTATIN CALCIUM 10 MG PO TABS
10.0000 mg | ORAL_TABLET | Freq: Every day | ORAL | 3 refills | Status: DC
Start: 2019-12-02 — End: 2020-02-14

## 2019-12-02 NOTE — Progress Notes (Signed)
Hello Ms.  Pisani:    I have reviewed your results.    Prediabetes there with sugar fasting 111  Normal liver, kidneys  Cholesterol better! Sending med  rosuvastatin for next 6 months  Mild chronic kidney disease there, slight worse. Need to discuss on 12/10/19 ways to protect kidneys . Maximize low carb diet and exercise  Sorry thyroid was missed. Please make lab visit to get it done, could not add      Take good care and practice healthy habits  Dr. Jonathon Bellows

## 2019-12-10 ENCOUNTER — Telehealth (INDEPENDENT_AMBULATORY_CARE_PROVIDER_SITE_OTHER): Payer: Medicare Other | Admitting: Family Medicine

## 2019-12-10 ENCOUNTER — Encounter (INDEPENDENT_AMBULATORY_CARE_PROVIDER_SITE_OTHER): Payer: Self-pay | Admitting: Family Medicine

## 2019-12-10 VITALS — Wt 182.0 lb

## 2019-12-10 DIAGNOSIS — E663 Overweight: Secondary | ICD-10-CM

## 2019-12-10 DIAGNOSIS — R7303 Prediabetes: Secondary | ICD-10-CM

## 2019-12-10 DIAGNOSIS — E039 Hypothyroidism, unspecified: Secondary | ICD-10-CM

## 2019-12-10 DIAGNOSIS — E782 Mixed hyperlipidemia: Secondary | ICD-10-CM

## 2019-12-10 DIAGNOSIS — N1831 Chronic kidney disease, stage 3a: Secondary | ICD-10-CM

## 2019-12-10 MED ORDER — LEVOTHYROXINE SODIUM 75 MCG PO TABS
ORAL_TABLET | ORAL | 0 refills | Status: DC
Start: 2019-12-10 — End: 2020-01-02

## 2019-12-10 NOTE — Progress Notes (Signed)
[facility] - [place]                       Date of Virtual Visit: 12/10/2019 11:24 AM        Patient ID: Christine Bridges is a 69 y.o. female.  Attending Physician: Satira Mccallum, MD       Telemedicine Eligibility:    State Location:  [x]  [place]  []  [place]  []  [place] []  [place]  []  Other:    Physical Location:  [x]  Home  []         []        []          []  Other:    Patient Identity Verification:  [x]  State Issued ID  []  Insurance Eligibility Check  []  Other:    Physical Address Verification: (for 911)  [x]  Yes  []  No    Personal identity shared with patient:  [x]  Yes  []  No    Education on nature of video visit shared with patient:  [x]  Yes  []  No    Emergency plan agreed upon with patient:  [x]  Yes  []  No    If the patient had not had this virtual visit, what would they have done?  []         []         []        []          []  Other:    Visit terminated since not appropriate for virtual care:  [x]  N/A  []  Reason:         Chief Complaint:    Chief Complaint   Patient presents with    Weight Loss    Obesity               HPI:    Consent given to bill insurance for video visit      69 yo F presents for a need to discuss    1) Weight loss  She is trying to manage diet and is discuss with daughter special diet  She states she wanted to consult with PCP on weight loss techniques    2) lab results   She has blood work from 11/29/19 she wanted PCP advice on results    3. Hypothyroid: do not feel its symptoms. On chart review tsh has been stable    Wake up at  8-8:30 am take pills with water  Have coffee: with flavored creamer  Breakfast: 9:30-10: have 2 eggs, 2 bacon, one slice of raisin muffin with marmalade  Snack:no snack  Lunch: sometimes no lunch, having salami, cheese, ham  Snack: snack:chips potato chips    Dinner: 6:30 pm: virta:  Cauliflower, potatoes  Meat, hamburger, pork chops    Dessert:usually have, creamcheese made at home  Around 9 pm  Small coke  regular            Problem List:    Patient Active Problem List   Diagnosis    GAD (generalized anxiety disorder)    Vitamin D deficiency    Fatigue, unspecified type    Mixed hyperlipidemia    Hypothyroidism    Benign essential hypertension    GERD (gastroesophageal reflux disease)    Hiatal hernia    Prediabetes    Stage 3a chronic kidney disease    On long term drug therapy    Allergic rhinitis    History of colonic polyps    Osteopenia  Palpitations    Osteoporosis    Polyp of colon    Rosacea    Overweight (BMI 25.0-29.9)             Current Meds:    Outpatient Medications Marked as Taking for the 12/10/19 encounter (Telemedicine Visit) with Satira Mccallum, MD   Medication Sig Dispense Refill    BIOTIN PO Take by mouth      CALCIUM PO Take by mouth      cetirizine (ZyrTEC Allergy) 10 MG tablet every 24 hours      levothyroxine (Euthyrox) 75 MCG tablet TAKE 1 TABLET BY MOUTH ONCE DAILY 90 tablet 0    metoprolol tartrate (LOPRESSOR) 50 MG tablet Take 1 tablet (50 mg total) by mouth daily 90 tablet 1    Multiple Vitamins-Minerals (MULTIVITAMIN ADULTS 50+ PO) multivitamin      omeprazole (PriLOSEC) 40 MG capsule TAKE 1 CAPSULE BY MOUTH ONCE DAILY . APPOINTMENT REQUIRED FOR FUTURE REFILLS 90 capsule 0    rosuvastatin (CRESTOR) 10 MG tablet Take 1 tablet (10 mg total) by mouth daily 90 tablet 3    VITAMIN D PO Take by mouth      [DISCONTINUED] Euthyrox 75 MCG tablet TAKE 1 TABLET BY MOUTH ONCE DAILY . APPOINTMENT REQUIRED FOR FUTURE REFILLS 30 tablet 0          Allergies:    Allergies   Allergen Reactions    Metronidazole              Past Surgical History:    Past Surgical History:   Procedure Laterality Date    Back/Spine surgery  06/15/1983    COLONOSCOPY  07/06/2018    Repeat in 5 years.     COLONOSCOPY  04/14/2013    Normal.     ENDOSCOPY UPPER GI  2016    normal, done x2    FOOT SURGERY Left 05/14/2010    Left foot.     Oral/Dental surgery  12/12/2017    OTHER SURGICAL HISTORY   06/14/2016    heart work up, stress test negative.    WRIST FRACTURE SURGERY Left 04/14/2013    Left wrist fracture.            Family History:    Family History   Problem Relation Age of Onset    Stroke Mother         Cerebrovascular accident.     Alzheimer's disease Mother     Heart disease Father     Stroke Father         Cerebrovascular accident.     Heart disease Brother     Other Brother         Heart valve replacement.     Colon cancer Paternal Grandmother         Malignant tumor of colon.            Social History:    Social History     Socioeconomic History    Marital status: Unknown     Spouse name: Not on file    Number of children: Not on file    Years of education: Not on file    Highest education level: Not on file   Occupational History    Not on file   Tobacco Use    Smoking status: Former Smoker     Packs/day: 1.00     Years: 14.00     Pack years: 14.00     Quit date: 8  Years since quitting: 38.5    Smokeless tobacco: Never Used    Tobacco comment: Most recent tobacco use screening: 09/30/2017.     Vaping Use    Vaping Use: Never used   Substance and Sexual Activity    Alcohol use: Yes     Comment: One drink or less per week.     Drug use: Not on file    Sexual activity: Not on file   Other Topics Concern    Not on file   Social History Narrative    Exercise: none, walking in the house     Social Determinants of Health     Financial Resource Strain:     Difficulty of Paying Living Expenses:    Food Insecurity:     Worried About Programme researcher, broadcasting/film/video in the Last Year:     Barista in the Last Year:    Transportation Needs:     Freight forwarder (Medical):     Lack of Transportation (Non-Medical):    Physical Activity:     Days of Exercise per Week:     Minutes of Exercise per Session:    Stress:     Feeling of Stress :    Social Connections:     Frequency of Communication with Friends and Family:     Frequency of Social Gatherings with Friends and  Family:     Attends Religious Services:     Active Member of Clubs or Organizations:     Attends Banker Meetings:     Marital Status:    Intimate Partner Violence:     Fear of Current or Ex-Partner:     Emotionally Abused:     Physically Abused:     Sexually Abused:                   Vital Signs:    Wt 82.6 kg (182 lb)    BMI 29.38 kg/m          ROS:    Review of Systems           Physical Exam:    Physical Exam   GENERAL APPEARANCE: alert, in no acute distress, pleasant, well nourished.   HEAD: normal appearance  EYES: no discharge  EARS: normal hearing  NECK/THYROID: appearance -supple  PSYCH: appropriate affect, appropriate mood, normal speech, normal attention        Assessment:    1. Hypothyroidism, unspecified type  - levothyroxine (Euthyrox) 75 MCG tablet; TAKE 1 TABLET BY MOUTH ONCE DAILY  Dispense: 90 tablet; Refill: 0    2. Stage 3a chronic kidney disease    3. Mixed hyperlipidemia    4. Prediabetes    5. Overweight (BMI 25.0-29.9)            Plan:    Continue current medicines  Side effect of meds discussed  Do healthy , high fiber, low cholesterol, low carb diet  Exercise most days    Follow up visit with [name] in 3 months    I discussed low carb diet in detail to avoid diabetes, heart disease and other obesity related diseases. It helps lose weight, cut down on inflammation and improve energy. I reviewed the allowed food list and foods to avoid or eat less and provided the list.  I advised to aim for 20-30 grams of protein 3xday with 5-10 grams of protein snacks in between. Goal is to provide body with  adequate protein to give adequate nutrition and to feel full so that for the extra calories the body needs, it cuts down on its own fat.  I encourage one lb/week weight loss. Check weight at least once a week.  Exercise most days at least 30 minutes.  30 minutes spent counselling and documenting                        Follow-up:    Return in about  3 months (around 03/11/2020) for chronic care follow up, visit, weight, thyroid, a1c.         Satira Mccallum, MD

## 2020-01-02 ENCOUNTER — Other Ambulatory Visit (INDEPENDENT_AMBULATORY_CARE_PROVIDER_SITE_OTHER): Payer: Self-pay | Admitting: Family Medicine

## 2020-01-02 DIAGNOSIS — E039 Hypothyroidism, unspecified: Secondary | ICD-10-CM

## 2020-01-02 NOTE — Telephone Encounter (Signed)
Refill Request:    Last Office Visit (Less than 6 months): 12/10/2019  Prescriber: Dr. Jonathon Bellows  Medication:   Name: levothyroxine (Euthyrox) 75 MCG tablet   Dose: take 1 tablet daily   Directions:   How many days left of meds: patient lost medication and needs new prescription per pharmacy  Pharmacy: [facility] [address] ([address]), [place] - [phone]  Comments: Per patient even just a 1 month supply would be good, she is looking for her medication but has now gone a few days without it.

## 2020-02-13 ENCOUNTER — Other Ambulatory Visit (INDEPENDENT_AMBULATORY_CARE_PROVIDER_SITE_OTHER): Payer: Self-pay | Admitting: Family Medicine

## 2020-02-13 DIAGNOSIS — K219 Gastro-esophageal reflux disease without esophagitis: Secondary | ICD-10-CM

## 2020-02-13 DIAGNOSIS — I10 Essential (primary) hypertension: Secondary | ICD-10-CM

## 2020-02-13 DIAGNOSIS — E782 Mixed hyperlipidemia: Secondary | ICD-10-CM

## 2020-02-19 ENCOUNTER — Other Ambulatory Visit (INDEPENDENT_AMBULATORY_CARE_PROVIDER_SITE_OTHER): Payer: Self-pay | Admitting: Family Medicine

## 2020-04-04 ENCOUNTER — Other Ambulatory Visit (INDEPENDENT_AMBULATORY_CARE_PROVIDER_SITE_OTHER): Payer: Self-pay | Admitting: Family Medicine

## 2020-04-04 DIAGNOSIS — E039 Hypothyroidism, unspecified: Secondary | ICD-10-CM

## 2020-04-04 NOTE — Telephone Encounter (Signed)
Last visit was 11/29/19 and patient is due for follow up.  Temp refill sent in with note to follow up

## 2020-04-29 ENCOUNTER — Other Ambulatory Visit (INDEPENDENT_AMBULATORY_CARE_PROVIDER_SITE_OTHER): Payer: Self-pay | Admitting: Family Medicine

## 2020-04-29 DIAGNOSIS — E039 Hypothyroidism, unspecified: Secondary | ICD-10-CM

## 2020-04-29 NOTE — Telephone Encounter (Signed)
Temp refill sent in for patient.  appt made for 06/03/20

## 2020-05-02 ENCOUNTER — Encounter (INDEPENDENT_AMBULATORY_CARE_PROVIDER_SITE_OTHER): Payer: Self-pay | Admitting: Family Medicine

## 2020-05-05 ENCOUNTER — Encounter (INDEPENDENT_AMBULATORY_CARE_PROVIDER_SITE_OTHER): Payer: Self-pay | Admitting: Family Medicine

## 2020-05-06 NOTE — Progress Notes (Signed)
Hi Nurse/Clinical staff    Please advise patient I reviewed the results and below are the comments and recommendations:   Got mammo report incomplete . Contact mammo department for more films. Give referral if needed     Thank you.  Dr. Jonathon Bellows

## 2020-05-09 NOTE — Progress Notes (Signed)
Hello Ms.  Mas:    I have reviewed your results of bone density    Just slight thin bones, no osteopenia  Weight bearing exercise, adequate vitamin d3 and calcium 1200mg  from food to get daily to keep bones strong      Take good care and practice healthy habits  Dr. Jonathon Bellows

## 2020-05-20 ENCOUNTER — Telehealth (INDEPENDENT_AMBULATORY_CARE_PROVIDER_SITE_OTHER): Payer: Self-pay | Admitting: Family Medicine

## 2020-05-20 NOTE — Telephone Encounter (Signed)
Patient called to request that her echocardiogram referral be faxed to [facility] (Dr. Morey Hummingbird) .    Fax. [phone]  Ph. [phone]

## 2020-05-21 ENCOUNTER — Encounter (INDEPENDENT_AMBULATORY_CARE_PROVIDER_SITE_OTHER): Payer: Self-pay | Admitting: Family Medicine

## 2020-06-03 ENCOUNTER — Encounter (INDEPENDENT_AMBULATORY_CARE_PROVIDER_SITE_OTHER): Payer: Self-pay | Admitting: Family Medicine

## 2020-06-03 ENCOUNTER — Other Ambulatory Visit (INDEPENDENT_AMBULATORY_CARE_PROVIDER_SITE_OTHER): Payer: Self-pay | Admitting: Family Medicine

## 2020-06-03 ENCOUNTER — Ambulatory Visit (INDEPENDENT_AMBULATORY_CARE_PROVIDER_SITE_OTHER): Payer: Medicare Other | Admitting: Family Medicine

## 2020-06-03 VITALS — BP 120/68 | HR 92 | Temp 96.5°F | Resp 12 | Ht 66.0 in | Wt 179.0 lb

## 2020-06-03 DIAGNOSIS — Z9189 Other specified personal risk factors, not elsewhere classified: Secondary | ICD-10-CM

## 2020-06-03 DIAGNOSIS — N1831 Chronic kidney disease, stage 3a: Secondary | ICD-10-CM

## 2020-06-03 DIAGNOSIS — R7303 Prediabetes: Secondary | ICD-10-CM

## 2020-06-03 DIAGNOSIS — M170 Bilateral primary osteoarthritis of knee: Secondary | ICD-10-CM | POA: Insufficient documentation

## 2020-06-03 DIAGNOSIS — E782 Mixed hyperlipidemia: Secondary | ICD-10-CM

## 2020-06-03 DIAGNOSIS — I10 Essential (primary) hypertension: Secondary | ICD-10-CM

## 2020-06-03 DIAGNOSIS — Z1159 Encounter for screening for other viral diseases: Secondary | ICD-10-CM

## 2020-06-03 DIAGNOSIS — M8580 Other specified disorders of bone density and structure, unspecified site: Secondary | ICD-10-CM

## 2020-06-03 DIAGNOSIS — K219 Gastro-esophageal reflux disease without esophagitis: Secondary | ICD-10-CM

## 2020-06-03 LAB — COMPREHENSIVE METABOLIC PANEL
ALT: 35 U/L (ref 0–55)
AST (SGOT): 30 U/L (ref 5–34)
Albumin/Globulin Ratio: 1.4 (ref 0.9–2.2)
Albumin: 4.6 g/dL (ref 3.5–5.0)
Alkaline Phosphatase: 79 U/L (ref 37–117)
Anion Gap: 9 (ref 5.0–15.0)
BUN: 22 mg/dL — ABNORMAL HIGH (ref 7.0–19.0)
Bilirubin, Total: 1.4 mg/dL — ABNORMAL HIGH (ref 0.2–1.2)
CO2: 27 mEq/L (ref 21–29)
Calcium: 10.4 mg/dL — ABNORMAL HIGH (ref 7.9–10.2)
Chloride: 104 mEq/L (ref 100–111)
Creatinine: 1.1 mg/dL (ref 0.4–1.5)
Globulin: 3.2 g/dL (ref 2.0–3.7)
Glucose: 103 mg/dL — ABNORMAL HIGH (ref 70–100)
Potassium: 4.9 mEq/L (ref 3.5–5.1)
Protein, Total: 7.8 g/dL (ref 6.0–8.3)
Sodium: 140 mEq/L (ref 136–145)

## 2020-06-03 LAB — LIPID PANEL
Cholesterol / HDL Ratio: 3.2
Cholesterol: 201 mg/dL — ABNORMAL HIGH (ref 0–199)
HDL: 63 mg/dL (ref 40–9999)
LDL Calculated: 113 mg/dL — ABNORMAL HIGH (ref 0–99)
Triglycerides: 125 mg/dL (ref 34–149)
VLDL Calculated: 25 mg/dL (ref 10–40)

## 2020-06-03 LAB — HEMOLYSIS INDEX: Hemolysis Index: 10 (ref 0–24)

## 2020-06-03 LAB — MICROALBUMIN, RANDOM URINE
Urine Creatinine, Random: 56.6 mg/dL
Urine Microalbumin, Random: 17 (ref 0.0–30.0)
Urine Microalbumin/Creatinine Ratio: 30 ug/mg (ref 0–30)

## 2020-06-03 LAB — HEPATITIS C ANTIBODY: Hepatitis C, AB: NONREACTIVE

## 2020-06-03 LAB — POCT HEMOGLOBIN A1C: POCT Hgb A1C: 5.6 % (ref 3.9–5.9)

## 2020-06-03 LAB — GFR: EGFR: 49

## 2020-06-03 MED ORDER — OMEPRAZOLE 20 MG PO CPDR
20.0000 mg | DELAYED_RELEASE_CAPSULE | Freq: Every day | ORAL | 1 refills | Status: DC
Start: 2020-06-03 — End: 2020-06-23

## 2020-06-03 MED ORDER — METOPROLOL TARTRATE 50 MG PO TABS
50.0000 mg | ORAL_TABLET | Freq: Every day | ORAL | 1 refills | Status: DC
Start: 2020-06-03 — End: 2020-12-19

## 2020-06-03 MED ORDER — OMEPRAZOLE 20 MG PO CPDR
20.0000 mg | DELAYED_RELEASE_CAPSULE | Freq: Every day | ORAL | 5 refills | Status: DC
Start: 2020-06-03 — End: 2020-06-03

## 2020-06-03 MED ORDER — ROSUVASTATIN CALCIUM 10 MG PO TABS
10.0000 mg | ORAL_TABLET | Freq: Every day | ORAL | 1 refills | Status: DC
Start: 2020-06-03 — End: 2020-06-03

## 2020-06-03 MED ORDER — ROSUVASTATIN CALCIUM 20 MG PO TABS
20.0000 mg | ORAL_TABLET | Freq: Every evening | ORAL | 1 refills | Status: DC
Start: 2020-06-03 — End: 2020-11-27

## 2020-06-03 NOTE — Progress Notes (Signed)
Hi Nurse/Clinical staff    Please advise patient I reviewed the results and below are the comments and recommendations:    Cholesterol gone up some so I have raised rosuvastatin to 20mg  daily and sent it. Pick up that and not 10mg    Screen for hep c negative  Normal urine  Chronic kidney dz stable  Fasting sugar slight up.   Recheck fasting office visit in 3 months to recheck cholesterol and on liver and kideys. Calcium minimally high and need to monitor    Thank you.  Dr. Jonathon Bellows

## 2020-06-03 NOTE — Progress Notes (Signed)
Subjective:      Patient ID: Christine Bridges is a 69 y.o. female.    Chief Complaint:  Chief Complaint   Patient presents with    Hypertension    Thyroid Problem    Hyperlipidemia       HPI:    69 yo F presents for a follow up    1) HTN  She is taking metoprolol as prescribed  She states she is not checking BP regularly  She has appt with cardio for echo and stress test    2) thyroid  She is taking euthyrox as prescribed  She states she is fasting for blood work    3) cholesterol  She is taking rosuvastatin as prescribed  She states she is trying to manage diet (she is doing diet program that is "half keto")  She has lost 3lbs since last visit    4. Prediabetes: wt 184lbs in 11/2019  5.6 today    Knee pain bilat seeing ortho and rheum  Getting cortisone injection     5. Stool urgency and incontinence twice this year and bowel changing    Problem List:  Patient Active Problem List   Diagnosis    GAD (generalized anxiety disorder)    Vitamin D deficiency    Fatigue, unspecified type    Mixed hyperlipidemia    Hypothyroidism    Benign essential hypertension    GERD (gastroesophageal reflux disease)    Hiatal hernia    Prediabetes    Stage 3a chronic kidney disease    On long term drug therapy    Allergic rhinitis    History of colonic polyps    Osteopenia    Palpitations    Polyp of colon    Rosacea    Overweight (BMI 25.0-29.9)    Primary osteoarthritis of both knees       Current Medications:  Current Outpatient Medications   Medication Sig Dispense Refill    BIOTIN PO Take by mouth      CALCIUM PO Take by mouth      cetirizine (ZyrTEC Allergy) 10 MG tablet every 24 hours      Euthyrox 75 MCG tablet Take 1 tablet by mouth once daily 30 tablet 0    metoprolol tartrate (LOPRESSOR) 50 MG tablet Take 1 tablet (50 mg total) by mouth daily 90 tablet 1    Multiple Vitamins-Minerals (MULTIVITAMIN ADULTS 50+ PO) multivitamin      rosuvastatin (CRESTOR) 10 MG tablet Take 1 tablet (10 mg total)  by mouth daily 90 tablet 1    VITAMIN D PO Take by mouth      omeprazole (PriLOSEC) 20 MG capsule Take 1 capsule (20 mg total) by mouth daily 30 capsule 5     No current facility-administered medications for this visit.       Allergies:  Allergies   Allergen Reactions    Metronidazole        Past Medical History:  Past Medical History:   Diagnosis Date    Abnormal vision     Has had an annual eye exam in 2016 and wears glasses which are new.     Chest tightness 02/21/2013    Osteoporosis     Rectal bleeding 02/21/2013       Past Surgical History:  Past Surgical History:   Procedure Laterality Date    Back/Spine surgery  06/15/1983    COLONOSCOPY  07/06/2018    Repeat in 5 years.     COLONOSCOPY  04/14/2013  Normal.     ENDOSCOPY UPPER GI  2016    normal, done x2    FOOT SURGERY Left 05/14/2010    Left foot.     Oral/Dental surgery  12/12/2017    OTHER SURGICAL HISTORY  06/14/2016    heart work up, stress test negative.    WRIST FRACTURE SURGERY Left 04/14/2013    Left wrist fracture.        Family History:  Family History   Problem Relation Age of Onset    Stroke Mother         Cerebrovascular accident.     Alzheimer's disease Mother     Heart disease Father     Stroke Father         Cerebrovascular accident.     Heart disease Brother     Other Brother         Heart valve replacement.     Colon cancer Paternal Grandmother         Malignant tumor of colon.        Social History:  Social History     Socioeconomic History    Marital status: Single   Tobacco Use    Smoking status: Former Smoker     Packs/day: 1.00     Years: 14.00     Pack years: 14.00     Quit date: 1983     Years since quitting: 38.9    Smokeless tobacco: Never Used    Tobacco comment: Most recent tobacco use screening: 09/30/2017.     Vaping Use    Vaping Use: Never used   Substance and Sexual Activity    Alcohol use: Yes     Comment: One drink or less per week.    Social History Narrative    Exercise: none, walking in the  house       The following sections were reviewed this encounter by the provider:        ROS:  Review of Systems    Vitals:  BP 120/68 (BP Site: Left arm, Patient Position: Sitting, Cuff Size: Medium)    Pulse 92    Temp (!) 96.5 F (35.8 C) (Tympanic)    Resp 12    Ht 1.676 m (5\' 6" )    Wt 81.2 kg (179 lb)    BMI 28.89 kg/m      Objective:     Physical Exam:  Physical Exam  Constitutional:       General: She is not in acute distress.  HENT:      Head: Atraumatic.   Cardiovascular:      Rate and Rhythm: Regular rhythm.      Heart sounds: No murmur heard.      Pulmonary:      Effort: Pulmonary effort is normal.      Breath sounds: Normal breath sounds. No wheezing.   Abdominal:      Tenderness: There is no abdominal tenderness.   Musculoskeletal:         General: Swelling (mild bilat knees) and tenderness (mild hypertrophy and tender) present.      Cervical back: Neck supple.   Neurological:      General: No focal deficit present.   Psychiatric:         Behavior: Behavior normal.          Assessment:     1. Prediabetes  - POCT Hemoglobin A1C    2. Stage 3a chronic kidney disease  - Comprehensive metabolic panel  -  Urine Microalbumin Random    3. Benign essential hypertension  - metoprolol tartrate (LOPRESSOR) 50 MG tablet; Take 1 tablet (50 mg total) by mouth daily  Dispense: 90 tablet; Refill: 1    4. Essential hypertension  - metoprolol tartrate (LOPRESSOR) 50 MG tablet; Take 1 tablet (50 mg total) by mouth daily  Dispense: 90 tablet; Refill: 1    5. Mixed hyperlipidemia  - rosuvastatin (CRESTOR) 10 MG tablet; Take 1 tablet (10 mg total) by mouth daily  Dispense: 90 tablet; Refill: 1  - Lipid panel    6. Osteopenia, unspecified location    7. Primary osteoarthritis of both knees  - Ambulatory referral to Physical Therapy    8. Gastroesophageal reflux disease without esophagitis  - omeprazole (PriLOSEC) 20 MG capsule; Take 1 capsule (20 mg total) by mouth daily  Dispense: 30 capsule; Refill: 5    9. Encounter for  hepatitis C virus screening test for high risk patient  - Hepatitis C (HCV) antibody, Total      Plan:     Discussed meds and lower omeprazole  Monitor bp at home too goal under 140/90 after resting for 10 min  Avoid nsaids    Continue current other medicines  Side effect of meds discussed  Do healthy , high fiber, low cholesterol, low carb diet  Exercise most days  Plan for wellness visit every year  Follow up visit with [name] in 6 months    Satira Mccallum, MD

## 2020-06-04 ENCOUNTER — Telehealth (INDEPENDENT_AMBULATORY_CARE_PROVIDER_SITE_OTHER): Payer: Self-pay | Admitting: Family Medicine

## 2020-06-04 ENCOUNTER — Other Ambulatory Visit (INDEPENDENT_AMBULATORY_CARE_PROVIDER_SITE_OTHER): Payer: Self-pay | Admitting: Family Medicine

## 2020-06-04 DIAGNOSIS — E039 Hypothyroidism, unspecified: Secondary | ICD-10-CM

## 2020-06-04 LAB — TSH: TSH: 1.7 u[IU]/mL (ref 0.35–4.94)

## 2020-06-04 MED ORDER — LEVOTHYROXINE SODIUM 75 MCG PO TABS
75.0000 ug | ORAL_TABLET | Freq: Every day | ORAL | 3 refills | Status: DC
Start: 2020-06-04 — End: 2021-08-26

## 2020-06-04 NOTE — Telephone Encounter (Signed)
Form filled out to have TSH added onto recent Blood work

## 2020-06-04 NOTE — Telephone Encounter (Signed)
Name of Requester: Self  Clinical Question: Patient is asking did we do blood work on her thyroid yesterday?  Patient is stating she only has 5 days left of Euthyrox   Last DOS: 06-03-2020  Requesters preferred ph: [phone]  Please note:[facility] [address]

## 2020-06-04 NOTE — Progress Notes (Signed)
Hello Ms.  Felten:    I have reviewed your results.   Normal tsh so sent med for one year    Take good care and practice healthy habits  Dr. Jonathon Bellows

## 2020-06-04 NOTE — Telephone Encounter (Signed)
I spoke with Christine Bridges to add tsh today.  Pls advise  pt We will wait for result and send rx soon Ty

## 2020-06-04 NOTE — Telephone Encounter (Signed)
Please advise if ok for refill or needing to get blood work done again?

## 2020-06-09 ENCOUNTER — Encounter (INDEPENDENT_AMBULATORY_CARE_PROVIDER_SITE_OTHER): Payer: Self-pay | Admitting: Family Medicine

## 2020-06-11 ENCOUNTER — Encounter (INDEPENDENT_AMBULATORY_CARE_PROVIDER_SITE_OTHER): Payer: Self-pay | Admitting: Family Medicine

## 2020-06-15 ENCOUNTER — Telehealth (INDEPENDENT_AMBULATORY_CARE_PROVIDER_SITE_OTHER): Payer: Self-pay | Admitting: Family Medicine

## 2020-06-15 NOTE — Telephone Encounter (Addendum)
Pt's daughter called on-call physician. Pt reports episode last night of feeling confused, difficulty putting words together, blurred vision. Unsure if having weakness or numbness. Resolved after 45 min or so. Had not been drinking many fluids yesterday. Initially attributed sx to dehydration, but now concerned that she may have had a "mini stroke." Today, appears to be at baseline. No previous h/o stroke or TIA, but strong family history. Advised to monitor today. If she continues to remain at baseline, ok for visit tomorrow. If she has another episode, should go to the ER. Recommend staring aspirin 81mg  daily.  Reviewed worrisome signs/symptoms, seek care if present  Reviewed ER precautions.  Pt's daughter voiced understanding.

## 2020-06-16 ENCOUNTER — Encounter (INDEPENDENT_AMBULATORY_CARE_PROVIDER_SITE_OTHER): Payer: Self-pay | Admitting: Family Medicine

## 2020-06-16 ENCOUNTER — Telehealth (INDEPENDENT_AMBULATORY_CARE_PROVIDER_SITE_OTHER): Payer: Medicare Other | Admitting: Family Medicine

## 2020-06-16 VITALS — BP 136/82 | HR 67 | Temp 96.0°F | Ht 64.0 in | Wt 174.0 lb

## 2020-06-16 DIAGNOSIS — G459 Transient cerebral ischemic attack, unspecified: Secondary | ICD-10-CM

## 2020-06-16 DIAGNOSIS — R410 Disorientation, unspecified: Secondary | ICD-10-CM

## 2020-06-16 DIAGNOSIS — I10 Essential (primary) hypertension: Secondary | ICD-10-CM

## 2020-06-16 DIAGNOSIS — R9439 Abnormal result of other cardiovascular function study: Secondary | ICD-10-CM

## 2020-06-16 DIAGNOSIS — E782 Mixed hyperlipidemia: Secondary | ICD-10-CM

## 2020-06-16 DIAGNOSIS — N1831 Chronic kidney disease, stage 3a: Secondary | ICD-10-CM

## 2020-06-16 NOTE — Telephone Encounter (Signed)
Contacted patient and she states symptoms lasted 5 minutes then dissipated.  She did not go to ER.  She states she feels much better and have VV with Dr Delane Ginger.

## 2020-06-16 NOTE — Progress Notes (Signed)
[name]                       Date of Virtual Visit: 06/16/2020 1:50 PM        Patient ID: Christine Bridges is a 70 y.o. female.  Attending Physician: Starleen Blue, MD       Telemedicine Eligibility:    State Location:  [x]  [place]  []  [place]  []  [place] []  [place]  []  Other:    Physical Location:  [x]  Home  []  Office  []  Other:    Patient Identity Verification:  [x]  State Issued ID  []  Insurance Eligibility Check  []  Other:    Physical Address and Phone Verification (for 911):  [x]  Yes  []  No    Personal identity shared with patient:  [x]  Yes  []  No    Education on nature of video visit shared with patient:  [x]  Yes  []  No    Emergency plan agreed upon with patient:  [x]  Yes  []  No    Visit terminated since not appropriate for virtual care:  [x]  N/A  []  Reason:    This visit is being conducted via video and telephone.  Verbal consent has been obtained from the patient to conduct a video and telephone visit to minimize exposure to COVID-19: yes.  Patient consents to bill insurance for this visit.       Chief Complaint:    Chief Complaint   Patient presents with    Dehydration    Confusion                 HPI:    70 year old female established patient presents today for telemedicine visit  Patient confirmed in state of [place] today  Patient provided verbal consent to bill insurance for today's visit.      Patient presents today to discuss concerns of dehydration and confusion. Patient's daughter 01/02/202, and spoke with Dr Cliffton Asters, on-call provider and the following was documented: "Pt's daughter called on-call physician. Pt reports episode last night of feeling confused, difficulty putting words together, blurred vision. Unsure if having weakness or numbness. Resolved after 45 min or so. Had not been drinking many fluids yesterday. Initially attributed sx to dehydration, but now concerned that she may have had a "mini stroke." Today, appears to be at  baseline. No previous h/o stroke or TIA, but strong family history. Advised to monitor today. If she continues to remain at baseline, ok for visit tomorrow. If she has another episode, should go to the ER. Recommend staring aspirin 81mg  daily.  Reviewed worrisome signs/symptoms, seek care if present  Reviewed ER precautions.  Pt's daughter voiced understanding."    Patient's daughter, Constance Holster,  states that today patient is experiencing a lingering headache. Patient states that she does feel okay but is experiencing some chest tightness, but it is chronic symptom. 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 PET MPI reveals small sized, moderate intensity, reversible defect, involving the inferoapex suggestive of ischemia. Summed difference score (SDS) is 10. EF is 67%    Notes from cardiologist-  On metoprolol tartrate 50 mg daily  Patient continues to have exertional chest discomfort.   We discussed that the stress test was abnormal demonstrating inferior apical ischemia.   We discussed option.  Patient will proceed with cardiac catheterization.  She will continue with metoprolol and start aspirin  81 mg daily.                  Problem List:    Patient Active Problem List   Diagnosis    GAD (generalized anxiety disorder)    Vitamin D deficiency    Fatigue, unspecified type    Mixed hyperlipidemia    Hypothyroidism    Benign essential hypertension    GERD (gastroesophageal reflux disease)    Hiatal hernia    Prediabetes    Stage 3a chronic kidney disease    On long term drug therapy    Allergic rhinitis    History of colonic polyps    Osteopenia    Palpitations    Polyp of colon    Rosacea    Overweight (BMI 25.0-29.9)    Primary osteoarthritis of both knees             Current Meds:    Outpatient Medications Marked as Taking for the 06/16/20 encounter (Telemedicine Visit) with Starleen Blue, MD   Medication Sig Dispense  Refill    BIOTIN PO Take by mouth      CALCIUM PO Take by mouth      cetirizine (ZyrTEC Allergy) 10 MG tablet every 24 hours      levothyroxine (Euthyrox) 75 MCG tablet Take 1 tablet (75 mcg total) by mouth daily 90 tablet 3    metoprolol tartrate (LOPRESSOR) 50 MG tablet Take 1 tablet (50 mg total) by mouth daily 90 tablet 1    Multiple Vitamins-Minerals (MULTIVITAMIN ADULTS 50+ PO) multivitamin      omeprazole (PriLOSEC) 20 MG capsule Take 1 capsule (20 mg total) by mouth daily 90 capsule 1    rosuvastatin (Crestor) 20 MG tablet Take 1 tablet (20 mg total) by mouth nightly 90 tablet 1    VITAMIN D PO Take by mouth            Allergies:    Allergies   Allergen Reactions    Metronidazole              Past Surgical History:    Past Surgical History:   Procedure Laterality Date    Back/Spine surgery  06/15/1983    COLONOSCOPY  07/06/2018    Repeat in 5 years.     COLONOSCOPY  04/14/2013    Normal.     ENDOSCOPY UPPER GI  2016    normal, done x2    FOOT SURGERY Left 05/14/2010    Left foot.     Oral/Dental surgery  12/12/2017    OTHER SURGICAL HISTORY  06/14/2016    heart work up, stress test negative.    WRIST FRACTURE SURGERY Left 04/14/2013    Left wrist fracture.            Family History:    Family History   Problem Relation Age of Onset    Stroke Mother         Cerebrovascular accident.     Alzheimer's disease Mother     Heart disease Father     Stroke Father         Cerebrovascular accident.     Heart disease Brother     Other Brother         Heart valve replacement.     Colon cancer Paternal Grandmother         Malignant tumor of colon.            Social History:    Social History  Tobacco Use    Smoking status: Former Smoker     Packs/day: 1.00     Years: 14.00     Pack years: 14.00     Quit date: 1983     Years since quitting: 39.0    Smokeless tobacco: Never Used    Tobacco comment: Most recent tobacco use screening: 09/30/2017.     Vaping Use    Vaping Use: Never used    Substance Use Topics    Alcohol use: Yes     Comment: One drink or less per week.     Drug use: Not on file          The following sections were reviewed this encounter by the provider:            Vital Signs:    BP 136/82    Pulse 67    Temp (!) 96 F (35.6 C)    Ht 1.626 m (5\' 4" )    Wt 78.9 kg (174 lb)    SpO2 96%    BMI 29.87 kg/m          ROS:    Review of Systems           Physical Exam:    Physical Exam   Physical exam limited due to being video visit  Physical Exam   GENERAL APPEARANCE: well developed, well nourished, no acute distress  HEAD: normal appearance, atraumatic, no swelling of face  EYES: no discharge, conjunctiva normal  EARS: normal hearing  NOSE: no nasal discharge  MOUTH: normal appearance,no perioral lesions  THROAT: normal voice  NECK/THYROID: no visual deformity or mass  LUNGS/CHEST: unlabored, speaking in full sentences, no audible stridor, no audible wheezing, no prolonged expiratory phase, normal respiratory rate  NEURO: facial movement symmetric, normal speech, normal head movement,  PSYCH: alert and oriented, appropriate affect, appropriate mood, normal speech        Assessment:    1. Benign essential hypertension    2. Stage 3a chronic kidney disease    3. Mixed hyperlipidemia    4. TIA (transient ischemic attack)  - US Carotid Duplex Dopp Comp Bilateral; Future    5. Confusion  - US Carotid Duplex Dopp Comp Bilateral; Future    6. Abnormal cardiovascular stress test            Plan:    Counseled on medication.  Counseled on symptomatic treatment. Follow up if symptoms persist or worsen or new arise.   Continue aspirin  Beta blocker  Keep follow up with cardiologist  Cardiologist notes reviewed   Labs reviewed  Continue 20 mg crestor  Heart healthy diet discussed  Limit fat and starches  Carotid doppler ordered  Very likely TIA based on symptoms and risk factors  If symptoms recur or has weakness, confusion advised to see emergency care          Follow-up:    No follow-ups on  file.         Starleen Blue, MD

## 2020-06-23 ENCOUNTER — Telehealth (INDEPENDENT_AMBULATORY_CARE_PROVIDER_SITE_OTHER): Payer: Self-pay | Admitting: Family Medicine

## 2020-06-23 DIAGNOSIS — K219 Gastro-esophageal reflux disease without esophagitis: Secondary | ICD-10-CM

## 2020-06-23 MED ORDER — FAMOTIDINE 20 MG PO TABS
20.0000 mg | ORAL_TABLET | Freq: Two times a day (BID) | ORAL | 5 refills | Status: DC
Start: 2020-06-23 — End: 2020-11-18

## 2020-06-23 NOTE — Telephone Encounter (Signed)
Dr Jonathon Bellows  She will require a substitute for the omeprazole.  She was started on plavix y cardio after stents were placed.  She is follow up with cardio on 06/26/20 and then follow up scheduled with you on 07/07/20.  She wanted to know if there is a medication to sub while waiting to see you on 07/07/20  Thanks  [employer]

## 2020-06-23 NOTE — Telephone Encounter (Signed)
I have e-sent famotidine to try . pls advise TY

## 2020-06-23 NOTE — Telephone Encounter (Signed)
Name of Requester: Patient  Clinical Question: Was put on medication ( Clopidgrel) after having stent placed. Per pharmacy medication interacts with ( Omeprazole) would like a call back to discuss.   Last DOS: 06/16/2020  Requesters preferred ph: [phone]  Please note:

## 2020-06-25 ENCOUNTER — Encounter (INDEPENDENT_AMBULATORY_CARE_PROVIDER_SITE_OTHER): Payer: Self-pay | Admitting: Family Medicine

## 2020-07-07 ENCOUNTER — Telehealth (INDEPENDENT_AMBULATORY_CARE_PROVIDER_SITE_OTHER): Payer: Medicare Other | Admitting: Family Medicine

## 2020-07-07 ENCOUNTER — Encounter (INDEPENDENT_AMBULATORY_CARE_PROVIDER_SITE_OTHER): Payer: Self-pay | Admitting: Family Medicine

## 2020-07-07 VITALS — BP 125/72 | HR 78

## 2020-07-07 DIAGNOSIS — I25119 Atherosclerotic heart disease of native coronary artery with unspecified angina pectoris: Secondary | ICD-10-CM | POA: Insufficient documentation

## 2020-07-07 DIAGNOSIS — K219 Gastro-esophageal reflux disease without esophagitis: Secondary | ICD-10-CM

## 2020-07-07 DIAGNOSIS — E782 Mixed hyperlipidemia: Secondary | ICD-10-CM

## 2020-07-07 DIAGNOSIS — I10 Essential (primary) hypertension: Secondary | ICD-10-CM

## 2020-07-07 DIAGNOSIS — I2510 Atherosclerotic heart disease of native coronary artery without angina pectoris: Secondary | ICD-10-CM

## 2020-07-07 DIAGNOSIS — N1831 Chronic kidney disease, stage 3a: Secondary | ICD-10-CM

## 2020-07-07 NOTE — Progress Notes (Signed)
[facility] [place]                       Date of Virtual Visit: 07/07/2020 2:27 PM        Patient ID: Christine Bridges is a 70 y.o. female.  Attending Physician: Satira Mccallum, MD       Telemedicine Eligibility:    State Location:  [x]  [place]  []  [place]  []  [place] []  [place]  []  Other:    Physical Location:  [x]  Home  []         []        []          []  Other:    Patient Identity Verification:  [x]  State Issued ID  []  Insurance Eligibility Check  []  Other:    Physical Address Verification: (for 911)  [x]  Yes  []  No    Personal identity shared with patient:  [x]  Yes  []  No    Education on nature of video visit shared with patient:  [x]  Yes  []  No    Emergency plan agreed upon with patient:  [x]  Yes  []  No    If the patient had not had this virtual visit, what would they have done?  []         []         []        []          []  Other:    Visit terminated since not appropriate for virtual care:  [x]  N/A  []  Reason:         Chief Complaint:    Chief Complaint   Patient presents with    Hypertension               HPI:    CONSENT GIVEN TO BILL INSURANCE FOR VIDEO VISIT    1.She had cardiac stent placed 2weeks ago and has another stent to be placed 07/16/20  She will update PCP on cardiac status  She is not checking BP regularly  She is taking medication as prescribed    2. Reflux: bad, pepcid is not controlling it enough. Given pantoprazole by cardiologist    3. Lipids: taking rosuvastatin 20 mg daily now    8 am up  Water   Coffee with sugar    brkfast 9:15 am eggs bacon , no bread  Oatmeal, canadian bacon    Snack: no  Lunch 12:30 pm salad and avocado    Snack: candy, nuts peanuts, cashews    Dinner 6:30 pm cauliflower, mashed potatoes, steak, fried    Christine Bridges is eating high fat    Confused about diet              Problem List:    Patient Active Problem List   Diagnosis    GAD (generalized anxiety disorder)    Vitamin D deficiency    Fatigue, unspecified type     Mixed hyperlipidemia    Hypothyroidism    Benign essential hypertension    GERD (gastroesophageal reflux disease)    Hiatal hernia    Prediabetes    Stage 3a chronic kidney disease    On long term drug therapy    Allergic rhinitis    History of colonic polyps    Osteopenia    Palpitations    Polyp of colon    Rosacea    Overweight (BMI 25.0-29.9)    Primary osteoarthritis of both knees  Coronary artery disease involving native coronary artery of native heart without angina pectoris             Current Meds:    Outpatient Medications Marked as Taking for the 07/07/20 encounter (Telemedicine Visit) with Satira Mccallum, MD   Medication Sig Dispense Refill    aspirin 81 MG EC tablet Take 81 mg by mouth daily      BIOTIN PO Take by mouth      CALCIUM PO Take by mouth      cetirizine (ZyrTEC Allergy) 10 MG tablet every 24 hours      clopidogrel (PLAVIX) 75 mg tablet Take 75 mg by mouth      famotidine (PEPCID) 20 MG tablet Take 1 tablet (20 mg total) by mouth 2 (two) times daily 60 tablet 5    levothyroxine (Euthyrox) 75 MCG tablet Take 1 tablet (75 mcg total) by mouth daily 90 tablet 3    metoprolol tartrate (LOPRESSOR) 50 MG tablet Take 1 tablet (50 mg total) by mouth daily 90 tablet 1    Multiple Vitamins-Minerals (MULTIVITAMIN ADULTS 50+ PO) multivitamin      pantoprazole (PROTONIX) 40 MG tablet Take 40 mg by mouth      rosuvastatin (Crestor) 20 MG tablet Take 1 tablet (20 mg total) by mouth nightly 90 tablet 1    vitamin D3 (CHOLECALCIFEROL) 10 MCG (400 UNIT) tablet Take 2,000 Units by mouth daily            Allergies:    Allergies   Allergen Reactions    Metronidazole              Past Surgical History:    Past Surgical History:   Procedure Laterality Date    Back/Spine surgery  06/15/1983    COLONOSCOPY  07/06/2018    Repeat in 5 years.     COLONOSCOPY  04/14/2013    Normal.     ENDOSCOPY UPPER GI  2016    normal, done x2    FOOT SURGERY Left 05/14/2010    Left foot.     Oral/Dental  surgery  12/12/2017    OTHER SURGICAL HISTORY  06/14/2016    heart work up, stress test negative.    WRIST FRACTURE SURGERY Left 04/14/2013    Left wrist fracture.            Family History:    Family History   Problem Relation Age of Onset    Stroke Mother         Cerebrovascular accident.     Alzheimer's disease Mother     Heart disease Father     Stroke Father         Cerebrovascular accident.     Heart disease Brother     Other Brother         Heart valve replacement.     Colon cancer Paternal Grandmother         Malignant tumor of colon.            Social History:    Social History     Socioeconomic History    Marital status: Single   Tobacco Use    Smoking status: Former Smoker     Packs/day: 1.00     Years: 14.00     Pack years: 14.00     Quit date: 1983     Years since quitting: 39.0    Smokeless tobacco: Never Used    Tobacco comment: Most recent tobacco use screening: 09/30/2017.  Vaping Use    Vaping Use: Never used   Substance and Sexual Activity    Alcohol use: Yes     Comment: One drink or less per week.    Social History Narrative    Exercise: none, walking in the house                  Vital Signs:    BP 125/72    Pulse 78          ROS:    Review of Systems           Physical Exam:    Physical Exam   GENERAL APPEARANCE: alert, in no acute distress, pleasant, well nourished.   HEAD: normal appearance  EYES: no discharge  EARS: normal hearing  NECK/THYROID: appearance -supple  PSYCH: appropriate affect, appropriate mood, normal speech, normal attention        Assessment:    1. Coronary artery disease involving native coronary artery of native heart without angina pectoris    2. Stage 3a chronic kidney disease    3. Gastroesophageal reflux disease without esophagitis    4. Mixed hyperlipidemia    5. Benign essential hypertension            Plan:    Can try pantoprazole along with pepcid. Once reflux well controlled then try to cut down on pepcid to one a day for one week and  stop  Discussed PPI s.e and tried to stop but other meds not controlling reflux well  Continue currentother  medicines  Side effect of meds discussed  Do healthy , high fiber, low cholesterol, low carb diet  Exercise most days  Plan for wellness visit every year  Follow up visit with [name] in 2 months  Discussed low cholesterol, low-moderate healthy fat, low carb diet  Avoid red meats and egg yolks  Will use egg beaters    I discussed low carb diet in detail to avoid diabetes, heart disease and other obesity related diseases. It helps lose weight, cut down on inflammation and improve energy. I reviewed the allowed food list and foods to avoid or eat less and provided the list.  I advised to aim for 20-30 grams of protein 3xday with 5-10 grams of protein snacks in between. Goal is to provide body with adequate protein to give adequate nutrition and to feel full so that for the extra calories the body needs, it cuts down on its own fat.  I encourage one lb/week weight loss. Check weight at least once a week.  Exercise most days at least 30 minutes.                The following activities were performed on the date of service:  preparing to see the patient: - chart review   - review of prior labs  - review of consultant reports  obtaining and/or reviewing the separately obtained history  performing a medically appropriate examination and/or evaluation   counseling and educating the patient/family/caregiver    Total time spent performing activities on date of service:  45  minutes              Follow-up:    Return in about 2 months (around 09/04/2020) for chronic care follow up, visit, fasting.         Satira Mccallum, MD

## 2020-07-08 ENCOUNTER — Encounter (INDEPENDENT_AMBULATORY_CARE_PROVIDER_SITE_OTHER): Payer: Self-pay | Admitting: Family Medicine

## 2020-07-11 ENCOUNTER — Telehealth (INDEPENDENT_AMBULATORY_CARE_PROVIDER_SITE_OTHER): Payer: Self-pay | Admitting: Family Medicine

## 2020-07-11 NOTE — Telephone Encounter (Signed)
Patient called on 07/11/20 at 415 pm stating that she accidentally took metoprolol and plavix twice today.  She states that her current BP is 126/77.  She states she does not feel abnormal, no chest pain, no SOB, no dizziness. She states she has checked BP several times since taking extra medication and it has been normal.  Nurse advised patient to check BP several more times today.  Nurse advise to monitor for unusual bruising, blood in urine, blood in stool, dizziness.  Also to drink water and flush system out.  Patient verbalized understanding and will contact on call if needed

## 2020-07-16 ENCOUNTER — Encounter (INDEPENDENT_AMBULATORY_CARE_PROVIDER_SITE_OTHER): Payer: Self-pay | Admitting: Family Medicine

## 2020-07-23 ENCOUNTER — Encounter (INDEPENDENT_AMBULATORY_CARE_PROVIDER_SITE_OTHER): Payer: Self-pay | Admitting: Family Medicine

## 2020-08-04 ENCOUNTER — Encounter (INDEPENDENT_AMBULATORY_CARE_PROVIDER_SITE_OTHER): Payer: Self-pay | Admitting: Family Medicine

## 2020-08-04 NOTE — Progress Notes (Signed)
Pls advise Dr. Jonathon Bellows.

## 2020-10-30 ENCOUNTER — Telehealth (INDEPENDENT_AMBULATORY_CARE_PROVIDER_SITE_OTHER): Payer: Medicare Other | Admitting: Family Medicine

## 2020-10-30 ENCOUNTER — Encounter (INDEPENDENT_AMBULATORY_CARE_PROVIDER_SITE_OTHER): Payer: Self-pay | Admitting: Family Medicine

## 2020-10-30 DIAGNOSIS — R058 Other specified cough: Secondary | ICD-10-CM

## 2020-10-30 MED ORDER — PREDNISONE 20 MG PO TABS
ORAL_TABLET | ORAL | 0 refills | Status: DC
Start: 2020-10-30 — End: 2020-11-18

## 2020-10-30 NOTE — Progress Notes (Signed)
[name] [place]                       Date of Virtual Visit: 10/30/2020 2:18 PM      Verbal consent has been obtained from the patient to conduct this video visit encounter to minimize exposure to COVID-19. Patient was positively identified and was verified to be located in the state of [place] at the time of the visit       Patient ID: Christine Bridges is a 70 y.o. female.  Attending Physician: Leonides Cave, MD       Telemedicine Eligibility:    State Location:  [x]  [place]  []  [place]  []  [place] []  [place]  []  Other:    Physical Location:  [x]  Home  []         []        []          []  Other:    Patient Identity Verification:  [x]  State Issued ID  []  Insurance Eligibility Check  []  Other:    Physical Address Verification: (for 911)  [x]  Yes  []  No    Personal identity shared with patient:  [x]  Yes  []  No    Education on nature of video visit shared with patient:  [x]  Yes  []  No    Emergency plan agreed upon with patient:  [x]  Yes  []  No    If the patient had not had this virtual visit, what would they have done?  []         []         []        []          []  Other:    Visit terminated since not appropriate for virtual care:  [x]  N/A  []  Reason:         Chief Complaint:    Chief Complaint   Patient presents with    URI               HPI:    70 y.o female patient presents via virtual visit for URI sxs.  She had covid testing done at least 3x and all came back negative.    URI   This is a new problem. The current episode started in the past 7 days. The problem has been waxing and waning. There has been no fever. Associated symptoms include congestion, coughing, rhinorrhea, sinus pain and a sore throat. Associated symptoms comments: fatigue. She has tried decongestant (alka seltzer cold) for the symptoms. The treatment provided no relief.     *Permission obtained to bill insurance for today's VV*          Problem List:    Patient Active Problem List    Diagnosis    GAD (generalized anxiety disorder)    Vitamin D deficiency    Fatigue, unspecified type    Mixed hyperlipidemia    Hypothyroidism    Benign essential hypertension    GERD (gastroesophageal reflux disease)    Hiatal hernia    Prediabetes    Stage 3a chronic kidney disease    On long term drug therapy    Allergic rhinitis    History of colonic polyps    Osteopenia    Palpitations    Polyp of colon    Rosacea    Overweight (BMI 25.0-29.9)    Primary osteoarthritis of both knees    Coronary artery disease involving native coronary artery of  native heart without angina pectoris             Current Meds:    Outpatient Medications Marked as Taking for the 10/30/20 encounter (Telemedicine Visit) with Leonides Cave, MD   Medication Sig Dispense Refill    aspirin 81 MG EC tablet Take 81 mg by mouth daily      BIOTIN PO Take by mouth      CALCIUM PO Take by mouth      cetirizine (ZyrTEC) 10 MG tablet every 24 hours      clopidogrel (PLAVIX) 75 mg tablet Take 75 mg by mouth      famotidine (PEPCID) 20 MG tablet Take 1 tablet (20 mg total) by mouth 2 (two) times daily 60 tablet 5    levothyroxine (Euthyrox) 75 MCG tablet Take 1 tablet (75 mcg total) by mouth daily 90 tablet 3    metoprolol tartrate (LOPRESSOR) 50 MG tablet Take 1 tablet (50 mg total) by mouth daily 90 tablet 1    Multiple Vitamins-Minerals (MULTIVITAMIN ADULTS 50+ PO) multivitamin      pantoprazole (PROTONIX) 40 MG tablet Take 40 mg by mouth      rosuvastatin (Crestor) 20 MG tablet Take 1 tablet (20 mg total) by mouth nightly 90 tablet 1    vitamin D3 (CHOLECALCIFEROL) 10 MCG (400 UNIT) tablet Take 2,000 Units by mouth daily            Allergies:    Allergies   Allergen Reactions    Metronidazole              Past Surgical History:    Past Surgical History:   Procedure Laterality Date    Back/Spine surgery  06/15/1983    COLONOSCOPY  07/06/2018    Repeat in 5 years.     COLONOSCOPY  04/14/2013    Normal.      ENDOSCOPY UPPER GI  2016    normal, done x2    FOOT SURGERY Left 05/14/2010    Left foot.     Oral/Dental surgery  12/12/2017    OTHER SURGICAL HISTORY  06/14/2016    heart work up, stress test negative.    WRIST FRACTURE SURGERY Left 04/14/2013    Left wrist fracture.            Family History:    Family History   Problem Relation Age of Onset    Stroke Mother         Cerebrovascular accident.     Alzheimer's disease Mother     Heart disease Father     Stroke Father         Cerebrovascular accident.     Heart disease Brother     Other Brother         Heart valve replacement.     Colon cancer Paternal Grandmother         Malignant tumor of colon.            Social History:    Social History     Tobacco Use    Smoking status: Former Smoker     Packs/day: 1.00     Years: 14.00     Pack years: 14.00     Quit date: 1983     Years since quitting: 39.4    Smokeless tobacco: Never Used    Tobacco comment: Most recent tobacco use screening: 09/30/2017.     Vaping Use    Vaping Use: Never used   Substance Use Topics  Alcohol use: Yes     Comment: One drink or less per week.            The following sections were reviewed this encounter by the provider:   Tobacco   Allergies   Meds   Problems   Med Hx   Surg Hx   Fam Hx                Vital Signs:    There were no vitals taken for this visit.         ROS:    Review of Systems   HENT: Positive for congestion, rhinorrhea, sinus pain and sore throat.    Respiratory: Positive for cough.               Physical Exam:    Physical Exam  Constitutional:       Appearance: She is not ill-appearing.   Neurological:      Mental Status: She is alert.                Assessment:    1. Post-viral cough syndrome  - predniSONE (DELTASONE) 20 MG tablet; Take 3 tabs daily x 3d, 2 tabs daily x 3d, 1 tab daily x 3d, 1/2 tab daily x 3d, then off  Dispense: 21 tablet; Refill: 0    Risk & Benefits of the new medication(s) were explained to the patient (and family) who verbalized  understanding & agreed to the treatment plan. Patient (family) encouraged to contact me/clinical staff with any questions/concerns    The following activities were performed on the date of service:  obtaining and/or reviewing the separately obtained history  performing a medically appropriate examination and/or evaluation   counseling and educating the patient/family/caregiver  ordering medications, tests, or procedures                  Plan:                Follow-up:    Return if symptoms worsen or fail to improve.         Leonides Cave, MD       15 minute

## 2020-11-18 ENCOUNTER — Encounter (INDEPENDENT_AMBULATORY_CARE_PROVIDER_SITE_OTHER): Payer: Self-pay | Admitting: Family Medicine

## 2020-11-18 ENCOUNTER — Telehealth (INDEPENDENT_AMBULATORY_CARE_PROVIDER_SITE_OTHER): Payer: Self-pay | Admitting: Family Medicine

## 2020-11-18 ENCOUNTER — Ambulatory Visit (INDEPENDENT_AMBULATORY_CARE_PROVIDER_SITE_OTHER): Payer: Medicare Other | Admitting: Family Medicine

## 2020-11-18 VITALS — BP 134/80 | HR 70 | Temp 97.0°F | Resp 18 | Ht 64.0 in | Wt 162.0 lb

## 2020-11-18 DIAGNOSIS — Z1231 Encounter for screening mammogram for malignant neoplasm of breast: Secondary | ICD-10-CM

## 2020-11-18 DIAGNOSIS — Z0001 Encounter for general adult medical examination with abnormal findings: Secondary | ICD-10-CM

## 2020-11-18 DIAGNOSIS — K141 Geographic tongue: Secondary | ICD-10-CM

## 2020-11-18 DIAGNOSIS — E782 Mixed hyperlipidemia: Secondary | ICD-10-CM

## 2020-11-18 DIAGNOSIS — R5383 Other fatigue: Secondary | ICD-10-CM

## 2020-11-18 DIAGNOSIS — R7303 Prediabetes: Secondary | ICD-10-CM

## 2020-11-18 DIAGNOSIS — K219 Gastro-esophageal reflux disease without esophagitis: Secondary | ICD-10-CM

## 2020-11-18 DIAGNOSIS — N1831 Chronic kidney disease, stage 3a: Secondary | ICD-10-CM

## 2020-11-18 DIAGNOSIS — E559 Vitamin D deficiency, unspecified: Secondary | ICD-10-CM

## 2020-11-18 DIAGNOSIS — E039 Hypothyroidism, unspecified: Secondary | ICD-10-CM

## 2020-11-18 DIAGNOSIS — I2510 Atherosclerotic heart disease of native coronary artery without angina pectoris: Secondary | ICD-10-CM

## 2020-11-18 LAB — CBC AND DIFFERENTIAL
Absolute NRBC: 0 10*3/uL (ref 0.00–0.00)
Basophils Absolute Automated: 0.05 10*3/uL (ref 0.00–0.08)
Basophils Automated: 0.7 %
Eosinophils Absolute Automated: 0.27 10*3/uL (ref 0.00–0.44)
Eosinophils Automated: 3.6 %
Hematocrit: 25.2 % — ABNORMAL LOW (ref 34.7–43.7)
Hgb: 7.4 g/dL — ABNORMAL LOW (ref 11.4–14.8)
Immature Granulocytes Absolute: 0.01 10*3/uL (ref 0.00–0.07)
Immature Granulocytes: 0.1 %
Lymphocytes Absolute Automated: 1.67 10*3/uL (ref 0.42–3.22)
Lymphocytes Automated: 22.1 %
MCH: 20.8 pg — ABNORMAL LOW (ref 25.1–33.5)
MCHC: 29.4 g/dL — ABNORMAL LOW (ref 31.5–35.8)
MCV: 70.8 fL — ABNORMAL LOW (ref 78.0–96.0)
MPV: 10 fL (ref 8.9–12.5)
Monocytes Absolute Automated: 0.64 10*3/uL (ref 0.21–0.85)
Monocytes: 8.5 %
Neutrophils Absolute: 4.9 10*3/uL (ref 1.10–6.33)
Neutrophils: 65 %
Nucleated RBC: 0 /100 WBC (ref 0.0–0.0)
Platelets: 394 10*3/uL — ABNORMAL HIGH (ref 142–346)
RBC: 3.56 10*6/uL — ABNORMAL LOW (ref 3.90–5.10)
RDW: 17 % — ABNORMAL HIGH (ref 11–15)
WBC: 7.54 10*3/uL (ref 3.10–9.50)

## 2020-11-18 LAB — HEMOLYSIS INDEX: Hemolysis Index: 1 (ref 0–24)

## 2020-11-18 LAB — LIPID PANEL
Cholesterol / HDL Ratio: 2.2
Cholesterol: 147 mg/dL (ref 0–199)
HDL: 67 mg/dL (ref 40–9999)
LDL Calculated: 66 mg/dL (ref 0–99)
Triglycerides: 69 mg/dL (ref 34–149)
VLDL Calculated: 14 mg/dL (ref 10–40)

## 2020-11-18 LAB — COMPREHENSIVE METABOLIC PANEL
ALT: 34 U/L (ref 0–55)
AST (SGOT): 39 U/L — ABNORMAL HIGH (ref 5–34)
Albumin/Globulin Ratio: 1.4 (ref 0.9–2.2)
Albumin: 3.8 g/dL (ref 3.5–5.0)
Alkaline Phosphatase: 70 U/L (ref 37–117)
Anion Gap: 6 (ref 5.0–15.0)
BUN: 13 mg/dL (ref 7.0–19.0)
Bilirubin, Total: 0.9 mg/dL (ref 0.2–1.2)
CO2: 26 mEq/L (ref 21–29)
Calcium: 9.3 mg/dL (ref 7.9–10.2)
Chloride: 106 mEq/L (ref 100–111)
Creatinine: 1 mg/dL (ref 0.4–1.5)
Globulin: 2.8 g/dL (ref 2.0–3.6)
Glucose: 106 mg/dL — ABNORMAL HIGH (ref 70–100)
Potassium: 5.2 mEq/L — ABNORMAL HIGH (ref 3.5–5.1)
Protein, Total: 6.6 g/dL (ref 6.0–8.3)
Sodium: 138 mEq/L (ref 136–145)

## 2020-11-18 LAB — VITAMIN D,25 OH,TOTAL: Vitamin D, 25 OH, Total: 56 ng/mL (ref 30–100)

## 2020-11-18 LAB — HEMOGLOBIN A1C
Average Estimated Glucose: 128.4 mg/dL
Hemoglobin A1C: 6.1 % — ABNORMAL HIGH (ref 4.6–5.9)

## 2020-11-18 LAB — GFR: EGFR: 54.6

## 2020-11-18 MED ORDER — TRIAMCINOLONE ACETONIDE 0.1 % MT PSTE
PASTE | OROMUCOSAL | 2 refills | Status: DC
Start: 2020-11-18 — End: 2020-12-19

## 2020-11-18 NOTE — Progress Notes (Signed)
Christine Bridges is a 70 y.o. female who presents today for a Medicare Annual Wellness Visit.     Health Risk Assessment     During the past month, how would you rate your general health?:  Poor  Which of the following tasks can you do without assistance - drive or take the bus alone; shop for groceries or clothes; prepare your own meals; do your own housework/laundry; handle your own finances/pay bills; eat, bathe or get around your home?:  Prepare your own meals, Eat, bathe, dress or get around your home, Drive or take the bus alone, Do your own housework/laundry, Handle your own finances/pay bills, Shop for groceries or clothes  Which of the following problems have you been bothered by in the past month - dizzy when standing up; problems using the phone; feeling tired or fatigued; moderate or severe body pain?: Feeling tired or fatigued  Do you exercise for about 20 minutes 3 or more days per week?:  No  During the past month was someone available to help if you needed and wanted help?  For example, if you felt nervous, lonely, got sick and had to stay in bed, needed someone to talk to, needed help with daily chores or needed help just taking care of yourself.: Yes  Do you always wear a seat belt?: Yes  Do you have any trouble taking medications the way you have been told to take them?: Yes  Have you been given any information that can help you with keeping track of your medications?: No  Do you have trouble paying for your medications?: No  Have you been given any information that can help you with hazards in your house, such as scatter rugs, furniture, etc?: Yes  Do you feel unsteady when standing or walking?: No  Do you worry about falling?: No  Have you fallen two or more times in the past year?: No  Did you suffer any injuries from your falls in the past year?: No    Additional Concerns    Patient Care Team:  Satira Mccallum, MD as PCP - General (Family Medicine)    Past Medical History:   Diagnosis Date     Abnormal vision     Has had an annual eye exam in 2016 and wears glasses which are new.     Chest tightness 02/21/2013    Osteoporosis     Rectal bleeding 02/21/2013     Past Surgical History:   Procedure Laterality Date    Back/Spine surgery  06/15/1983    COLONOSCOPY  07/06/2018    Repeat in 5 years.     COLONOSCOPY  04/14/2013    Normal.     ENDOSCOPY UPPER GI  2016    normal, done x2    FOOT SURGERY Left 05/14/2010    Left foot.     Oral/Dental surgery  12/12/2017    OTHER SURGICAL HISTORY  06/14/2016    heart work up, stress test negative.    WRIST FRACTURE SURGERY Left 04/14/2013    Left wrist fracture.      Allergies   Allergen Reactions    Metronidazole       Current Outpatient Medications   Medication Sig Dispense Refill    aspirin 81 MG EC tablet Take 81 mg by mouth daily      BIOTIN PO Take by mouth      CALCIUM PO Take by mouth      cetirizine (ZyrTEC) 10 MG tablet every 24 hours  clopidogrel (PLAVIX) 75 mg tablet Take 75 mg by mouth      famotidine (PEPCID) 40 MG tablet Take 40 mg by mouth 2 (two) times daily      levothyroxine (Euthyrox) 75 MCG tablet Take 1 tablet (75 mcg total) by mouth daily 90 tablet 3    metoprolol tartrate (LOPRESSOR) 25 MG tablet 25 mg 2 (two) times daily      Multiple Vitamins-Minerals (MULTIVITAMIN ADULTS 50+ PO) multivitamin      pantoprazole (PROTONIX) 40 MG tablet Take 40 mg by mouth      rosuvastatin (Crestor) 20 MG tablet Take 1 tablet (20 mg total) by mouth nightly 90 tablet 1    vitamin D3 (CHOLECALCIFEROL) 10 MCG (400 UNIT) tablet Take 2,000 Units by mouth daily      metoprolol tartrate (LOPRESSOR) 50 MG tablet Take 1 tablet (50 mg total) by mouth daily 90 tablet 1     No current facility-administered medications for this visit.      Social History     Tobacco Use    Smoking status: Former Smoker     Packs/day: 1.00     Years: 14.00     Pack years: 14.00     Quit date: 1983     Years since quitting: 39.4    Smokeless tobacco: Never Used     Tobacco comment: Most recent tobacco use screening: 09/30/2017.     Vaping Use    Vaping Use: Never used   Substance Use Topics    Alcohol use: Yes     Comment: One drink or less per week.       Family History   Problem Relation Age of Onset    Stroke Mother         Cerebrovascular accident.     Alzheimer's disease Mother     Heart disease Father     Stroke Father         Cerebrovascular accident.     Heart disease Brother     Other Brother         Heart valve replacement.     Colon cancer Paternal Grandmother         Malignant tumor of colon.          The following sections were reviewed this encounter by the provider:              Depression Screening    See related Activity or Flowsheet    Functional Ability    Falls Risk:  home does not have throw rugs, poor lighting or a slippery bath tub or shower  Hearing:  hearing within normal limits  Exercise:  Light ( i.e. stretching or slow walking )  ADL's:   Bathing - independent   Dressing - independent   Mobility - independent   Transfer - independent   Eating - independent}   Toileting - independent   ADL assistance not needed    Discussion of Advance Directives: Has an Advanced Directive. A copy has not been provided. Requested to provide.     Assessment    Temp 97 F (36.1 C) (Tympanic)    Resp 18    Ht 1.626 m (5\' 4" )    Wt 73.5 kg (162 lb)    BMI 27.81 kg/m      Vision Screening (required for IPPE only): Patient states eye exam performed elsewhere within past 12 months  Screening EKG (IPPE only): not indicated        Evaluation of  Cognitive Function    Mood/affect: Appropriate  Appearance: neatly groomed, appropriately and adequately nourished  Family member/caregiver input: No concerns        AWV Mini-Cog Result:  3 recalled words - negative screen for dementia    Assessment/Plan    There are no diagnoses linked to this encounter.    High BMI Follow Up   Encouragement to Exercise    [facility], LPN  06/19/1094

## 2020-11-18 NOTE — Telephone Encounter (Signed)
Name of Requester: Patient  Clinical Question: Patient states per conversation with provider, will send medication for sore tongue to local pharmacy on file.   Last DOS: 11/18/20  Requesters preferred ph: [phone]  Please note:

## 2020-11-18 NOTE — Progress Notes (Signed)
Subjective:      Patient ID: Christine Bridges is a 70 y.o. female.    Chief Complaint:  Chief Complaint   Patient presents with    Medicare Annual Wellness Visit - Subsequent       HPI:  HPI     1. ZOX:WRUE see cardiologist soon    2. Wt: was 184lbs last year, brought down  meditarean diet      3. Gerd: saw Dr. Laqueta Due  Barium swallow done  Still bothers, get chest burning    4. Knee pain: saw ortho , got injection did not help, had gel given better    5 pain in tongue going on for a few weeks    6. Cad: seeing cardiologist, on rehab    7. Lipids: came fasting today    8. Htn: stable on med  Problem List:  Patient Active Problem List   Diagnosis    GAD (generalized anxiety disorder)    Vitamin D deficiency    Fatigue, unspecified type    Mixed hyperlipidemia    Hypothyroidism    Benign essential hypertension    GERD (gastroesophageal reflux disease)    Hiatal hernia    Prediabetes    Stage 3a chronic kidney disease    On long term drug therapy    Allergic rhinitis    History of colonic polyps    Osteopenia    Palpitations    Polyp of colon    Rosacea    Overweight (BMI 25.0-29.9)    Primary osteoarthritis of both knees    Coronary artery disease involving native coronary artery of native heart without angina pectoris       Current Medications:  Current Outpatient Medications   Medication Sig Dispense Refill    aspirin 81 MG EC tablet Take 81 mg by mouth daily      BIOTIN PO Take by mouth      CALCIUM PO Take by mouth      cetirizine (ZyrTEC) 10 MG tablet every 24 hours      clopidogrel (PLAVIX) 75 mg tablet Take 75 mg by mouth      famotidine (PEPCID) 40 MG tablet Take 40 mg by mouth 2 (two) times daily      levothyroxine (Euthyrox) 75 MCG tablet Take 1 tablet (75 mcg total) by mouth daily 90 tablet 3    metoprolol tartrate (LOPRESSOR) 25 MG tablet 25 mg 2 (two) times daily      Multiple Vitamins-Minerals (MULTIVITAMIN ADULTS 50+ PO) multivitamin      pantoprazole (PROTONIX) 40 MG  tablet Take 40 mg by mouth      rosuvastatin (Crestor) 20 MG tablet Take 1 tablet (20 mg total) by mouth nightly 90 tablet 1    vitamin D3 (CHOLECALCIFEROL) 10 MCG (400 UNIT) tablet Take 2,000 Units by mouth daily      metoprolol tartrate (LOPRESSOR) 50 MG tablet Take 1 tablet (50 mg total) by mouth daily 90 tablet 1    triamcinolone (KENALOG) 0.1 % paste Apply to affected tongue 3xday for max 14 days and off for 7 days and prn 10 g 2     No current facility-administered medications for this visit.       Allergies:  Allergies   Allergen Reactions    Metronidazole        Past Medical History:  Past Medical History:   Diagnosis Date    Abnormal vision     Has had an annual eye exam in 2016 and wears  glasses which are new.     Chest tightness 02/21/2013    Osteoporosis     Rectal bleeding 02/21/2013       Past Surgical History:  Past Surgical History:   Procedure Laterality Date    Back/Spine surgery  06/15/1983    COLONOSCOPY  07/06/2018    Repeat in 5 years.     COLONOSCOPY  04/14/2013    Normal.     ENDOSCOPY UPPER GI  2016    normal, done x2    FOOT SURGERY Left 05/14/2010    Left foot.     Oral/Dental surgery  12/12/2017    OTHER SURGICAL HISTORY  06/14/2016    heart work up, stress test negative.    WRIST FRACTURE SURGERY Left 04/14/2013    Left wrist fracture.        Family History:  Family History   Problem Relation Age of Onset    Stroke Mother         Cerebrovascular accident.     Alzheimer's disease Mother     Heart disease Father     Stroke Father         Cerebrovascular accident.     Heart disease Brother     Other Brother         Heart valve replacement.     Colon cancer Paternal Grandmother         Malignant tumor of colon.        Social History:  Social History     Socioeconomic History    Marital status: Single   Tobacco Use    Smoking status: Former Smoker     Packs/day: 1.00     Years: 14.00     Pack years: 14.00     Quit date: 1983     Years since quitting: 39.4    Smokeless  tobacco: Never Used    Tobacco comment: Most recent tobacco use screening: 09/30/2017.     Vaping Use    Vaping Use: Never used   Substance and Sexual Activity    Alcohol use: Yes     Comment: One drink or less per week.    Social History Narrative    Exercise: none, walking in the house            Nutrition: has allowed foodlist 06/2020    Wt was 184lbs 11/2019    Check wt daily        Live with daughter and son-inlaw    3 grandchildren        Has 2 children        2 brothers and 2 sisters        Has good support system        Has medical living will 11/18/2020        The following sections were reviewed this encounter by the provider:   Tobacco   Allergies   Meds   Problems   Med Hx   Surg Hx   Fam Hx            ROS:  Review of Systems  Lost weight  Vitals:  BP 134/80 (BP Site: Left arm, Patient Position: Sitting, Cuff Size: Large)    Pulse 70    Temp 97 F (36.1 C) (Tympanic)    Resp 18    Ht 1.626 m (5\' 4" )    Wt 73.5 kg (162 lb)    BMI 27.81 kg/m      Objective:  Physical Exam:  Physical Exam  Constitutional:       General: She is not in acute distress.  HENT:      Head: Atraumatic.   Cardiovascular:      Rate and Rhythm: Regular rhythm.      Heart sounds: No murmur heard.  Pulmonary:      Effort: Pulmonary effort is normal.      Breath sounds: Normal breath sounds. No wheezing.   Abdominal:      Tenderness: There is no abdominal tenderness.   Musculoskeletal:         General: No swelling or tenderness.      Cervical back: Neck supple.   Skin:     Findings: Erythema (tongue tip pink/smooth) present.   Neurological:      General: No focal deficit present.   Psychiatric:         Behavior: Behavior normal.          Assessment:     1. Encounter for routine adult health examination with abnormal findings    2. Vitamin D deficiency  - Vitamin D,25 OH, Total    3. Mixed hyperlipidemia  - Lipid panel    4. Acquired hypothyroidism    5. Prediabetes  - Hemoglobin A1C    6. Stage 3a chronic kidney disease  -  Comprehensive metabolic panel    7. Fatigue, unspecified type  - CBC and differential    8. Encounter for screening mammogram for breast cancer  - Mammo Digital 2D Screening Bilateral W CAD    9. Geographic tongue  - triamcinolone (KENALOG) 0.1 % paste; Apply to affected tongue 3xday for max 14 days and off for 7 days and prn  Dispense: 10 g; Refill: 2      Plan:   Add steroid paste for tongue  Continue current other medicines  Side effect of meds discussed  Do healthy , high fiber, low cholesterol, low carb diet  Exercise most days  Plan for wellness visit every year  Follow up visit with [name] in 3 months  F/u with GI  F/u with cardiologist for chest pain      Satira Mccallum, MD

## 2020-11-18 NOTE — Telephone Encounter (Signed)
Please advise. Pt had appointment today and is requesting a medication for a sore on her tongue.

## 2020-11-18 NOTE — Patient Instructions (Addendum)
MEDICARE WELLNESS PERSONAL PREVENTION PLAN   As part of the Medicare Wellness portion of your visit today, we are providing you with this personalized preventative plan of care. We have listed below some of the preventative services that are recommended for patients based upon their age and gender. These recommendations are taken directly from the [place] [employer] (USPSTF) and the [employer] (ACIP).     Health Maintenance   Topic Date Due    Advance Directive on File  Has one, bring copy to office chart    Spirometry every Two Years  Not needed    MAMMOGRAM  12/14/2019    Shingrix Vaccine 50+ (2) 01/24/2020    FALLS RISK ANNUAL  11/28/2020    DEPRESSION SCREENING  11/28/2020    INFLUENZA VACCINE  01/12/2021    Statin Use  06/03/2021    Medicare Annual Wellness Visit  11/18/2021    COLONOSCOPY EVERY FIVE YEARS  07/07/2023    HEPATITIS C SCREENING  Completed    DXA Scan  Completed    COVID-19 Vaccine  Completed    Pneumonia Vaccine Age 34+  Completed            Immunization History   Administered Date(s) Administered    COVID-19 mRNA Vaccine Preservative Free 0.5 mL (MODERNA) 07/16/2019, 08/13/2019, 05/02/2020    DTaP 03/15/2011    INFLUENZA HIGH DOSE 65 YRS+ 04/29/2016, 03/21/2019, 04/14/2020    Influenza (Im) Preservative Free 02/13/2015    Influenza quadrivalent (IM) 6 months & up PRESERVED (Afluria/Fluzone) 04/14/2016, 03/14/2018    Influenza quadrivalent (IM) PF 3 Yrs & greater 03/07/2015, 03/14/2018    Pneumococcal 23 valent 04/14/2016    Pneumococcal Conjugate 13-Valent 04/29/2016    Tdap 06/14/2010, 03/15/2011        Your major risk factors:   Recommendations for improvement:      The list below includes many common screening recommendations but is not meant to be comprehensive. You may be eligible for other preventative services depending upon your personal risk factors.   Colorectal Cancer Screening - All adults age 64-75 should undergo periodic  colorectal cancer screening. The decision to screen for colorectal cancer in adults aged 55 to 69 years should be an individual one,taking into account your overall health and prior screening history.   Breast Cancer Screening - Women age 27-74 should have mammograms every other year (please note that this recommendation may not be appropriate for every woman - your physician can answer specific questions you may have). The USPSTF concludes that the current evidence is insufficient to assess the balance of benefits and harms of screening mammography in women aged 40 years or older.    Cervical Cancer Screening - Women over 80 do not require pap smears as long as prior screening has been normal and are not otherwise at high risk for cervical cancer. For women aged 26 to 26 years, the USPSTF recommends screening every 3 years with cervical cytology alone, every 5 years with high-risk human papillomavirus (hrHPV) testing alone, or every 5 years with hrHPV testing in combination with cytology (cotesting).   Osteoporosis Screening -  The USPSTF recommends screening for osteoporosis with bone measurement testing to prevent osteoporotic fractures in women 65 years and older.  Hepatitis C Screening - Recommend screening for hepatitis C virus (HCV) infection in all adults aged 34 to 80 years.  Lung cancer Screening - Recommend annual screening for lung cancer with low-dose computed tomography (LDCT) in adults ages  50 to 80 years who have a 20 pack-year smoking history and currently smoke or have quit within the past 15 years.  Recommended Vaccinations   Influenza one dose annually   Tetanus/diphtheria one booster every 10 years   Zoster/Shingles - Shingrix two doses after age 21 (second dose given 2-6 months after first dose)  Pneumovax (PPSV23) one dose for adults aged ?65 years  Prevnar(PCV13) shared clinical decision-making is recommended regarding administration of this vaccine to persons aged ?65 years who do not have  an immunocompromising condition, cerebrospinal fluid leak, or cochlear implant.     PERSONAL PREVENTION PLAN   Your Personal Prevention Plan is based on your overall health and your responses to the health questionnaire you completed. The following information is for you to review in addition to the recommendations, referrals, and tests we have discussed at your visit.     Physical Activity:   Physical activity can help you maintain a healthy weight, prevent or control illness, reduce stress, and sleep better. It can also help you improve your balance to avoid falls. Try to build up to and maintain a total of 30 minutes of activity each day. If you are able, try walking, doing yard or housework, and taking the stairs more often. You can also strengthen your muscles with exercises done while sitting or lying down.   Emotional Health:   Feeling down in the dumps or anxious every now and then is a natural part of life. If this feeling lasts for a few weeks or more, talk with me as soon as possible. It could be a sign of a problem that needs treatment. There are many types of treatment available.   Falls:   You can reduce your risk of falling by making changes in your home. Remove items that may cause tripping, improve lighting, and consider installing grab bars.   Talk with me if you have problems with balance and walking. To prevent falls, you may need your vision, hearing, or blood pressure checked. Exercises to improve your strength and balance, or using a cane or walker, may help. Review your medicines with me at every visit, because some can affect balance. Please be sure to let me know if you fall or are fearful you may fall.   Urinary Leakage:   Urine leakage is common, but it is not a normal part of aging. Talk with me about any urine leakage so that the cause can be found and treated. Treatment can include bladder training, exercises, medicine or surgery.   Pain:   We all have aches and pains at times, but  chronic pain can change how you feel and live every day. Please talk with me about any symptoms of chronic pain so that we can determine how best to treat.   Sleep:   Getting a good nights sleep is vital to your health and well-being and can help prevent or manage health problems. Often, sleep can be improved by changing behaviors, including when you go to bed and what you do before bed. Sleep apnea can cause problems such as struggling to stay awake during the day. Please let me know if you would like to learn more about improving your sleep and/or think you may have sleep apnea.   Seat Belt:   Please remember to wear a seat belt when driving or riding in a vehicle. It is one of the most important things you can do to stay safe in a car.   Nutrition:  Remember to eat plenty of fruits, vegetables, whole grains, and dairy. Drink at least 64 ounces (8 full glasses) of water a day, unless you have been advised to limit fluids.   Alcohol:   Alcohol can have a greater effect on older people, who may feel its effects at a lower amount. Older people should limit alcoholic drinks (no more than one a day for women and no more than two a day for men). Please let me know if alcohol use becomes a problem.   Tobacco:   Not smoking or using other forms of tobacco is one of the most important things you can do for your health. Here is some more information about the importance of quitting smoking and how to quit smoking - [employer]com.pt  Advance Directives:   There may come a time when medical decisions need to be made on your behalf. Please talk with your family, and with me, about your wishes. It is important to provide information about your decisions, and any formal advance directives, for your medical record. Here is additional information on advanced directives - [email or web address]-[email or web address]  Additional Support:   Sometimes it can be challenging to manage all aspects of  daily life. Finding the right support can help you maintain or improve your health and independence. Please let me know if you would like to talk further about finding resources to assist you.   MEDICARE WELLNESS PERSONAL PREVENTION PLAN   As part of the Medicare Wellness portion of your visit today, we are providing you with this personalized preventative plan of care. We have listed below some of the preventative services that are recommended for patients based upon their age and gender. These recommendations are taken directly from the [place] [employer] (USPSTF) and the [employer] (ACIP).     Health Maintenance   Topic Date Due    Advance Directive on File  Never done    Spirometry every Two Years  Never done    MAMMOGRAM  12/14/2019    Shingrix Vaccine 50+ (2) 01/24/2020    FALLS RISK ANNUAL  11/28/2020    DEPRESSION SCREENING  11/28/2020    INFLUENZA VACCINE  01/12/2021    Statin Use  06/03/2021    Medicare Annual Wellness Visit  11/18/2021    COLONOSCOPY EVERY FIVE YEARS  07/07/2023    HEPATITIS C SCREENING  Completed    DXA Scan  Completed    COVID-19 Vaccine  Completed    Pneumonia Vaccine Age 36+  Completed       Health Maintenance Topics with due status: Overdue       Topic Date Due    Advance Directive on File Never done    Spirometry every Two Years Never done    MAMMOGRAM 12/14/2019    Shingrix Vaccine 50+ 01/24/2020        Immunization History   Administered Date(s) Administered    COVID-19 mRNA Vaccine Preservative Free 0.5 mL (MODERNA) 07/16/2019, 08/13/2019, 05/02/2020    DTaP 03/15/2011    INFLUENZA HIGH DOSE 65 YRS+ 04/29/2016, 03/21/2019, 04/14/2020    Influenza (Im) Preservative Free 02/13/2015    Influenza quadrivalent (IM) 6 months & up PRESERVED (Afluria/Fluzone) 04/14/2016, 03/14/2018    Influenza quadrivalent (IM) PF 3 Yrs & greater 03/07/2015, 03/14/2018    Pneumococcal 23 valent 04/14/2016    Pneumococcal Conjugate 13-Valent  04/29/2016    Tdap 06/14/2010, 03/15/2011        Your major risk factors:   Recommendations  for improvement:      The list below includes many common screening recommendations but is not meant to be comprehensive. You may be eligible for other preventative services depending upon your personal risk factors.   Colorectal Cancer Screening - All adults age 17-75 should undergo periodic colorectal cancer screening. The decision to screen for colorectal cancer in adults aged 83 to 50 years should be an individual one,taking into account your overall health and prior screening history.   Breast Cancer Screening - Women age 84-74 should have mammograms every other year (please note that this recommendation may not be appropriate for every woman - your physician can answer specific questions you may have). The USPSTF concludes that the current evidence is insufficient to assess the balance of benefits and harms of screening mammography in women aged 67 years or older.    Cervical Cancer Screening - Women over 61 do not require pap smears as long as prior screening has been normal and are not otherwise at high risk for cervical cancer. For women aged 51 to 80 years, the USPSTF recommends screening every 3 years with cervical cytology alone, every 5 years with high-risk human papillomavirus (hrHPV) testing alone, or every 5 years with hrHPV testing in combination with cytology (cotesting).   Osteoporosis Screening -  The USPSTF recommends screening for osteoporosis with bone measurement testing to prevent osteoporotic fractures in women 65 years and older.  Hepatitis C Screening - Recommend screening for hepatitis C virus (HCV) infection in all adults aged 56 to 70 years.  Lung cancer Screening - Recommend annual screening for lung cancer with low-dose computed tomography (LDCT) in adults ages 71 to 47 years who have a 20 pack-year smoking history and currently smoke or have quit within the past 15 years.  Recommended  Vaccinations   Influenza one dose annually   Tetanus/diphtheria one booster every 10 years   Zoster/Shingles - Shingrix two doses after age 40 (second dose given 2-6 months after first dose)  Pneumovax (PPSV23) one dose for adults aged ?65 years  Prevnar(PCV13) shared clinical decision-making is recommended regarding administration of this vaccine to persons aged ?65 years who do not have an immunocompromising condition, cerebrospinal fluid leak, or cochlear implant.     PERSONAL PREVENTION PLAN   Your Personal Prevention Plan is based on your overall health and your responses to the health questionnaire you completed. The following information is for you to review in addition to the recommendations, referrals, and tests we have discussed at your visit.     Physical Activity:   Physical activity can help you maintain a healthy weight, prevent or control illness, reduce stress, and sleep better. It can also help you improve your balance to avoid falls. Try to build up to and maintain a total of 30 minutes of activity each day. If you are able, try walking, doing yard or housework, and taking the stairs more often. You can also strengthen your muscles with exercises done while sitting or lying down.   Emotional Health:   Feeling down in the dumps or anxious every now and then is a natural part of life. If this feeling lasts for a few weeks or more, talk with me as soon as possible. It could be a sign of a problem that needs treatment. There are many types of treatment available.   Falls:   You can reduce your risk of falling by making changes in your home. Remove items that may cause tripping, improve lighting, and consider  installing grab bars.   Talk with me if you have problems with balance and walking. To prevent falls, you may need your vision, hearing, or blood pressure checked. Exercises to improve your strength and balance, or using a cane or walker, may help. Review your medicines with me at every visit,  because some can affect balance. Please be sure to let me know if you fall or are fearful you may fall.   Urinary Leakage:   Urine leakage is common, but it is not a normal part of aging. Talk with me about any urine leakage so that the cause can be found and treated. Treatment can include bladder training, exercises, medicine or surgery.   Pain:   We all have aches and pains at times, but chronic pain can change how you feel and live every day. Please talk with me about any symptoms of chronic pain so that we can determine how best to treat.   Sleep:   Getting a good nights sleep is vital to your health and well-being and can help prevent or manage health problems. Often, sleep can be improved by changing behaviors, including when you go to bed and what you do before bed. Sleep apnea can cause problems such as struggling to stay awake during the day. Please let me know if you would like to learn more about improving your sleep and/or think you may have sleep apnea.   Seat Belt:   Please remember to wear a seat belt when driving or riding in a vehicle. It is one of the most important things you can do to stay safe in a car.   Nutrition:   Remember to eat plenty of fruits, vegetables, whole grains, and dairy. Drink at least 64 ounces (8 full glasses) of water a day, unless you have been advised to limit fluids.   Alcohol:   Alcohol can have a greater effect on older people, who may feel its effects at a lower amount. Older people should limit alcoholic drinks (no more than one a day for women and no more than two a day for men). Please let me know if alcohol use becomes a problem.   Tobacco:   Not smoking or using other forms of tobacco is one of the most important things you can do for your health. Here is some more information about the importance of quitting smoking and how to quit smoking - [employer]com.pt  Advance Directives:   There may come a time when medical decisions need  to be made on your behalf. Please talk with your family, and with me, about your wishes. It is important to provide information about your decisions, and any formal advance directives, for your medical record. Here is additional information on advanced directives - [email or web address]  Additional Support:   Sometimes it can be challenging to manage all aspects of daily life. Finding the right support can help you maintain or improve your health and independence. Please let me know if you would like to talk further about finding resources to assist you.

## 2020-11-19 NOTE — Telephone Encounter (Signed)
Pls advise I e-sent yesterday

## 2020-11-20 ENCOUNTER — Encounter (INDEPENDENT_AMBULATORY_CARE_PROVIDER_SITE_OTHER): Payer: Self-pay | Admitting: Family Medicine

## 2020-11-20 DIAGNOSIS — D649 Anemia, unspecified: Secondary | ICD-10-CM

## 2020-11-20 NOTE — Progress Notes (Signed)
Hi Nurse/Clinical staff    Please advise patient I reviewed the results and below are the comments and recommendations:    #1 concern, moderate to severe anemia seen with hgb 7.4. add iron  , tibc, ferritin to lab  Check if losing blood from anywhere. Call GI soon for possible endoscopy/colonoscopy to look for blood loss. This is giving her shortness of breath  Chronic kidney disease stable    Excellent cholesterol now    Potassium slight high, make sure no potassium supplement  Liver enzyme mild up to monitor    Thank you.  Dr. Jonathon Bellows

## 2020-11-26 NOTE — Telephone Encounter (Signed)
Dr Jonathon Bellows  Lorain Childes and please advise  Thanks  Moreen Fowler LPN

## 2020-11-26 NOTE — Progress Notes (Signed)
pls help see hematologist asap for severe anemia , evaluation, need for iron tx    Pls advise pt I reviewed her iron studies    Take iron like advised by GI and follow up with them soon . She appears to be losing blood from somewhere.    2. Nurse pls help her get in with hematologis's office sooner    3. I am not clear on B vitamin  request. Anything dentist recommends , dentist should be able to order. Let me know if problems

## 2020-11-27 ENCOUNTER — Encounter (INDEPENDENT_AMBULATORY_CARE_PROVIDER_SITE_OTHER): Payer: Self-pay

## 2020-11-27 ENCOUNTER — Other Ambulatory Visit (INDEPENDENT_AMBULATORY_CARE_PROVIDER_SITE_OTHER): Payer: Self-pay | Admitting: Family Medicine

## 2020-11-27 DIAGNOSIS — E782 Mixed hyperlipidemia: Secondary | ICD-10-CM

## 2020-11-27 NOTE — Telephone Encounter (Signed)
Last visit was 11/18/20 for AWV and med follow up  Refill sent in

## 2020-12-01 NOTE — Telephone Encounter (Signed)
Dr KC  FYI  Thanks

## 2020-12-16 DIAGNOSIS — D649 Anemia, unspecified: Secondary | ICD-10-CM

## 2020-12-16 DIAGNOSIS — E876 Hypokalemia: Secondary | ICD-10-CM

## 2020-12-16 DIAGNOSIS — I214 Non-ST elevation (NSTEMI) myocardial infarction: Secondary | ICD-10-CM | POA: Insufficient documentation

## 2020-12-16 HISTORY — DX: Hypokalemia: E87.6

## 2020-12-16 HISTORY — DX: Anemia, unspecified: D64.9

## 2020-12-19 ENCOUNTER — Ambulatory Visit (INDEPENDENT_AMBULATORY_CARE_PROVIDER_SITE_OTHER): Payer: Medicare Other | Admitting: Family Medicine

## 2020-12-19 ENCOUNTER — Encounter (INDEPENDENT_AMBULATORY_CARE_PROVIDER_SITE_OTHER): Payer: Self-pay | Admitting: Family Medicine

## 2020-12-19 ENCOUNTER — Telehealth (INDEPENDENT_AMBULATORY_CARE_PROVIDER_SITE_OTHER): Payer: Self-pay | Admitting: Family Medicine

## 2020-12-19 VITALS — BP 126/64 | HR 68 | Temp 98.1°F | Resp 12 | Ht 64.0 in | Wt 160.0 lb

## 2020-12-19 DIAGNOSIS — K219 Gastro-esophageal reflux disease without esophagitis: Secondary | ICD-10-CM

## 2020-12-19 DIAGNOSIS — I2510 Atherosclerotic heart disease of native coronary artery without angina pectoris: Secondary | ICD-10-CM

## 2020-12-19 DIAGNOSIS — K449 Diaphragmatic hernia without obstruction or gangrene: Secondary | ICD-10-CM

## 2020-12-19 DIAGNOSIS — E782 Mixed hyperlipidemia: Secondary | ICD-10-CM

## 2020-12-19 DIAGNOSIS — I10 Essential (primary) hypertension: Secondary | ICD-10-CM

## 2020-12-19 DIAGNOSIS — R7303 Prediabetes: Secondary | ICD-10-CM

## 2020-12-19 DIAGNOSIS — D509 Iron deficiency anemia, unspecified: Secondary | ICD-10-CM

## 2020-12-19 MED ORDER — SUCRALFATE 1 GM/10ML PO SUSP
1.0000 g | Freq: Four times a day (QID) | ORAL | 1 refills | Status: DC
Start: 2020-12-19 — End: 2020-12-19

## 2020-12-19 MED ORDER — SUCRALFATE 1 GM/10ML PO SUSP
1.0000 g | Freq: Four times a day (QID) | ORAL | 1 refills | Status: DC
Start: 2020-12-19 — End: 2021-01-16

## 2020-12-19 NOTE — Telephone Encounter (Signed)
Dr Jonathon Bellows  Patient went to pharmacy and Sucralfate is 220$ and is not covered by insurance and doing a PA will still not cover medication according to pharmacy.  Patient wanted to know what is ok to take.  She is going to take famotidine until notified by [place] on new medication  Thanks  [name]

## 2020-12-19 NOTE — Progress Notes (Signed)
Subjective:      Patient ID: Christine Bridges is a 70 y.o. female.    Chief Complaint:  Chief Complaint   Patient presents with    ER Follow-up       HPI:    [age] yo F presents for a ER follow up   She was in [facility] ER on 12/16/20 for chest and abdominal pain  She was monitored, told to stop rosuvastatin, famotidine and biotin and started on atorvastatin 80mg  and then sent home  She was told to follow up with PCP about medication interactions    2. Cholesterol: had done well with rosuvastatin    3. Anemia: getting iron transfusion and better to 9.7    4. Gerd: seeing GI this Wednesday ., meds so far has not helped    5. Getting chest pain in the afternoon  Got bad on 12/16/2020 and went to ER    Vitamin d def: wondering ig getting too much  Clarifying calcium requirement      Problem List:  Patient Active Problem List   Diagnosis    GAD (generalized anxiety disorder)    Vitamin D deficiency    Fatigue, unspecified type    Mixed hyperlipidemia    Hypothyroidism    Benign essential hypertension    GERD (gastroesophageal reflux disease)    Hiatal hernia    Prediabetes    Stage 3a chronic kidney disease    On long term drug therapy    Allergic rhinitis    History of colonic polyps    Osteopenia    Palpitations    Polyp of colon    Rosacea    Overweight (BMI 25.0-29.9)    Primary osteoarthritis of both knees    Coronary artery disease involving native coronary artery of native heart without angina pectoris    Hypokalemia    Anemia    NSTEMI (non-ST elevated myocardial infarction)       Current Medications:  Current Outpatient Medications   Medication Sig Dispense Refill    aspirin 81 MG EC tablet Take 81 mg by mouth daily      atorvastatin (LIPITOR) 80 MG tablet Take 80 mg by mouth daily      cetirizine (ZyrTEC) 10 MG tablet every 24 hours      clopidogrel (PLAVIX) 75 mg tablet Take 75 mg by mouth      CORAL CALCIUM-MAGNESIUM-VIT D PO Take by mouth      Iron-Vitamin C (VITRON-C PO) Take by mouth      isosorbide  mononitrate (IMDUR) 30 MG 24 hr tablet Take 30 mg by mouth daily      levothyroxine (Euthyrox) 75 MCG tablet Take 1 tablet (75 mcg total) by mouth daily 90 tablet 3    lisinopril (ZESTRIL) 5 MG tablet Take 5 mg by mouth daily      metoprolol tartrate (LOPRESSOR) 25 MG tablet 25 mg 2 (two) times daily      Multiple Vitamins-Minerals (MULTIVITAMIN ADULTS 50+ PO) multivitamin      pantoprazole (PROTONIX) 40 MG tablet Take 40 mg by mouth      vitamin D3 (CHOLECALCIFEROL) 10 MCG (400 UNIT) tablet Take 2,000 Units by mouth daily      BIOTIN PO Take by mouth      famotidine (PEPCID) 40 MG tablet Take 40 mg by mouth 2 (two) times daily      rosuvastatin (CRESTOR) 20 MG tablet TAKE 1 TABLET BY MOUTH EVERY BEDTIME FOR 90 DAYS 90 tablet 1  sucralfate (Carafate) 1 GM/10ML suspension Take 10 mLs (1 g total) by mouth every 6 (six) hours Before meals and before bed 414 mL 1     No current facility-administered medications for this visit.       Allergies:  Allergies   Allergen Reactions    Metronidazole        Past Medical History:  Past Medical History:   Diagnosis Date    Abnormal vision     Has had an annual eye exam in 2016 and wears glasses which are new.     Chest tightness 02/21/2013    Osteoporosis     Rectal bleeding 02/21/2013       Past Surgical History:  Past Surgical History:   Procedure Laterality Date    Back/Spine surgery  06/15/1983    COLONOSCOPY  07/06/2018    Repeat in 5 years.     COLONOSCOPY  04/14/2013    Normal.     ENDOSCOPY UPPER GI  2016    normal, done x2    FOOT SURGERY Left 05/14/2010    Left foot.     Oral/Dental surgery  12/12/2017    OTHER SURGICAL HISTORY  06/14/2016    heart work up, stress test negative.    WRIST FRACTURE SURGERY Left 04/14/2013    Left wrist fracture.        Family History:  Family History   Problem Relation Age of Onset    Stroke Mother         Cerebrovascular accident.     Alzheimer's disease Mother     Heart disease Father     Stroke Father         Cerebrovascular accident.      Heart disease Brother     Other Brother         Heart valve replacement.     Colon cancer Paternal Grandmother         Malignant tumor of colon.        Social History:  Social History     Socioeconomic History    Marital status: Single   Tobacco Use    Smoking status: Former     Packs/day: 1.00     Years: 14.00     Pack years: 14.00     Types: Cigarettes     Quit date: 1983     Years since quitting: 39.5    Smokeless tobacco: Never    Tobacco comments:     Most recent tobacco use screening: 09/30/2017.     Vaping Use    Vaping Use: Never used   Substance and Sexual Activity    Alcohol use: Yes     Comment: One drink or less per week.    Social History Narrative    Exercise: none, walking in the house            Nutrition: has allowed foodlist 06/2020    Wt was 184lbs 11/2019    Check wt daily        Live with daughter and son-inlaw    3 grandchildren        Has 2 children        2 brothers and 2 sisters        Has good support system        Has medical living will 11/18/2020        The following sections were reviewed this encounter by the provider:   Tobacco  Allergies  Meds  Problems  Med Hx  Surg Hx  Fam Hx           ROS:  Review of Systems    Vitals:  BP 126/64 (BP Site: Left arm, Patient Position: Sitting, Cuff Size: Medium)   Pulse 68   Temp 98.1 F (36.7 C) (Tympanic)   Resp 12   Ht 1.626 m (5\' 4" )   Wt 72.6 kg (160 lb)   BMI 27.46 kg/m      Objective:     Physical Exam:  Physical Exam  Constitutional:       General: She is not in acute distress.  HENT:      Head: Atraumatic.   Cardiovascular:      Rate and Rhythm: Regular rhythm.      Heart sounds: No murmur heard.  Pulmonary:      Effort: Pulmonary effort is normal.      Breath sounds: Normal breath sounds. No wheezing.   Abdominal:      Tenderness: There is no abdominal tenderness.   Musculoskeletal:         General: No swelling or tenderness.      Cervical back: Neck supple.   Neurological:      General: No focal deficit present.    Psychiatric:         Behavior: Behavior normal.        Assessment:     1. Hiatal hernia  - sucralfate (Carafate) 1 GM/10ML suspension; Take 10 mLs (1 g total) by mouth every 6 (six) hours Before meals and before bed  Dispense: 414 mL; Refill: 1    2. Gastroesophageal reflux disease without esophagitis  - sucralfate (Carafate) 1 GM/10ML suspension; Take 10 mLs (1 g total) by mouth every 6 (six) hours Before meals and before bed  Dispense: 414 mL; Refill: 1    3. Benign essential hypertension    4. Prediabetes    5. Mixed hyperlipidemia    6. Coronary artery disease involving native coronary artery of native heart without angina pectoris    7. Iron deficiency anemia, unspecified iron deficiency anemia type    Plan:     The following activities were performed on the date of service:  preparing to see the patient: - chart review   - review of prior labs  - review of prior imaging tests  - review of consultant reports  - review of prior hospitalizations  - review of prior ED visits  performing a medically appropriate examination and/or evaluation   counseling and educating the patient/family/caregiver  ordering medications, tests, or procedures  referring and communicating with other health care professionals (when not separately reported)   documenting clinical information in the electronic or other health records    Total time spent performing activities on date of service:     REVIEWED meds, supplements and counselled on MV, calcium and vitamin d3    Aim for calcium 1200mg /day from food and supplement  Aim for vitamin d3 1000iu approx daily    Add carafate  Calling and left msg for hospital doctor to find why meds statin changed  Continue current medicines  Side effect of meds discussed  Do healthy , high fiber, low cholesterol, low carb diet  Exercise most days  Plan for wellness visit every year  Follow up visit with [name] in 3 months    Genella Rife info given  F/u with GI,  cardiologist, hematologist           [employer]  Jonathon Bellows, MD

## 2020-12-21 NOTE — Telephone Encounter (Signed)
Pls advise ok to use famotidine 20mg  twice a day, mylanta as needed until seeing GI

## 2020-12-22 NOTE — Telephone Encounter (Signed)
Patient notified to take famotidine 20mg  bid and mylanta as needed

## 2020-12-26 ENCOUNTER — Telehealth (INDEPENDENT_AMBULATORY_CARE_PROVIDER_SITE_OTHER): Payer: Self-pay | Admitting: Family Medicine

## 2020-12-26 NOTE — Telephone Encounter (Signed)
Request to Send Records:    Who is requesting records? [facility]  Facility receiving records: [facility] [place]   Fax#: [phone]   Phone #: [phone]  What documents are needed: office note, lab work, stool studies and/or gi radiology performed with in the last 3 months  Date records are needed by: appointment 01/22/2021

## 2020-12-26 NOTE — Telephone Encounter (Signed)
Faxed  Office visit note: 12/19/20 & 11/18/20  Laboratory results: 11/18/20  Recent Imaging Reports: no imaging on file

## 2021-01-01 ENCOUNTER — Encounter (INDEPENDENT_AMBULATORY_CARE_PROVIDER_SITE_OTHER): Payer: Self-pay | Admitting: Family Medicine

## 2021-01-05 DIAGNOSIS — R0789 Other chest pain: Secondary | ICD-10-CM | POA: Insufficient documentation

## 2021-01-05 DIAGNOSIS — D509 Iron deficiency anemia, unspecified: Secondary | ICD-10-CM | POA: Insufficient documentation

## 2021-01-09 HISTORY — PX: CARDIAC CATHETERIZATION: SHX172

## 2021-01-16 ENCOUNTER — Encounter (INDEPENDENT_AMBULATORY_CARE_PROVIDER_SITE_OTHER): Payer: Self-pay | Admitting: Family Medicine

## 2021-01-16 ENCOUNTER — Ambulatory Visit (INDEPENDENT_AMBULATORY_CARE_PROVIDER_SITE_OTHER): Payer: Medicare Other | Admitting: Family Medicine

## 2021-01-16 VITALS — BP 120/72 | HR 80 | Temp 97.4°F | Resp 12 | Ht 64.0 in | Wt 153.0 lb

## 2021-01-16 DIAGNOSIS — I25119 Atherosclerotic heart disease of native coronary artery with unspecified angina pectoris: Secondary | ICD-10-CM

## 2021-01-16 DIAGNOSIS — Z1231 Encounter for screening mammogram for malignant neoplasm of breast: Secondary | ICD-10-CM

## 2021-01-16 DIAGNOSIS — N1831 Chronic kidney disease, stage 3a: Secondary | ICD-10-CM

## 2021-01-16 DIAGNOSIS — I119 Hypertensive heart disease without heart failure: Secondary | ICD-10-CM | POA: Insufficient documentation

## 2021-01-16 DIAGNOSIS — R7303 Prediabetes: Secondary | ICD-10-CM

## 2021-01-16 LAB — BASIC METABOLIC PANEL
Anion Gap: 9 (ref 5.0–15.0)
BUN: 14 mg/dL (ref 7.0–19.0)
CO2: 25 mEq/L (ref 21–29)
Calcium: 10.3 mg/dL — ABNORMAL HIGH (ref 7.9–10.2)
Chloride: 100 mEq/L (ref 100–111)
Creatinine: 1.1 mg/dL (ref 0.4–1.5)
Glucose: 112 mg/dL — ABNORMAL HIGH (ref 70–100)
Potassium: 5 mEq/L (ref 3.5–5.1)
Sodium: 134 mEq/L — ABNORMAL LOW (ref 136–145)

## 2021-01-16 LAB — GFR: EGFR: 48.9

## 2021-01-16 LAB — HEMOLYSIS INDEX: Hemolysis Index: 7 Index (ref 0–24)

## 2021-01-16 NOTE — Progress Notes (Signed)
[facility] [place]                       Date of Exam: 01/16/2021 1:19 PM        Patient ID: Christine Bridges is a 70 y.o. female.  Attending Physician: Satira Mccallum, MD        Chief Complaint:    Chief Complaint   Patient presents with    Heart Problem               HPI:    [age] yo F presents for a follow up  She had cardiac procedure on 01/09/21 and was found to have calcium build up around her cardia stent  She has calcium build up removed  She wanted to consult with PCP  Had dizziness and one bp /heart med stopped, not sure if isosorbide or lisino    Now cp in the afternoon better after stent and cath    2. Iron def anemia: getting transfusion. Told to stop iron pills    3 .  Seeing another GI soon, no blood in stool seen    4. Kidney function looking worse recently    5. Prediabetes: working on staying on [employer], staying away from sweets          Problem List:    Patient Active Problem List   Diagnosis    GAD (generalized anxiety disorder)    Vitamin D deficiency    Fatigue, unspecified type    Mixed hyperlipidemia    Hypothyroidism    Benign essential hypertension    GERD (gastroesophageal reflux disease)    Hiatal hernia    Prediabetes    Stage 3a chronic kidney disease    On long term drug therapy    Allergic rhinitis    History of colonic polyps    Osteopenia    Palpitations    Polyp of colon    Rosacea    Overweight (BMI 25.0-29.9)    Primary osteoarthritis of both knees    Coronary artery disease involving native coronary artery of native heart without angina pectoris    Hypokalemia    Anemia    NSTEMI (non-ST elevated myocardial infarction)    Hypertensive heart disease without heart failure             Current Meds:    Outpatient Medications Marked as Taking for the 01/16/21 encounter (Office Visit) with Satira Mccallum, MD   Medication Sig Dispense Refill    aspirin 81 MG EC tablet Take 81 mg by mouth daily      cetirizine (ZyrTEC) 10 MG tablet every 24 hours       clopidogrel (PLAVIX) 75 mg tablet Take 75 mg by mouth      CORAL CALCIUM-MAGNESIUM-VIT D PO Take by mouth      levothyroxine (Euthyrox) 75 MCG tablet Take 1 tablet (75 mcg total) by mouth daily 90 tablet 3    lisinopril (ZESTRIL) 5 MG tablet Take 5 mg by mouth daily      metoprolol tartrate (LOPRESSOR) 25 MG tablet 25 mg 2 (two) times daily      Multiple Vitamins-Minerals (MULTIVITAMIN ADULTS 50+ PO) multivitamin      pantoprazole (PROTONIX) 40 MG tablet Take 40 mg by mouth      rosuvastatin (CRESTOR) 20 MG tablet TAKE 1 TABLET BY MOUTH EVERY BEDTIME FOR 90 DAYS 90 tablet 1    vitamin D3 (CHOLECALCIFEROL) 10 MCG (400 UNIT) tablet Take  2,000 Units by mouth daily            Allergies:    Allergies   Allergen Reactions    Metronidazole              Past Surgical History:    Past Surgical History:   Procedure Laterality Date    Back/Spine surgery  06/15/1983    CARDIAC CATHETERIZATION  01/09/2021    positive for restenosis    COLONOSCOPY  07/06/2018    Repeat in 5 years.     COLONOSCOPY  04/14/2013    Normal.     ENDOSCOPY UPPER GI  2016    normal, done x2    FOOT SURGERY Left 05/14/2010    Left foot.     Oral/Dental surgery  12/12/2017    OTHER SURGICAL HISTORY  06/14/2016    heart work up, stress test negative.    WRIST FRACTURE SURGERY Left 04/14/2013    Left wrist fracture.            Family History:    Family History   Problem Relation Age of Onset    Stroke Mother         Cerebrovascular accident.     Alzheimer's disease Mother     Heart disease Father     Stroke Father         Cerebrovascular accident.     Heart disease Brother     Other Brother         Heart valve replacement.     Colon cancer Paternal Grandmother         Malignant tumor of colon.            Social History:    Social History     Tobacco Use    Smoking status: Former     Packs/day: 1.00     Years: 14.00     Pack years: 14.00     Types: Cigarettes     Quit date: 1983     Years since quitting: 39.6    Smokeless tobacco: Never    Tobacco comments:      Most recent tobacco use screening: 09/30/2017.     Vaping Use    Vaping Use: Never used   Substance Use Topics    Alcohol use: Yes     Comment: One drink or less per week.            The following sections were reviewed this encounter by the provider:   Tobacco   Allergies   Meds   Problems   Med Hx   Surg Hx   Fam Hx              Vital Signs:    BP 120/72 (BP Site: Left arm, Patient Position: Sitting, Cuff Size: Medium)    Pulse 80    Temp 97.4 F (36.3 C) (Tympanic)    Resp 12    Ht 1.626 m (5\' 4" )    Wt 69.4 kg (153 lb)    BMI 26.26 kg/m          ROS:    Review of Systems   Wt loss 7lbs in past month        Physical Exam:    Physical Exam  Constitutional:       General: She is not in acute distress.  HENT:      Head: Atraumatic.   Cardiovascular:      Rate and Rhythm: Regular rhythm.  Heart sounds: Murmur heard.   Pulmonary:      Effort: Pulmonary effort is normal.      Breath sounds: Normal breath sounds. No wheezing.   Abdominal:      Tenderness: There is no abdominal tenderness.   Musculoskeletal:         General: No swelling or tenderness.      Cervical back: Neck supple.   Neurological:      General: No focal deficit present.   Psychiatric:         Behavior: Behavior normal.            Assessment:    1. Stage 3a chronic kidney disease  - Basic Metabolic Panel    2. Encounter for screening mammogram for breast cancer  - Mammo Digital 2D Screening Bilateral W CAD    3. Coronary artery disease involving native coronary artery of native heart with angina pectoris    4. Prediabetes    5. Hypertensive heart disease without heart failure          Plan:      Continue current medicines  Let us know which one stopped for sure  Side effect of meds discussed  Do healthy , high fiber, low cholesterol, low carb diet  Exercise most days  Plan for wellness visit every year  Follow up visit with [name] in 3 months            Follow-up:    Return if symptoms worsen or fail to  improve.         Satira Mccallum, MD

## 2021-01-18 NOTE — Progress Notes (Signed)
Hello Ms.  Bridges:    I have reviewed your results.    Chronic kidney disease stable at 3A stage where you have been in past 2 years    Take good care and practice healthy habits  Dr. Jonathon Bellows

## 2021-01-19 ENCOUNTER — Telehealth (INDEPENDENT_AMBULATORY_CARE_PROVIDER_SITE_OTHER): Payer: Self-pay | Admitting: Family Medicine

## 2021-01-19 NOTE — Telephone Encounter (Signed)
FYI:    Name of Requester: Kathie Rhodes Laurette Schimke)  FYI: faxed over last office notes / labs   Last DOS:   Requester's preferred ph: [phone]  Please note: Fax # [phone]

## 2021-01-22 ENCOUNTER — Telehealth (INDEPENDENT_AMBULATORY_CARE_PROVIDER_SITE_OTHER): Payer: Self-pay

## 2021-01-22 NOTE — Telephone Encounter (Signed)
Received a request to forward records for an upcoming appointment    Faxed to [phone]

## 2021-01-26 ENCOUNTER — Encounter (INDEPENDENT_AMBULATORY_CARE_PROVIDER_SITE_OTHER): Payer: Self-pay | Admitting: Family Medicine

## 2021-01-26 ENCOUNTER — Telehealth (INDEPENDENT_AMBULATORY_CARE_PROVIDER_SITE_OTHER): Payer: Self-pay | Admitting: Family Medicine

## 2021-01-26 DIAGNOSIS — R928 Other abnormal and inconclusive findings on diagnostic imaging of breast: Secondary | ICD-10-CM

## 2021-01-26 DIAGNOSIS — Z1231 Encounter for screening mammogram for malignant neoplasm of breast: Secondary | ICD-10-CM

## 2021-01-26 NOTE — Telephone Encounter (Signed)
We received a fax requesting a physician order for a diagnostic mammogram w/US if necessary    This will be placed in Dr Leslye Peer bucket to be addressed    *Patients daughter Constance Holster sent a MyChart message to Dr Jonathon Bellows asking for a 6 month diagnostic left breast mammogram referral.(Overdue)  She is scheduled at [facility] in [place]. They are also aksing for a routine mammogram bilateral for November

## 2021-01-26 NOTE — Telephone Encounter (Signed)
Orders are in EMR

## 2021-02-04 ENCOUNTER — Other Ambulatory Visit: Payer: Self-pay | Admitting: Family Medicine

## 2021-02-04 NOTE — Progress Notes (Signed)
Hello Ms.  Chatmon:    I have reviewed your results.    L breast study benign. Next check in 6 months with routine mammo      Take good care and practice healthy habits  Dr. Jonathon Bellows

## 2021-02-18 ENCOUNTER — Encounter (INDEPENDENT_AMBULATORY_CARE_PROVIDER_SITE_OTHER): Payer: Self-pay | Admitting: Family Medicine

## 2021-03-17 ENCOUNTER — Telehealth (INDEPENDENT_AMBULATORY_CARE_PROVIDER_SITE_OTHER): Payer: Medicare Other | Admitting: Family Medicine

## 2021-03-17 ENCOUNTER — Encounter (INDEPENDENT_AMBULATORY_CARE_PROVIDER_SITE_OTHER): Payer: Self-pay | Admitting: Family Medicine

## 2021-03-17 VITALS — BP 128/69 | HR 87 | Temp 98.0°F | Ht 64.0 in | Wt 160.0 lb

## 2021-03-17 DIAGNOSIS — I25119 Atherosclerotic heart disease of native coronary artery with unspecified angina pectoris: Secondary | ICD-10-CM

## 2021-03-17 DIAGNOSIS — J208 Acute bronchitis due to other specified organisms: Secondary | ICD-10-CM

## 2021-03-17 MED ORDER — ALBUTEROL SULFATE HFA 108 (90 BASE) MCG/ACT IN AERS
2.0000 | INHALATION_SPRAY | RESPIRATORY_TRACT | 0 refills | Status: DC | PRN
Start: 2021-03-17 — End: 2021-12-04

## 2021-03-17 NOTE — Progress Notes (Signed)
[name]                       Date of Virtual Visit: 03/17/2021 2:12 PM        Patient ID: Christine Bridges is a 70 y.o. female.  Attending Physician: Starleen Blue, MD       Telemedicine Eligibility:    State Location:  [x]  [place]  []  [place]  []  [place] []  [place]  []  Other:    Physical Location:  [x]  Home  []  Office  []  Other:    Patient Identity Verification:  [x]  State Issued ID  []  Insurance Eligibility Check  []  Other:    Physical Address and Phone Verification (for 911):  [x]  Yes  []  No    Personal identity shared with patient:  [x]  Yes  []  No    Education on nature of video visit shared with patient:  [x]  Yes  []  No    Emergency plan agreed upon with patient:  [x]  Yes  []  No    Visit terminated since not appropriate for virtual care:  [x]  N/A  []  Reason:    This visit is being conducted via video and telephone.  Verbal consent has been obtained from the patient to conduct a video and telephone visit to minimize exposure to COVID-19: yes.  Patient consents to bill insurance for this visit.       Chief Complaint:    No chief complaint on file.                HPI:    70 YO Female  Consents to VV and billing     Was having a cough and took an OTC medication that caused her BP to go up  Went to UC in [place] due to elevated BP  States tested negative for flu and COVID. Also had a negative chest xray per patient.  Was given an antibiotic and finished it 03/10/2021  Started to feel better.  States on 03/15/2021 started having head congestion, runny nose, and sore throat  States throat is doing better  Currently has a runny nose  Non-productive cough  States has been having SOB all month. Is currently better than prior to antibiotic  Slight wheezing  Denies GI symptoms  Runny nose is clear  Other family members have similar symptoms and covid test negative  Patient has CAD s/p stents          Problem List:    Patient Active Problem List   Diagnosis    GAD  (generalized anxiety disorder)    Vitamin D deficiency    Fatigue, unspecified type    Mixed hyperlipidemia    Hypothyroidism    Benign essential hypertension    GERD (gastroesophageal reflux disease)    Hiatal hernia    Prediabetes    Stage 3a chronic kidney disease    On long term drug therapy    Allergic rhinitis    History of colonic polyps    Osteopenia    Palpitations    Polyp of colon    Rosacea    Overweight (BMI 25.0-29.9)    Primary osteoarthritis of both knees    Coronary artery disease involving native coronary artery of native heart without angina pectoris    Hypokalemia    Anemia    NSTEMI (non-ST elevated myocardial infarction)    Hypertensive heart disease without heart failure  Current Meds:    No outpatient medications have been marked as taking for the 03/17/21 encounter (Telemedicine Visit) with Starleen Blue, MD.          Allergies:    Allergies   Allergen Reactions    Flagyl [Metronidazole]     Tape              Past Surgical History:    Past Surgical History:   Procedure Laterality Date    Back/Spine surgery  06/15/1983    CARDIAC CATHETERIZATION  01/09/2021    positive for restenosis    COLONOSCOPY  07/06/2018    Repeat in 5 years.     COLONOSCOPY  04/14/2013    Normal.     ENDOSCOPY UPPER GI  2016    normal, done x2    FOOT SURGERY Left 05/14/2010    Left foot.     Oral/Dental surgery  12/12/2017    OTHER SURGICAL HISTORY  06/14/2016    heart work up, stress test negative.    WRIST FRACTURE SURGERY Left 04/14/2013    Left wrist fracture.            Family History:    Family History   Problem Relation Age of Onset    Stroke Mother         Cerebrovascular accident.     Alzheimer's disease Mother     Heart disease Father     Stroke Father         Cerebrovascular accident.     Heart disease Brother     Other Brother         Heart valve replacement.     Colon cancer Paternal Grandmother         Malignant tumor of colon.            Social History:    Social History     Tobacco Use     Smoking status: Former     Packs/day: 1.00     Years: 14.00     Pack years: 14.00     Types: Cigarettes     Quit date: 1983     Years since quitting: 39.7    Smokeless tobacco: Never    Tobacco comments:     Most recent tobacco use screening: 09/30/2017.     Vaping Use    Vaping Use: Never used   Substance Use Topics    Alcohol use: Yes     Comment: One drink or less per week.            The following sections were reviewed this encounter by the provider:            Vital Signs:    BP 128/69   Pulse 87   Temp 98 F (36.7 C)   Ht 1.626 m (5\' 4" )   Wt 72.6 kg (160 lb)   BMI 27.46 kg/m          ROS:    Review of Systems           Physical Exam:    Physical Exam   Physical exam limited due to being video visit  Physical Exam   GENERAL APPEARANCE: well developed, well nourished, no acute distress  HEAD: normal appearance, atraumatic, no swelling of face  EYES: no discharge, conjunctiva normal  EARS: normal hearing  NOSE: no nasal discharge  MOUTH: normal appearance,no perioral lesions  THROAT: normal voice  NECK/THYROID: no visual deformity or mass  LUNGS/CHEST: unlabored, speaking  in full sentences, no audible stridor, no audible wheezing, no prolonged expiratory phase, normal respiratory rate  NEURO: facial movement symmetric, normal speech, normal head movement,  PSYCH: alert and oriented, appropriate affect, appropriate mood, normal speech        Assessment:    1. Acute viral bronchitis  - albuterol sulfate HFA (PROVENTIL) 108 (90 Base) MCG/ACT inhaler; Inhale 2 puffs into the lungs every 4 (four) hours as needed for Wheezing or Shortness of Breath  Dispense: 1 each; Refill: 0    2. Coronary artery disease involving native coronary artery of native heart with angina pectoris          Plan:    Counseled on medication.  Counseled on symptomatic treatment. Follow up if symptoms persist or worsen or new arise.   Ok to take mucinex  Avoid decongestants  Good hydration        Follow-up:    No follow-ups on file.          Starleen Blue, MD

## 2021-04-02 ENCOUNTER — Other Ambulatory Visit (INDEPENDENT_AMBULATORY_CARE_PROVIDER_SITE_OTHER): Payer: Self-pay | Admitting: Family Medicine

## 2021-05-26 ENCOUNTER — Other Ambulatory Visit (INDEPENDENT_AMBULATORY_CARE_PROVIDER_SITE_OTHER): Payer: Self-pay | Admitting: Family Medicine

## 2021-05-26 DIAGNOSIS — E782 Mixed hyperlipidemia: Secondary | ICD-10-CM

## 2021-06-23 ENCOUNTER — Telehealth (INDEPENDENT_AMBULATORY_CARE_PROVIDER_SITE_OTHER): Payer: Self-pay

## 2021-06-25 ENCOUNTER — Encounter (INDEPENDENT_AMBULATORY_CARE_PROVIDER_SITE_OTHER): Payer: Self-pay | Admitting: Family Medicine

## 2021-06-25 ENCOUNTER — Telehealth (INDEPENDENT_AMBULATORY_CARE_PROVIDER_SITE_OTHER): Payer: Self-pay | Admitting: Family Medicine

## 2021-06-25 ENCOUNTER — Telehealth (INDEPENDENT_AMBULATORY_CARE_PROVIDER_SITE_OTHER): Payer: Medicare Other | Admitting: Family Medicine

## 2021-06-25 VITALS — Temp 98.6°F

## 2021-06-25 DIAGNOSIS — I25119 Atherosclerotic heart disease of native coronary artery with unspecified angina pectoris: Secondary | ICD-10-CM

## 2021-06-25 DIAGNOSIS — J069 Acute upper respiratory infection, unspecified: Secondary | ICD-10-CM

## 2021-06-25 DIAGNOSIS — N1831 Chronic kidney disease, stage 3a: Secondary | ICD-10-CM

## 2021-06-25 DIAGNOSIS — U071 COVID-19: Secondary | ICD-10-CM

## 2021-06-25 MED ORDER — NIRMATRELVIR&RITONAVIR 300/100 20 X 150 MG & 10 X 100MG PO TBPK
2.0000 | ORAL_TABLET | Freq: Two times a day (BID) | ORAL | 0 refills | Status: DC
Start: 2021-06-25 — End: 2021-06-25

## 2021-06-25 MED ORDER — NIRMATRELVIR&RITONAVIR 300/100 20 X 150 MG & 10 X 100MG PO TBPK
2.0000 | ORAL_TABLET | Freq: Two times a day (BID) | ORAL | 0 refills | Status: AC
Start: 2021-06-25 — End: 2021-06-30

## 2021-06-25 NOTE — Progress Notes (Signed)
Patient's daughter Constance Holster called and wanted to make sure patient is to stop plavix and start paxlovid.  [name] notified ok to stop plavix and start paxlovid and will follow up if needed

## 2021-06-25 NOTE — Telephone Encounter (Signed)
Pls advise pt I tried to call her. I sent paxlovid to take for 5 days. Hold off rosuvastatin for 8 days and stop plavix for 8 days

## 2021-06-25 NOTE — Telephone Encounter (Signed)
[facility] pharmacy called stating that they need directions for patients nirmatrelvir-ritonavir (PAXLOVID) 20 x 150 MG & 10 x 100MG  dose pack(emergency use authorization) please advise    Ph: [phone]

## 2021-06-25 NOTE — Telephone Encounter (Signed)
Patient aware.

## 2021-06-25 NOTE — Telephone Encounter (Signed)
Dr Jonathon Bellows  Patient contacted cardiology and was told to leave it up to PCP to prescribe COVID medication and she will have to temporarily hold off on taking blood thinners until COVID medication is complete then restart blood thinners after COVID medication is finished

## 2021-06-25 NOTE — Telephone Encounter (Signed)
Called pharmacy and there is an issue with 2 different doses on paxlovid.  Patient's daughter sent a mychart message which is sent to Dr Jonathon Bellows to address and [facility] to [facility] [address] since [facility] [address] [facility] of stock

## 2021-06-25 NOTE — Telephone Encounter (Signed)
[facility] in [place] is [address]  -wants to verify which one is needed the paxlovid renal or the paxlovid regular.    Please assist.

## 2021-06-25 NOTE — Telephone Encounter (Signed)
Called Alex and notified him that patient is to take renal dose per how prescription is ordered in EMR and recent creatinine levels

## 2021-06-25 NOTE — Telephone Encounter (Signed)
done

## 2021-06-25 NOTE — Progress Notes (Signed)
[facility] - [place]                       Date of Virtual Visit: 06/25/2021 12:29 PM        Patient ID: Christine Bridges is a 71 y.o. female.  Attending Physician: Satira Mccallum, MD       Telemedicine Eligibility:    State Location:  [x]  [place]  []  [place]  []  District of []    []  Other:    Physical Location:  [x]  Home  []         []        []          []  Other:    Patient Identity Verification:  [x]  State Issued ID  []  Insurance Eligibility Check  []  Other:    Physical Address Verification: (for 911)  [x]  Yes  []  No    Personal identity shared with patient:  [x]  Yes  []  No    Education on nature of video visit shared with patient:  [x]  Yes  []  No    Emergency plan agreed upon with patient:  [x]  Yes  []  No    If the patient had not had this virtual visit, what would they have done?  []         []         []        []          []  Other:    Visit terminated since not appropriate for virtual care:  [x]  N/A  []  Reason:         Chief Complaint:    Chief Complaint   Patient presents with    Congestion               HPI:    [age] yo F presents for positive COVID  Symptoms started on 06/23/21  She tested positive for COVID on 06/24/21  She has sinus and chest congestion  She has HA and body aches  Otc tylenol taken with no relief    Coughing from allergies  St   Runny nose for 2 days  Monday, Tuesday negative  Cough slight  No  wheeze or sob    2. Cad: on rosuvastatin daily, isosorbide and plavix              Problem List:    Patient Active Problem List   Diagnosis    GAD (generalized anxiety disorder)    Vitamin D deficiency    Fatigue, unspecified type    Mixed hyperlipidemia    Hypothyroidism    Benign essential hypertension    GERD (gastroesophageal reflux disease)    Hiatal hernia    Prediabetes    Stage 3a chronic kidney disease    On long term drug therapy    Allergic rhinitis    History of colonic polyps    Osteopenia    Palpitations    Polyp of colon    Rosacea     Overweight (BMI 25.0-29.9)    Primary osteoarthritis of both knees    Coronary artery disease involving native coronary artery of native heart without angina pectoris    Hypokalemia    Anemia    NSTEMI (non-ST elevated myocardial infarction)    Hypertensive heart disease without heart failure             Current Meds:    Outpatient Medications Marked as Taking for the 06/25/21 encounter (Telemedicine Visit) with ,  Julious Oka, MD   Medication Sig Dispense Refill    albuterol sulfate HFA (PROVENTIL) 108 (90 Base) MCG/ACT inhaler Inhale 2 puffs into the lungs every 4 (four) hours as needed for Wheezing or Shortness of Breath 1 each 0    aspirin 81 MG EC tablet Take 81 mg by mouth daily      cetirizine (ZyrTEC) 10 MG tablet every 24 hours      clopidogrel (PLAVIX) 75 mg tablet Take 75 mg by mouth      CORAL CALCIUM-MAGNESIUM-VIT D PO Take by mouth      isosorbide dinitrate (ISORDIL) 10 MG tablet Take 10 mg by mouth 3 (three) times daily Per cardiologist 60mg  daily      levothyroxine (Euthyrox) 75 MCG tablet Take 1 tablet (75 mcg total) by mouth daily 90 tablet 3    lisinopril (ZESTRIL) 5 MG tablet Take 5 mg by mouth daily      metoprolol tartrate (LOPRESSOR) 25 MG tablet 25 mg 2 (two) times daily      Multiple Vitamins-Minerals (MULTIVITAMIN ADULTS 50+ PO) multivitamin      pantoprazole (PROTONIX) 40 MG tablet Take 40 mg by mouth      rosuvastatin (CRESTOR) 20 MG tablet TAKE 1 TABLET BY MOUTH EVERY NIGHT AT BEDTIME 90 tablet 1    vitamin D3 (CHOLECALCIFEROL) 10 MCG (400 UNIT) tablet Take 2,000 Units by mouth daily            Allergies:    Allergies   Allergen Reactions    Flagyl [Metronidazole]     Tape              Past Surgical History:    Past Surgical History:   Procedure Laterality Date    Back/Spine surgery  06/15/1983    CARDIAC CATHETERIZATION  01/09/2021    positive for restenosis    COLONOSCOPY  07/06/2018    Repeat in 5 years.     COLONOSCOPY  04/14/2013    Normal.     ENDOSCOPY UPPER GI  2016    normal, done x2     FOOT SURGERY Left 05/14/2010    Left foot.     Oral/Dental surgery  12/12/2017    OTHER SURGICAL HISTORY  06/14/2016    heart work up, stress test negative.    WRIST FRACTURE SURGERY Left 04/14/2013    Left wrist fracture.            Family History:    Family History   Problem Relation Age of Onset    Stroke Mother         Cerebrovascular accident.     Alzheimer's disease Mother     Heart disease Father     Stroke Father         Cerebrovascular accident.     Heart disease Brother     Other Brother         Heart valve replacement.     Colon cancer Paternal Grandmother         Malignant tumor of colon.            Social History:    Social History     Socioeconomic History    Marital status: Single   Tobacco Use    Smoking status: Former     Packs/day: 1.00     Years: 14.00     Pack years: 14.00     Types: Cigarettes     Quit date: 1983     Years since quitting: 40.0  Smokeless tobacco: Never    Tobacco comments:     Most recent tobacco use screening: 09/30/2017.     Vaping Use    Vaping Use: Never used   Substance and Sexual Activity    Alcohol use: Yes     Comment: One drink or less per week.    Social History Narrative    Exercise: none, walking in the house            Nutrition: has allowed foodlist 06/2020    Wt was 184lbs 11/2019    Check wt daily        Live with daughter and son-inlaw    3 grandchildren        Has 2 children        2 brothers and 2 sisters        Has good support system        Has medical living will 11/18/2020                  Vital Signs:    Temp 98.6 F (37 C)          ROS:    Review of Systems           Physical Exam:    Physical Exam   GENERAL APPEARANCE: alert, in no acute distress, pleasant, well nourished.   HEAD: normal appearance  EYES: no discharge  EARS: normal hearing  NECK/THYROID: appearance -supple  PSYCH: appropriate affect, appropriate mood, normal speech, normal attention        Assessment:    1. Upper respiratory tract infection due to COVID-19 virus  -  nirmatrelvir-ritonavir (PAXLOVID) 20 x 150 MG & 10 x 100MG  dose pack(emergency use authorization); Take 2 tablets by mouth 2 (two) times daily for 5 days The dosage for PAXLOVID is 300 mg nirmatrelvir (two 150 mg tablets) with 100 mg ritonavir (one 100 mg tablet) with all three tablets taken together.  Dispense: 20 tablet; Refill: 0    2. Stage 3a chronic kidney disease  - nirmatrelvir-ritonavir (PAXLOVID) 20 x 150 MG & 10 x 100MG  dose pack(emergency use authorization); Take 2 tablets by mouth 2 (two) times daily for 5 days The dosage for PAXLOVID is 300 mg nirmatrelvir (two 150 mg tablets) with 100 mg ritonavir (one 100 mg tablet) with all three tablets taken together.  Dispense: 20 tablet; Refill: 0    3. Coronary artery disease involving native coronary artery of native heart with angina pectoris  - nirmatrelvir-ritonavir (PAXLOVID) 20 x 150 MG & 10 x 100MG  dose pack(emergency use authorization); Take 2 tablets by mouth 2 (two) times daily for 5 days The dosage for PAXLOVID is 300 mg nirmatrelvir (two 150 mg tablets) with 100 mg ritonavir (one 100 mg tablet) with all three tablets taken together.  Dispense: 20 tablet; Refill: 0            Plan:      Stop rosuvastatin for next 8 days  Stop paxlovid for 8 days    Ok to continue other medicines  Get covid booster in 3 months     Coronavirus infection (COVID-19). Most cases are mild and spontaneously resolve, but some patients are at higher risk. We follow CDC and [facility] Departments' recommendations in regard to management and control of Coronavirus, and you can stay informed by visiting their websites.    It is very important that you remain at home and physically  distance yourself from other people to avoid spreading the  infection to family, friends and your community.    - Stay at home except to get medical care - do not go to work, school, or public areas, and maintain 6 feet of separation when you have to interact with others  - Cover your coughs and  sneezes, and wear a face mask if available  - Wash your hands often  - Disinfect all frequently touched surfaces everyday  - Call our office or seek prompt medical attention if your condition worsens, you have high fevers beyond 5 days in duration or any problems breathing. Before seeking in-person care, call the facility and tell them that you are suspected of having COVID-19.    If you have had no fever for at least 72 hours, any respiratory symptoms have resolved, and at least 5 days have passed since your symptoms first appeared you may be able to come out of isolation.    I recommend you have a good working thermometer, a weight scale, a blood pressure monitor with an armcuff to monitor your vitals if possible. A pulse oximeter to monitor oxygen level would be great.     Follow up every 2-3 days if not better, short of breath          Follow-up:    Return in about 4 weeks (around 07/23/2021) for chronic care follow up, 30minutes visit, office visit, fasting.         Satira MccallumLilly Jenay Morici, MD

## 2021-06-25 NOTE — Progress Notes (Signed)
Pls advise I  e-sent TY

## 2021-06-30 ENCOUNTER — Telehealth (INDEPENDENT_AMBULATORY_CARE_PROVIDER_SITE_OTHER): Payer: Self-pay | Admitting: Family Medicine

## 2021-06-30 NOTE — Telephone Encounter (Signed)
Pls advise pt to restart plavix  and statin on 07/03/2021 to avoid drug interaction with [email or web address]

## 2021-06-30 NOTE — Telephone Encounter (Signed)
Left detailed message (ok per patient disclosure form) with notice to start plavix and statin on 07/03/21 and to call back if there are any questions

## 2021-06-30 NOTE — Telephone Encounter (Signed)
Name of Requester: Patient   Clinical Question: Patient called and expressed the concern about how long she stop taking her her blood thinner and other medications due to the Covid medication she is taking currently. Requested a call back from clinical to advise.    Last DOS: 06/25/2020  Requester's preferred ph: [phone]

## 2021-06-30 NOTE — Telephone Encounter (Signed)
Dr Jonathon Bellows   Please advise when they should restart blood thinners  Thanks  Moreen Fowler LPN

## 2021-08-11 ENCOUNTER — Telehealth (INDEPENDENT_AMBULATORY_CARE_PROVIDER_SITE_OTHER): Payer: Self-pay | Admitting: Family Medicine

## 2021-08-11 DIAGNOSIS — R928 Other abnormal and inconclusive findings on diagnostic imaging of breast: Secondary | ICD-10-CM

## 2021-08-11 NOTE — Telephone Encounter (Signed)
Patient called back and is getting repeat diagnostic mammo and radiology dept needed new order placed in Epic.  Order placed in EMR and routed to radiology

## 2021-08-11 NOTE — Progress Notes (Signed)
Hello Christine Bridges:    I have reviewed your results.    Good news normal mammogram    Take good care and practice healthy habits  Dr. Jonathon Bellows

## 2021-08-11 NOTE — Telephone Encounter (Signed)
Attempt #1  Left message for patient to call back

## 2021-08-11 NOTE — Telephone Encounter (Signed)
Patient left voice mail during lunch office hours requesting nurse to call back.

## 2021-08-11 NOTE — Addendum Note (Signed)
Addended by: Moreen Fowler on: 08/11/2021 01:40 PM     Modules accepted: Orders

## 2021-08-13 ENCOUNTER — Telehealth (INDEPENDENT_AMBULATORY_CARE_PROVIDER_SITE_OTHER): Payer: Self-pay | Admitting: Family Medicine

## 2021-08-13 ENCOUNTER — Encounter (INDEPENDENT_AMBULATORY_CARE_PROVIDER_SITE_OTHER): Payer: Self-pay | Admitting: Family Medicine

## 2021-08-13 NOTE — Telephone Encounter (Signed)
Received a request from [facility] to fax over most recent office note, lab work, stool studies and GI radiology.        Most recent office visit note: 06/25/2021  Laboratory results: no information  GI radiology: no information  Stool studies: no information    information from chart faxed to: [phone]

## 2021-08-13 NOTE — Telephone Encounter (Signed)
received confirmation/successful

## 2021-08-24 ENCOUNTER — Encounter (INDEPENDENT_AMBULATORY_CARE_PROVIDER_SITE_OTHER): Payer: Self-pay | Admitting: Family Medicine

## 2021-08-24 ENCOUNTER — Telehealth (INDEPENDENT_AMBULATORY_CARE_PROVIDER_SITE_OTHER): Payer: Medicare Other | Admitting: Family Medicine

## 2021-08-24 ENCOUNTER — Other Ambulatory Visit (INDEPENDENT_AMBULATORY_CARE_PROVIDER_SITE_OTHER): Payer: Self-pay | Admitting: Family Medicine

## 2021-08-24 VITALS — BP 113/82 | HR 95 | Ht 64.0 in | Wt 159.0 lb

## 2021-08-24 DIAGNOSIS — E782 Mixed hyperlipidemia: Secondary | ICD-10-CM

## 2021-08-24 DIAGNOSIS — D649 Anemia, unspecified: Secondary | ICD-10-CM

## 2021-08-24 DIAGNOSIS — K219 Gastro-esophageal reflux disease without esophagitis: Secondary | ICD-10-CM

## 2021-08-24 DIAGNOSIS — E039 Hypothyroidism, unspecified: Secondary | ICD-10-CM

## 2021-08-24 DIAGNOSIS — N1831 Chronic kidney disease, stage 3a: Secondary | ICD-10-CM

## 2021-08-24 DIAGNOSIS — R7303 Prediabetes: Secondary | ICD-10-CM

## 2021-08-24 NOTE — Progress Notes (Signed)
[facility] - [place]                       Date of Virtual Visit: 08/24/2021 3:00 PM        Patient ID: Christine Bridges is a 71 y.o. female.  Attending Physician: Satira Mccallum, MD       Telemedicine Eligibility:    State Location:  [x]  [place]  []  [place]  []  District of []    []  Other:    Physical Location:  [x]  Home  []         []        []          []  Other:    Patient Identity Verification:  [x]  State Issued ID  []  Insurance Eligibility Check  []  Other:    Physical Address Verification: (for 911)  [x]  Yes  []  No    Personal identity shared with patient:  [x]  Yes  []  No    Education on nature of video visit shared with patient:  [x]  Yes  []  No    Emergency plan agreed upon with patient:  [x]  Yes  []  No    If the patient had not had this virtual visit, what would they have done?  []         []         []        []          []  Other:    Visit terminated since not appropriate for virtual care:  [x]  N/A  []  Reason:         Chief Complaint:    Medication refill  Hypertension  Hyperlipidemia  Hypothyroid                HPI:    71 YO Female  Consents to VV and billing     Presents for medication refill  States compliant with medication  States occasionally has CP when going to exercise classes when heart rates goes over 130  States just finished cardiac rehab  Has an appointment scheduled for reflux on 08/27/2021 (Dr. Chad) and will be scheduling upper and lower GI  Denies swelling in lower extremities  Appointment on 08/25/2021 with hematologist  Appointment on 09/23/2021 with cardiologist    Balance problems: sway to one side more     Planning a cruise, wonders when to get shot covid            Problem List:    Patient Active Problem List   Diagnosis    GAD (generalized anxiety disorder)    Vitamin D deficiency    Fatigue, unspecified type    Mixed hyperlipidemia    Hypothyroidism    Benign essential hypertension    GERD (gastroesophageal reflux disease)    Hiatal  hernia    Prediabetes    Stage 3a chronic kidney disease    On long term drug therapy    Allergic rhinitis    History of colonic polyps    Osteopenia    Palpitations    Polyp of colon    Rosacea    Overweight (BMI 25.0-29.9)    Primary osteoarthritis of both knees    Coronary artery disease involving native coronary artery of native heart without angina pectoris    Hypokalemia    Anemia    NSTEMI (non-ST elevated myocardial infarction)    Hypertensive heart disease without heart failure  Current Meds:    Outpatient Medications Marked as Taking for the 08/24/21 encounter (Telemedicine Visit) with Satira MccallumKc, Naresh Althaus, MD   Medication Sig Dispense Refill    aspirin 81 MG EC tablet Take 81 mg by mouth daily      cetirizine (ZyrTEC) 10 MG tablet every 24 hours      clopidogrel (PLAVIX) 75 mg tablet Take 75 mg by mouth      isosorbide dinitrate (ISORDIL) 10 MG tablet Take 10 mg by mouth 3 (three) times daily Per cardiologist 60mg  daily      levothyroxine (Euthyrox) 75 MCG tablet Take 1 tablet (75 mcg total) by mouth daily 90 tablet 3    metoprolol tartrate (LOPRESSOR) 25 MG tablet 25 mg 2 (two) times daily      Multiple Vitamins-Minerals (MULTIVITAMIN ADULTS 50+ PO) multivitamin      pantoprazole (PROTONIX) 40 MG tablet Take 40 mg by mouth      rosuvastatin (CRESTOR) 20 MG tablet TAKE 1 TABLET BY MOUTH EVERY NIGHT AT BEDTIME 90 tablet 1          Allergies:    Allergies   Allergen Reactions    Flagyl [Metronidazole]     Tape              Past Surgical History:    Past Surgical History:   Procedure Laterality Date    Back/Spine surgery  06/15/1983    CARDIAC CATHETERIZATION  01/09/2021    positive for restenosis    COLONOSCOPY  07/06/2018    Repeat in 5 years.     COLONOSCOPY  04/14/2013    Normal.     ENDOSCOPY UPPER GI  2016    normal, done x2    FOOT SURGERY Left 05/14/2010    Left foot.     Oral/Dental surgery  12/12/2017    OTHER SURGICAL HISTORY  06/14/2016    heart work up, stress test negative.    WRIST FRACTURE  SURGERY Left 04/14/2013    Left wrist fracture.            Family History:    Family History   Problem Relation Age of Onset    Stroke Mother         Cerebrovascular accident.     Alzheimer's disease Mother     Heart disease Father     Stroke Father         Cerebrovascular accident.     Heart disease Brother     Other Brother         Heart valve replacement.     Colon cancer Paternal Grandmother         Malignant tumor of colon.            Social History:    Social History     Socioeconomic History    Marital status: Single   Tobacco Use    Smoking status: Former     Packs/day: 1.00     Years: 14.00     Pack years: 14.00     Types: Cigarettes     Quit date: 1983     Years since quitting: 40.2    Smokeless tobacco: Never    Tobacco comments:     Most recent tobacco use screening: 09/30/2017.     Vaping Use    Vaping Use: Never used   Substance and Sexual Activity    Alcohol use: Yes     Comment: One drink or less per week.    Social History  Narrative    Exercise: none, walking in the house            Nutrition: has allowed foodlist 06/2020    Wt was 184lbs 11/2019    Check wt daily        Live with daughter and son-inlaw    3 grandchildren        Has 2 children        2 brothers and 2 sisters        Has good support system        Has medical living will 11/18/2020                  Vital Signs:    BP 113/82   Pulse 95   Ht 1.626 m (5\' 4" )   Wt 72.1 kg (159 lb)   BMI 27.29 kg/m          ROS:    Review of Systems           Physical Exam:    Physical Exam   GENERAL APPEARANCE: alert, in no acute distress, pleasant, well nourished.   HEAD: normal appearance  EYES: no discharge  EARS: normal hearing  NECK/THYROID: appearance -supple  PSYCH: appropriate affect, appropriate mood, normal speech, normal attention        Assessment:    1. Hypothyroidism, unspecified type  - TSH; Future    2. Prediabetes  - Comprehensive metabolic panel; Future  - Hemoglobin A1C; Future    3. Stage 3a chronic kidney disease    4.  Gastroesophageal reflux disease without esophagitis    5. Anemia, unspecified type  - CBC and differential; Future    6. Mixed hyperlipidemia  - Lipid panel; Future            Plan:    Fasting lab visit soon  [place] after labs  Continue current medicines  Side effect of meds discussed  Do healthy , high fiber, low cholesterol, low carb diet  Exercise most days  Plan for wellness visit every year  Follow up visit with [name] in 3 months  Advised covid shot mid 09-2021        Follow-up:    Return in about 3 months (around 11/24/2021) for visit, AMW, office visit.         Satira Mccallum, MD

## 2021-08-25 ENCOUNTER — Other Ambulatory Visit (FREE_STANDING_LABORATORY_FACILITY): Payer: Medicare Other

## 2021-08-25 DIAGNOSIS — R7303 Prediabetes: Secondary | ICD-10-CM

## 2021-08-25 DIAGNOSIS — D649 Anemia, unspecified: Secondary | ICD-10-CM

## 2021-08-25 DIAGNOSIS — E039 Hypothyroidism, unspecified: Secondary | ICD-10-CM

## 2021-08-25 DIAGNOSIS — E782 Mixed hyperlipidemia: Secondary | ICD-10-CM

## 2021-08-25 LAB — COMPREHENSIVE METABOLIC PANEL
ALT: 30 U/L (ref 0–55)
AST (SGOT): 33 U/L (ref 5–41)
Albumin/Globulin Ratio: 1.6 (ref 0.9–2.2)
Albumin: 4.2 g/dL (ref 3.5–5.0)
Alkaline Phosphatase: 75 U/L (ref 37–117)
Anion Gap: 8 (ref 5.0–15.0)
BUN: 20 mg/dL (ref 7.0–21.0)
Bilirubin, Total: 1.4 mg/dL — ABNORMAL HIGH (ref 0.2–1.2)
CO2: 26 mEq/L (ref 17–29)
Calcium: 9.6 mg/dL (ref 7.9–10.2)
Chloride: 105 mEq/L (ref 99–111)
Creatinine: 1.1 mg/dL — ABNORMAL HIGH (ref 0.4–1.0)
Globulin: 2.7 g/dL (ref 2.0–3.6)
Glucose: 101 mg/dL — ABNORMAL HIGH (ref 70–100)
Potassium: 4.3 mEq/L (ref 3.5–5.3)
Protein, Total: 6.9 g/dL (ref 6.0–8.3)
Sodium: 139 mEq/L (ref 135–145)

## 2021-08-25 LAB — CBC AND DIFFERENTIAL
Absolute NRBC: 0 10*3/uL (ref 0.00–0.00)
Basophils Absolute Automated: 0.04 10*3/uL (ref 0.00–0.08)
Basophils Automated: 0.8 %
Eosinophils Absolute Automated: 0.25 10*3/uL (ref 0.00–0.44)
Eosinophils Automated: 5 %
Hematocrit: 39.3 % (ref 34.7–43.7)
Hgb: 12.9 g/dL (ref 11.4–14.8)
Immature Granulocytes Absolute: 0.01 10*3/uL (ref 0.00–0.07)
Immature Granulocytes: 0.2 %
Instrument Absolute Neutrophil Count: 2.57 10*3/uL (ref 1.10–6.33)
Lymphocytes Absolute Automated: 1.68 10*3/uL (ref 0.42–3.22)
Lymphocytes Automated: 33.5 %
MCH: 29.3 pg (ref 25.1–33.5)
MCHC: 32.8 g/dL (ref 31.5–35.8)
MCV: 89.1 fL (ref 78.0–96.0)
MPV: 10.6 fL (ref 8.9–12.5)
Monocytes Absolute Automated: 0.46 10*3/uL (ref 0.21–0.85)
Monocytes: 9.2 %
Neutrophils Absolute: 2.57 10*3/uL (ref 1.10–6.33)
Neutrophils: 51.3 %
Nucleated RBC: 0 /100 WBC (ref 0.0–0.0)
Platelets: 269 10*3/uL (ref 142–346)
RBC: 4.41 10*6/uL (ref 3.90–5.10)
RDW: 13 % (ref 11–15)
WBC: 5.01 10*3/uL (ref 3.10–9.50)

## 2021-08-25 LAB — LIPID PANEL
Cholesterol / HDL Ratio: 2.4 Index
Cholesterol: 161 mg/dL (ref 0–199)
HDL: 68 mg/dL (ref 40–9999)
LDL Calculated: 75 mg/dL (ref 0–99)
Triglycerides: 90 mg/dL (ref 34–149)
VLDL Calculated: 18 mg/dL (ref 10–40)

## 2021-08-25 LAB — HEMOGLOBIN A1C
Average Estimated Glucose: 108.3 mg/dL
Hemoglobin A1C: 5.4 % (ref 4.6–5.9)

## 2021-08-25 LAB — TSH: TSH: 0.7 u[IU]/mL (ref 0.35–4.94)

## 2021-08-25 LAB — GFR: EGFR: 48.8

## 2021-08-25 LAB — HEMOLYSIS INDEX: Hemolysis Index: 3 Index (ref 0–24)

## 2021-08-26 ENCOUNTER — Encounter (INDEPENDENT_AMBULATORY_CARE_PROVIDER_SITE_OTHER): Payer: Self-pay

## 2021-08-26 NOTE — Progress Notes (Signed)
Hello Ms.  Gaddie:    I have reviewed your results.    Improved sugars   Chronic kidney disease stable  Normal liver  Thyroid on target  Great cholesterol    Normal cbc now    Take good care and practice healthy habits  Dr. Jonathon Bellows

## 2021-08-26 NOTE — Telephone Encounter (Signed)
Refill sent to pharmacy.  Patient notified through MyChart.

## 2021-09-04 ENCOUNTER — Telehealth (INDEPENDENT_AMBULATORY_CARE_PROVIDER_SITE_OTHER): Payer: Self-pay | Admitting: Family Medicine

## 2021-09-04 ENCOUNTER — Encounter (INDEPENDENT_AMBULATORY_CARE_PROVIDER_SITE_OTHER): Payer: Self-pay | Admitting: Family Medicine

## 2021-09-04 NOTE — Telephone Encounter (Signed)
Received a request for the most recent lab results to be faxed    Faxed electronically through releases to [phone]    Conf/request scanned in chart

## 2021-09-09 ENCOUNTER — Encounter (INDEPENDENT_AMBULATORY_CARE_PROVIDER_SITE_OTHER): Payer: Self-pay | Admitting: Obstetrics & Gynecology

## 2021-09-09 ENCOUNTER — Ambulatory Visit (INDEPENDENT_AMBULATORY_CARE_PROVIDER_SITE_OTHER): Payer: Medicare Other | Admitting: Obstetrics & Gynecology

## 2021-09-09 VITALS — BP 143/69 | HR 90 | Temp 97.5°F | Ht 64.0 in | Wt 163.0 lb

## 2021-09-09 DIAGNOSIS — N898 Other specified noninflammatory disorders of vagina: Secondary | ICD-10-CM

## 2021-09-09 DIAGNOSIS — Z01419 Encounter for gynecological examination (general) (routine) without abnormal findings: Secondary | ICD-10-CM

## 2021-09-09 MED ORDER — CLOBETASOL PROPIONATE 0.05 % EX OINT
TOPICAL_OINTMENT | Freq: Two times a day (BID) | CUTANEOUS | 1 refills | Status: DC
Start: 2021-09-09 — End: 2021-10-29

## 2021-09-09 MED ORDER — ESTRADIOL 0.1 MG/GM VA CREA
0.4000 g | TOPICAL_CREAM | VAGINAL | 3 refills | Status: DC
Start: 2021-09-09 — End: 2021-10-29

## 2021-09-09 NOTE — Progress Notes (Signed)
Chief Complaint   Patient presents with    Gynecologic Exam     Last pap smear 10 years ago. Last mammogram 2022 - normal. Vaginal itch for several years. Pt uses vagisil and helps but the itch comes and goes.       S:    Christine Bridges is a 71 y.o. female, G2P2002, No LMP recorded. Patient is postmenopausal. Here today for annual visit.  Pt denies any PMB, abd pain, abnormal vaginal discharge/itching.  Pt denies any postmenopausal symptoms as well as urinary symptoms.     Pt has been having itching   Pt is having bowel issues       Last pap test: 10 years ago  Last mammogram: 2022  Last Dexa: 21  Last Colonoscopy: 2020    OB hx: [id number]    PMH:   Past Medical History:   Diagnosis Date    Abnormal vision     Has had an annual eye exam in 2016 and wears glasses which are new.     Chest tightness 02/21/2013    Osteoporosis     Rectal bleeding 02/21/2013             PSH:   Past Surgical History:   Procedure Laterality Date    Back/Spine surgery  06/15/1983    CARDIAC CATHETERIZATION  01/09/2021    positive for restenosis    COLONOSCOPY  07/06/2018    Repeat in 5 years.     COLONOSCOPY  04/14/2013    Normal.     ENDOSCOPY UPPER GI  2016    normal, done x2    FOOT SURGERY Left 05/14/2010    Left foot.     Oral/Dental surgery  12/12/2017    OTHER SURGICAL HISTORY  06/14/2016    heart work up, stress test negative.    WRIST FRACTURE SURGERY Left 04/14/2013    Left wrist fracture.          SH:   Social History     Socioeconomic History    Marital status: Single   Tobacco Use    Smoking status: Former     Packs/day: 1.00     Years: 14.00     Pack years: 14.00     Types: Cigarettes     Quit date: 1983     Years since quitting: 40.2    Smokeless tobacco: Never    Tobacco comments:     Most recent tobacco use screening: 09/30/2017.     Vaping Use    Vaping status: Never Used   Substance and Sexual Activity    Alcohol use: Yes     Comment: One drink or less per week.     Sexual activity: Not Currently   Social History  Narrative    Exercise: none, walking in the house            Nutrition: has allowed foodlist 06/2020    Wt was 184lbs 11/2019    Check wt daily        Live with daughter and son-inlaw    3 grandchildren        Has 2 children        2 brothers and 2 sisters        Has good support system        Has medical living will 11/18/2020         Allergies   Allergen Reactions    Flagyl [Metronidazole]     Tape  Meds:   Current Outpatient Medications   Medication Sig Dispense Refill    aspirin 81 MG EC tablet Take 81 mg by mouth daily      cetirizine (ZyrTEC) 10 MG tablet every 24 hours      clopidogrel (PLAVIX) 75 mg tablet Take 75 mg by mouth      isosorbide dinitrate (ISORDIL) 10 MG tablet Take 10 mg by mouth 3 (three) times daily Per cardiologist 60mg  daily      levothyroxine (SYNTHROID) 75 MCG tablet TAKE 1 TABLET BY MOUTH EVERY DAY 90 tablet 1    metoprolol tartrate (LOPRESSOR) 25 MG tablet 25 mg 2 (two) times daily      Multiple Vitamins-Minerals (MULTIVITAMIN ADULTS 50+ PO) multivitamin      pantoprazole (PROTONIX) 40 MG tablet Take 40 mg by mouth      rosuvastatin (CRESTOR) 20 MG tablet TAKE 1 TABLET BY MOUTH EVERY NIGHT AT BEDTIME 90 tablet 1    albuterol sulfate HFA (PROVENTIL) 108 (90 Base) MCG/ACT inhaler Inhale 2 puffs into the lungs every 4 (four) hours as needed for Wheezing or Shortness of Breath (Patient not taking: Reported on 08/24/2021) 1 each 0    clobetasol (TEMOVATE) 0.05 % ointment Apply topically 2 (two) times daily 60 g 1    estradiol (ESTRACE) 0.1 MG/GM vaginal cream Place 0.4 g vaginally twice a week 42.5 g 3    vitamin D3 (CHOLECALCIFEROL) 10 MCG (400 UNIT) tablet Take 2,000 Units by mouth daily (Patient not taking: Reported on 09/09/2021)       No current facility-administered medications for this visit.         Family History   Problem Relation Age of Onset    Stroke Mother         Cerebrovascular accident.     Alzheimer's disease Mother     Heart disease Father     Stroke Father          Cerebrovascular accident.     Heart disease Brother     Other Brother         Heart valve replacement.     Colon cancer Paternal Grandmother         Malignant tumor of colon.            Review of Systems -  General ROS: negative for fatigue, fever/chills, weight loss  Breast ROS: negative for breast lumps, nipple discharge  Gastrointestinal ROS: no abdominal pain, change in bowel habits  Genitourinary ROS: no dysuria, trouble voiding, or hematuria  Skin: denies rash or lesion  Psych: denies depression  All other systems reviewed- negative    O:    BP 143/69 (BP Site: Left arm, Patient Position: Sitting, Cuff Size: Small)   Pulse 90   Temp 97.5 F (36.4 C) (Temporal)   Ht 5\' 4"  (1.626 m)   Wt 163 lb (73.9 kg)   BMI 27.98 kg/m     General appearance - alert, well appearing, and in no distress  Breasts: breasts appear normal, symmetrical, no suspicious masses, no skin or nipple changes or axillary nodes, no nipple discharge  Abdomen - soft, nontender, nondistended, no masses  Extremities: no signs of clubbing or edema     Pelvic exam:  VULVA: atrophic normal appearing vulva with no masses, tenderness or lesions, normal clitoris  VAGINA: atrophic normal appearing vagina with normal color and discharge, no lesions,  CERVIX: normal appearing cervix without discharge or lesions,  UTERUS: uterus is normal size, shape, consistency and nontender,  anteverted  ADNEXA:  nontender and no masses.  RECTAL: normal external, no hemorrhoids        A:  1. Vaginal dryness    2. Vaginal itching    3. Routine gynecological examination            Plan    -  Well woman exam completed  - will try vaginal estrace and clobetasol cream for itching, talked about hygiene  -  Self breast exam reviewed with patient, self-breast awareness encouraged  -  Weight bearing exercise, balanced diet encouraged.  -  Next mammogram UTD  - Colonoscopy screening discussed  -  Follow up in 1-2 years or prn. No pap smears needed  - All questions answered  and AVS reviewed with patient    No orders of the defined types were placed in this encounter.          Smiley Houseman, MD

## 2021-09-10 ENCOUNTER — Ambulatory Visit (INDEPENDENT_AMBULATORY_CARE_PROVIDER_SITE_OTHER): Payer: No Typology Code available for payment source | Admitting: Obstetrics & Gynecology

## 2021-10-07 ENCOUNTER — Other Ambulatory Visit: Payer: Self-pay | Admitting: Neurology

## 2021-10-29 ENCOUNTER — Ambulatory Visit (INDEPENDENT_AMBULATORY_CARE_PROVIDER_SITE_OTHER): Payer: Medicare Other | Admitting: Family Medicine

## 2021-10-29 ENCOUNTER — Encounter (INDEPENDENT_AMBULATORY_CARE_PROVIDER_SITE_OTHER): Payer: Self-pay | Admitting: Family Medicine

## 2021-10-29 VITALS — BP 120/70 | HR 76 | Temp 98.0°F | Resp 12 | Wt 162.2 lb

## 2021-10-29 DIAGNOSIS — I2510 Atherosclerotic heart disease of native coronary artery without angina pectoris: Secondary | ICD-10-CM

## 2021-10-29 DIAGNOSIS — H6122 Impacted cerumen, left ear: Secondary | ICD-10-CM

## 2021-10-29 DIAGNOSIS — H9202 Otalgia, left ear: Secondary | ICD-10-CM

## 2021-10-29 DIAGNOSIS — R0789 Other chest pain: Secondary | ICD-10-CM

## 2021-10-29 NOTE — Progress Notes (Signed)
Subjective:      Patient ID: Christine Bridges is a 71 y.o. female.    Chief Complaint:  Chief Complaint   Patient presents with    Otalgia       HPI:  HPI  71 y/o female presents today for left ear pain started yesterday and worse this morning.  Has aching down neck and head.  Feels like something caught in throat.  Denies fevers.  Denies cough.  Has some shortness of breath.  Feels swelling in left side of neck.  Took tylenol yesterday with relief.    Had iron infusion last Tuesday    Cad: in past 2 days chest pressure worse compared to before    Gerd: upper and lower  planned 6/22  Burping makes cp feel better    No cold symptoms        Problem List:  Patient Active Problem List   Diagnosis    GAD (generalized anxiety disorder)    Vitamin D deficiency    Fatigue, unspecified type    Mixed hyperlipidemia    Hypothyroidism    Benign essential hypertension    GERD (gastroesophageal reflux disease)    Hiatal hernia    Prediabetes    Stage 3a chronic kidney disease    On long term drug therapy    Allergic rhinitis    History of colonic polyps    Osteopenia    Palpitations    Polyp of colon    Rosacea    Overweight (BMI 25.0-29.9)    Primary osteoarthritis of both knees    Coronary artery disease involving native coronary artery of native heart without angina pectoris    Hypokalemia    Anemia    NSTEMI (non-ST elevated myocardial infarction)    Hypertensive heart disease without heart failure       Current Medications:  Current Outpatient Medications   Medication Sig Dispense Refill    albuterol sulfate HFA (PROVENTIL) 108 (90 Base) MCG/ACT inhaler Inhale 2 puffs into the lungs every 4 (four) hours as needed for Wheezing or Shortness of Breath 1 each 0    aspirin 81 MG EC tablet Take 1 tablet (81 mg) by mouth daily      cetirizine (ZyrTEC) 10 MG tablet every 24 hours      clopidogrel (PLAVIX) 75 mg tablet Take 1 tablet (75 mg) by mouth      isosorbide dinitrate (ISORDIL) 10 MG tablet Take 1 tablet (10 mg) by mouth  3 (three) times daily Per cardiologist 60mg  daily      levothyroxine (SYNTHROID) 75 MCG tablet TAKE 1 TABLET BY MOUTH EVERY DAY 90 tablet 1    metoprolol tartrate (LOPRESSOR) 25 MG tablet 1 tablet (25 mg) 2 (two) times daily      Multiple Vitamins-Minerals (MULTIVITAMIN ADULTS 50+ PO) multivitamin      pantoprazole (PROTONIX) 40 MG tablet Take 1 tablet (40 mg) by mouth      rosuvastatin (CRESTOR) 20 MG tablet TAKE 1 TABLET BY MOUTH EVERY NIGHT AT BEDTIME 90 tablet 1    vitamin D3 (CHOLECALCIFEROL) 10 MCG (400 UNIT) tablet Take 5 tablets (2,000 Units) by mouth daily       No current facility-administered medications for this visit.       Allergies:  Allergies   Allergen Reactions    Flagyl [Metronidazole]     Tape        Past Medical History:  Past Medical History:   Diagnosis Date    Abnormal vision  Has had an annual eye exam in 2016 and wears glasses which are new.     Chest tightness 02/21/2013    Osteoporosis     Rectal bleeding 02/21/2013       Past Surgical History:  Past Surgical History:   Procedure Laterality Date    Back/Spine surgery  06/15/1983    CARDIAC CATHETERIZATION  01/09/2021    positive for restenosis    COLONOSCOPY  07/06/2018    Repeat in 5 years.     COLONOSCOPY  04/14/2013    Normal.     ENDOSCOPY UPPER GI  2016    normal, done x2    FOOT SURGERY Left 05/14/2010    Left foot.     Oral/Dental surgery  12/12/2017    OTHER SURGICAL HISTORY  06/14/2016    heart work up, stress test negative.    WRIST FRACTURE SURGERY Left 04/14/2013    Left wrist fracture.        Family History:  Family History   Problem Relation Age of Onset    Stroke Mother         Cerebrovascular accident.     Alzheimer's disease Mother     Heart disease Father     Stroke Father         Cerebrovascular accident.     Heart disease Brother     Other Brother         Heart valve replacement.     Colon cancer Paternal Grandmother         Malignant tumor of colon.        Social History:  Social History     Socioeconomic History     Marital status: Single   Tobacco Use    Smoking status: Former     Packs/day: 1.00     Years: 14.00     Pack years: 14.00     Types: Cigarettes     Quit date: 1983     Years since quitting: 40.4    Smokeless tobacco: Never    Tobacco comments:     Most recent tobacco use screening: 09/30/2017.     Vaping Use    Vaping status: Never Used   Substance and Sexual Activity    Alcohol use: Yes     Comment: One drink or less per week.     Sexual activity: Not Currently   Social History Narrative    Exercise: none, walking in the house            Nutrition: has allowed foodlist 06/2020    Wt was 184lbs 11/2019    Check wt daily        Live with daughter and son-inlaw    3 grandchildren        Has 2 children        2 brothers and 2 sisters        Has good support system        Has medical living will 11/18/2020        The following sections were reviewed this encounter by the provider:   Tobacco  Allergies  Meds  Problems  Med Hx  Surg Hx  Fam Hx         ROS:  Review of Systems    Vitals:  BP 120/70   Pulse 76   Temp 98 F (36.7 C)   Resp 12   Wt 73.6 kg (162 lb 3.2 oz)   BMI 27.84 kg/m  Objective:     Physical Exam:  Physical Exam  Constitutional:       General: She is not in acute distress.  HENT:      Head: Atraumatic.      Right Ear: Tympanic membrane, ear canal and external ear normal. There is no impacted cerumen.      Left Ear: Tympanic membrane normal. There is impacted cerumen.   Cardiovascular:      Rate and Rhythm: Regular rhythm.      Heart sounds: No murmur heard.  Pulmonary:      Effort: Pulmonary effort is normal.      Breath sounds: Normal breath sounds. No wheezing.   Abdominal:      Tenderness: There is no abdominal tenderness.   Musculoskeletal:         General: No swelling or tenderness.      Cervical back: Neck supple.   Neurological:      General: No focal deficit present.   Psychiatric:         Behavior: Behavior normal.          Assessment:     1. Other chest pain  - Electrocardiogram  report  - Ambulatory referral to Cardiology; Future    2. Hearing loss of left ear due to cerumen impaction    3. Left ear pain    4. Coronary artery disease involving native coronary artery of native heart without angina pectoris  - Ambulatory referral to Cardiology; Future      Plan:     Left ear cleaned and TM pearly  No signs of infection    L gum pain stable, no pain with eating  If gum pain persists see dentist  So L ear infection less likely    With chest pain worsening, see cardiologist to make sure today    Continue current meds    F/u with GI as planned    See ekg scanned: sinus rythmn with nonspecific changes      Satira Mccallum, MD

## 2021-11-25 NOTE — Progress Notes (Unsigned)
[facility] - [place]            Christine Bridges is a 72 y.o. female who presents today for the following Medicare Wellness Visit:  []  Initial Preventive Physical Exam (IPPE) - "Welcome to Medicare" preventive visit (Vision Screening required)   []  Annual Wellness Visit - Initial  [x]  Annual Wellness Visit - Subsequent     Health Risk Assessment:   During the past month, how would you rate your general health?:     Which of the following tasks can you do without assistance - drive or take the bus alone; shop for groceries or clothes; prepare your own meals; do your own housework/laundry; handle your own finances/pay bills; eat, bathe or get around your home?:    Which of the following problems have you been bothered by in the past month - dizzy when standing up; problems using the phone; feeling tired or fatigued; moderate or severe body pain?:    Do you exercise for about 20 minutes 3 or more days per week?:   During the past month was someone available to help if you needed and wanted help?  For example, if you felt nervous, lonely, got sick and had to stay in bed, needed someone to talk to, needed help with daily chores or needed help just taking care of yourself.:    Do you always wear a seat belt?:    Do you have any trouble taking medications the way you have been told to take them?:    Have you been given any information that can help you with keeping track of your medications?:    Do you have trouble paying for your medications?:    Have you been given any information that can help you with hazards in your house, such as scatter rugs, furniture, etc?:    Do you feel unsteady when standing or walking?:    Do you worry about falling?:    Have you fallen two or more times in the past year?:    Did you suffer any injuries from your falls in the past year?:       Care Team:   Patient Care Team:  Satira Mccallum, MD as PCP - General (Family Medicine)  Lonia Farber, MD as Consulting  Physician (Medical Oncology)      Hospitalizations:   Hospitalization within past year: [x]  No  []  Yes     Diagnosis:      Screenings:       11/29/2019     8:50 AM 11/18/2020    10:11 AM 11/18/2020    10:18 AM   Ambulatory Screenings   Falls Risk: Fallen more than 2 times in past year N N    Falls Risk: Suffer any injuries? N N    Depression: PHQ2 Total Score   0   Depression: PHQ9 Total Score   0        Substance Use Disorder Screen:  In the past year, how often have you used the following?  1) Alcohol (For men, 5 or more drinks a day. For women, 4 or more drinks a day)  [x]  Never []  Once or Twice []  Monthly []  Weekly []  Daily or Almost Daily  2) Tobacco Products  [x]  Never []  Once or Twice []  Monthly []  Weekly []  Daily or Almost Daily  3) Prescription Drugs for Non-Medical Reasons  [x]  Never []  Once or Twice []  Monthly []  Weekly []  Daily or Almost Daily  4) Illegal  Drugs  [x]  Never []  Once or Twice []  Monthly []  Weekly []  Daily or Almost Daily     {Alcohol Risk Questionnaire (Optional):52817}  {Opioid Misuse Screen - Addiction Behavior Checklist (Optional):52815}  {DAST10 (Optional):52816}    Functional Ability/Level of Safety:   Falls Risk/Home Safety Assessment:  ( see HRA and Screenings sections for additional assessment)  Home Safety: []  Stair handrails  []  Skid-resistant rugs/remove throw rugs   []  Grab bars  []  Clear pathways between rooms  []  Proper lighting stairs/ bathrooms/bedrooms  Get Up and Go (optional):  []   <20 secs  []   >20 secs    []   High risk for falls - Home Safety/Falls Risk Precautions reviewed with pt/family    Hearing Assessment:  Concerns for hearing loss: []  Yes  []   No  Hearing aids:   []   Right  []   Left  []   Bilateral   []   None  Whisper Test (optional):  []  Normal  []   Slightly decreased  []   Significantly decreased    Exercise:  Frequency:  []   No formal exercise  []   1-2x/wk  []   3-4x/wk  []   >4x/wk  Duration:  []   15-30 mins/day  []   30-45 mins/day  []   45+ mins/day  Intensity:  []    Light  []   Moderate  []   Heavy        Activities of Daily Living:   ADL's Independent Minimal  Assistance Moderate  Assistance Total   Assistance   Bathing [x]  []  []  []    Dressing [x]  []  []  []    Mobility   [x]  []  []  []    Transfer [x]  []  []  []    Eating [x]  []  []  []    Toileting [x]  []  []  []      IADL's Independent Minimal  Assistance Moderate  Assistance Total   Assistance   Phone [x]  []  []  []    Housekeeping [x]  []  []  []    Laundry [x]  []  []  []    Transportation [x]  []  []  []    Medications [x]  []  []  []    Finances [x]  []  []  []       ADL assistance: [x]  No assistance needed  []  Spouse  []  Sibling  []  Son   []  Daughter []  Children  []  Home Health Aide []  Other:       Advance Care Planning:   Discussion of Advance Directives:   []  Advance Directive in chart  []  Advance Directive not in chart - requested to provide []  No Advance Directive.  Form Provided  []  No Advance Directive.  Pt declines. []  Not addressed today  []  Other:     Exam:   There were no vitals taken for this visit.     Physical Exam     {Vision Screen(IPPE)/EKG Findings (IPPE)/ Additional Exam - Text (Optional):52818}     Evaluation of Cognitive Function:   Mood/affect: [x]  Appropriate  []   Other:   Appearance: [x]  Neatly groomed  [x]  Adequately nourished  []  Other:  Family member/caregiver input: []  Present - no concerns  []   Not present in room  []  Present - concerns:    Cognitive Assessment:  Mini-Cog Result (three word registration- banana, sunrise, chair / clock drawing):   [x]   > 3 points - negative screen for dementia   []  3 recalled words - negative screen for dementia   []  1-2 recalled words and normal clock draw - negative for cognitive impairment   []  1-2 recalled words and abnormal clock draw - positive for  cognitive impairment   []  0 recalled words - positive for cognitive impairment    {Additional Cognitive tests - SLUMS, MOCA, MMSE (Optional):52819}     Assessment/Plan:   There are no diagnoses linked to this encounter.    {Additional Education  and Counseling Documentation (Optional):52822}     Despina Pole, RN    11/25/2021     The following sections were reviewed this encounter by the provider:         History:   Patient Active Problem List   Diagnosis    GAD (generalized anxiety disorder)    Vitamin D deficiency    Mixed hyperlipidemia    Hypothyroidism    Benign essential hypertension    GERD (gastroesophageal reflux disease)    Hiatal hernia    Prediabetes    Stage 3a chronic kidney disease    On long term drug therapy    Allergic rhinitis    History of colonic polyps    Osteopenia    Palpitations    Polyp of colon    Rosacea    Overweight (BMI 25.0-29.9)    Primary osteoarthritis of both knees    Coronary artery disease involving native coronary artery of native heart without angina pectoris    Hypokalemia    Anemia    Hypertensive heart disease without heart failure      Past Medical History:   Diagnosis Date    Abnormal vision     Has had an annual eye exam in 2016 and wears glasses which are new.     Chest tightness 02/21/2013    Osteoporosis     Rectal bleeding 02/21/2013     Past Surgical History:   Procedure Laterality Date    Back/Spine surgery  06/15/1983    CARDIAC CATHETERIZATION  01/09/2021    positive for restenosis    COLONOSCOPY  07/06/2018    Repeat in 5 years.     COLONOSCOPY  04/14/2013    Normal.     ENDOSCOPY UPPER GI  2016    normal, done x2    FOOT SURGERY Left 05/14/2010    Left foot.     Oral/Dental surgery  12/12/2017    OTHER SURGICAL HISTORY  06/14/2016    heart work up, stress test negative.    WRIST FRACTURE SURGERY Left 04/14/2013    Left wrist fracture.      Allergies   Allergen Reactions    Flagyl [Metronidazole]     Tape       No outpatient medications have been marked as taking for the 11/26/21 encounter (Appointment) with Satira Mccallum, MD.     Social History     Tobacco Use    Smoking status: Former     Packs/day: 1.00     Years: 14.00     Pack years: 14.00     Types: Cigarettes     Quit date: 66     Years since  quitting: 40.4    Smokeless tobacco: Never    Tobacco comments:     Most recent tobacco use screening: 09/30/2017.     Vaping Use    Vaping status: Never Used   Substance Use Topics    Alcohol use: Yes     Comment: One drink or less per week.       Family History   Problem Relation Age of Onset    Stroke Mother         Cerebrovascular accident.     Alzheimer's disease Mother  Heart disease Father     Stroke Father         Cerebrovascular accident.     Heart disease Brother     Other Brother         Heart valve replacement.     Colon cancer Paternal Grandmother         Malignant tumor of colon.            ===================================================================    Additional Documentation:     {Modifier25 Separate Note Documentation (Optional):52820}       { Optional Reference (will not populate note after signing)  Modifier 25 is defined as a significant, separately identifiable, medically necessary Evaluation and Management (E/M) service by the same provider on the same day of the other service or procedure.  That portion of the visit must be medically necessary and reasonable to treat the beneficiary illness or injury.  E&M documentation must support that there has been a significant amount of additional work above and beyond what the provider would normally provide, and the visit can stand alone as a medically necessary billable service.     If the provider renders an E/M service (for example [phone]) in addition to a preventive service [phone], [phone] or [phone]),  - Provider should inform member of their potential responsibility to pay for the deductible/copay for the E/M portion of the service   - Submit the CPT code with modifier -25 along with the "G" code as part of the claims encounter submission (for example [phone] and link 13086 to modifier 25) :39607}

## 2021-11-26 ENCOUNTER — Ambulatory Visit (INDEPENDENT_AMBULATORY_CARE_PROVIDER_SITE_OTHER): Payer: Medicare Other | Admitting: Family Medicine

## 2021-11-26 ENCOUNTER — Encounter (INDEPENDENT_AMBULATORY_CARE_PROVIDER_SITE_OTHER): Payer: Self-pay | Admitting: Family Medicine

## 2021-11-26 ENCOUNTER — Telehealth (INDEPENDENT_AMBULATORY_CARE_PROVIDER_SITE_OTHER): Payer: Self-pay | Admitting: Family Medicine

## 2021-11-26 VITALS — BP 136/52 | HR 90 | Temp 98.4°F | Ht 64.0 in | Wt 163.6 lb

## 2021-11-26 DIAGNOSIS — I119 Hypertensive heart disease without heart failure: Secondary | ICD-10-CM

## 2021-11-26 DIAGNOSIS — J069 Acute upper respiratory infection, unspecified: Secondary | ICD-10-CM

## 2021-11-26 DIAGNOSIS — I350 Nonrheumatic aortic (valve) stenosis: Secondary | ICD-10-CM

## 2021-11-26 DIAGNOSIS — J208 Acute bronchitis due to other specified organisms: Secondary | ICD-10-CM

## 2021-11-26 DIAGNOSIS — Z20822 Contact with and (suspected) exposure to covid-19: Secondary | ICD-10-CM

## 2021-11-26 LAB — IHS AMB POCT SOFIA (TM) COVID-19 & FLU A/B
Sofia Influenza A Ag POCT: NEGATIVE
Sofia Influenza B Ag POCT: NEGATIVE
Sofia SARS COV2 Antigen POCT: NEGATIVE

## 2021-11-26 NOTE — Progress Notes (Signed)
[facility] - [place]                       Date of Exam: 11/26/2021 11:37 AM        Patient ID: Christine Bridges is a 71 y.o. female.  Attending Physician: Satira Mccallum, MD        Chief Complaint:    Cough  Sore Throat  HA            HPI:    71 YO Female  Presents with cough, sore throat, and HA    -COUGH  C/O dry cough since 11/25/2021. Became productive today  Pain when coughing  States some SOB and wheezing    -HA  C/O HA and body aches, except for legs since 11/25/2021  Woke up during the nigh and had difficulty going back to sleep due to aches    SORE THROAT  C/O scratchy throat yesterday and then became more sore later in the day  States throat is sore and pain goes down into chest    States has 2 grandchildren that have been sick since last week with fever    States has not taken anything for sxs.  Has not had a covid test. States grandkids were covid negative    Htn: stable on med    Hx of anemia: got iron infusion    Murmur slight louder today: hx of aortic stenosis          Problem List:    Patient Active Problem List   Diagnosis    GAD (generalized anxiety disorder)    Vitamin D deficiency    Mixed hyperlipidemia    Hypothyroidism    Benign essential hypertension    GERD (gastroesophageal reflux disease)    Hiatal hernia    Prediabetes    Stage 3a chronic kidney disease    On long term drug therapy    Allergic rhinitis    History of colonic polyps    Osteopenia    Palpitations    Polyp of colon    Rosacea    Overweight (BMI 25.0-29.9)    Primary osteoarthritis of both knees    Coronary artery disease involving native coronary artery of native heart without angina pectoris    Hypokalemia    Anemia    Hypertensive heart disease without heart failure             Current Meds:    Outpatient Medications Marked as Taking for the 11/26/21 encounter (Office Visit) with Satira Mccallum, MD   Medication Sig Dispense Refill    aspirin 81 MG EC tablet Take 1 tablet (81 mg) by mouth daily       CALCIUM PO Take by mouth      cetirizine (ZyrTEC) 10 MG tablet every 24 hours      clopidogrel (PLAVIX) 75 mg tablet Take 1 tablet (75 mg) by mouth      isosorbide dinitrate (ISORDIL) 10 MG tablet Take 1 tablet (10 mg) by mouth 3 (three) times daily Per cardiologist 60mg  daily      levothyroxine (SYNTHROID) 75 MCG tablet TAKE 1 TABLET BY MOUTH EVERY DAY 90 tablet 1    metoprolol tartrate (LOPRESSOR) 25 MG tablet 1 tablet (25 mg) 2 (two) times daily      Multiple Vitamins-Minerals (MULTIVITAMIN ADULTS 50+ PO) multivitamin      pantoprazole (PROTONIX) 40 MG tablet Take 1 tablet (40 mg) by mouth      rosuvastatin (  CRESTOR) 20 MG tablet TAKE 1 TABLET BY MOUTH EVERY NIGHT AT BEDTIME 90 tablet 1    vitamin D3 (CHOLECALCIFEROL) 10 MCG (400 UNIT) tablet Take 5 tablets (2,000 Units) by mouth daily            Allergies:    Allergies   Allergen Reactions    Flagyl [Metronidazole]     Tape              Past Surgical History:    Past Surgical History:   Procedure Laterality Date    Back/Spine surgery  06/15/1983    CARDIAC CATHETERIZATION  01/09/2021    positive for restenosis    COLONOSCOPY  07/06/2018    Repeat in 5 years.     COLONOSCOPY  04/14/2013    Normal.     ENDOSCOPY UPPER GI  2016    normal, done x2    FOOT SURGERY Left 05/14/2010    Left foot.     Oral/Dental surgery  12/12/2017    OTHER SURGICAL HISTORY  06/14/2016    heart work up, stress test negative.    WRIST FRACTURE SURGERY Left 04/14/2013    Left wrist fracture.            Family History:    Family History   Problem Relation Age of Onset    Stroke Mother         Cerebrovascular accident.     Alzheimer's disease Mother     Heart disease Father     Stroke Father         Cerebrovascular accident.     Heart disease Brother     Other Brother         Heart valve replacement.     Colon cancer Paternal Grandmother         Malignant tumor of colon.            Social History:    Social History     Tobacco Use    Smoking status: Former     Packs/day: 1.00     Years:  14.00     Pack years: 14.00     Types: Cigarettes     Quit date: 1983     Years since quitting: 40.4    Smokeless tobacco: Never    Tobacco comments:     Most recent tobacco use screening: 09/30/2017.     Vaping Use    Vaping status: Never Used   Substance Use Topics    Alcohol use: Yes     Comment: One drink or less per week.            The following sections were reviewed this encounter by the provider:            Vital Signs:    BP 136/52 (BP Site: Left arm, Patient Position: Sitting, Cuff Size: Medium)   Pulse 90   Temp 98.4 F (36.9 C)   Ht 1.626 m (5\' 4" )   Wt 74.2 kg (163 lb 9.6 oz)   SpO2 96%   BMI 28.08 kg/m          ROS:    Review of Systems           Physical Exam:    Physical Exam  Constitutional:       General: She is not in acute distress.  HENT:      Head: Atraumatic.      Right Ear: There is impacted cerumen.  Left Ear: There is impacted cerumen.      Mouth/Throat:      Pharynx: No oropharyngeal exudate or posterior oropharyngeal erythema.   Eyes:      General:         Right eye: No discharge.         Left eye: No discharge.   Cardiovascular:      Rate and Rhythm: Regular rhythm.      Heart sounds: Murmur (2/6) heard.   Pulmonary:      Effort: Pulmonary effort is normal. No respiratory distress.      Breath sounds: No stridor. Wheezing (cleared up with deeper breath) present. No rhonchi.   Abdominal:      Tenderness: There is no abdominal tenderness.   Musculoskeletal:         General: No swelling or tenderness.      Cervical back: Neck supple.   Neurological:      General: No focal deficit present.   Psychiatric:         Behavior: Behavior normal.              Assessment:    1. Acute viral bronchitis    2. Viral URI  - Sofia(TM) SARS COVID19 & Flu A/B POCT  - COVID-19 (SARS-CoV-2); Future  - COVID-19 (SARS-CoV-2)    3. Hypertensive heart disease without heart failure    4. Nonrheumatic aortic valve stenosis            Plan:       Get plenty of rest, especially while you have fever. Rest  helps your body fight infection.  Eat healthy Vitamin C rich foods daily, take a Multivitamin a day. Good nutrition helps your immune system.  Drink lots of fluids such as water, warm water with honey and lemon, clear soups. Fluids help loosen mucus and help prevent dehydration.  Gargle with warm salt water a few times a day to relieve sore throat and swelling. Throat sprays or lozenges can also relieve pain.  Avoid alcohol. and avoid secondhand smoke.  Use saline spray to moisten and open nose.  decongestant nasal sprays like Afrin can help open nose and help breathing but limit use to 3 days only.  Tylenol(acetaminophen)can be used as needed to relieve aches and pains and fever.   For 6 years and older, Robitussin DM can be used as needed to help suppress cough.  Swallowing 1/2 teaspoon of honey may help relief cough.  Carefully inhaling steam can help open up sinuses.    Follow up or call office if high prolonged fever above 100 , over 3 days, trouble breathing, changes in  mental state like confusion, severe sinus pain, ear pain, chest pressure and feeling faint.  Follow up if not better after more than 10 days     Both flu and covid test negative    Continue current other meds          Follow-up:    Return if symptoms worsen or fail to improve.         Satira Mccallum, MD

## 2021-11-26 NOTE — Telephone Encounter (Signed)
Name of Caller: Self  Clinical Question: Patient is calling other grandma that watches the grandchildren has the flu. Patient is asking was she tested for this?   Last DOS: 11-26-21  Caller's preferred ph: [phone]  Please note:

## 2021-11-27 LAB — COVID-19 (SARS-COV-2): SARS CoV 2 Overall Result: NOT DETECTED

## 2021-11-27 NOTE — Telephone Encounter (Signed)
Okay to leave detailed message per Record of Disclosure.  Left message advising patient she was tested for covid, flu A, and flu B, which were all negative.  Call office if any questions.

## 2021-11-27 NOTE — Progress Notes (Signed)
Hello Ms.  Meckes:    I have reviewed your results.    Good news covid test negative      Take good care and practice healthy habits  Dr. Jonathon Bellows

## 2021-12-03 ENCOUNTER — Telehealth (INDEPENDENT_AMBULATORY_CARE_PROVIDER_SITE_OTHER): Payer: Self-pay | Admitting: Family Medicine

## 2021-12-03 NOTE — Telephone Encounter (Signed)
Patient called stating she was seen on 11/26/2021 and she is still experiencing cough, please advise    Ph: [phone]

## 2021-12-04 ENCOUNTER — Ambulatory Visit (INDEPENDENT_AMBULATORY_CARE_PROVIDER_SITE_OTHER): Payer: Medicare Other | Admitting: Family Medicine

## 2021-12-04 ENCOUNTER — Encounter (INDEPENDENT_AMBULATORY_CARE_PROVIDER_SITE_OTHER): Payer: Self-pay | Admitting: Family Medicine

## 2021-12-04 VITALS — BP 110/70 | HR 74 | Temp 96.5°F | Resp 12 | Ht 64.0 in | Wt 164.0 lb

## 2021-12-04 DIAGNOSIS — H6123 Impacted cerumen, bilateral: Secondary | ICD-10-CM

## 2021-12-04 DIAGNOSIS — R051 Acute cough: Secondary | ICD-10-CM

## 2021-12-04 DIAGNOSIS — J069 Acute upper respiratory infection, unspecified: Secondary | ICD-10-CM

## 2021-12-04 DIAGNOSIS — I2510 Atherosclerotic heart disease of native coronary artery without angina pectoris: Secondary | ICD-10-CM

## 2021-12-04 DIAGNOSIS — I10 Essential (primary) hypertension: Secondary | ICD-10-CM

## 2021-12-04 MED ORDER — PREDNISONE 10 MG PO TABS
ORAL_TABLET | ORAL | 0 refills | Status: DC
Start: 2021-12-04 — End: 2022-02-02

## 2021-12-04 MED ORDER — PROMETHAZINE-DM 6.25-15 MG/5ML PO SYRP
5.0000 mL | ORAL_SOLUTION | Freq: Four times a day (QID) | ORAL | 0 refills | Status: DC | PRN
Start: 2021-12-04 — End: 2022-02-02

## 2021-12-04 NOTE — Telephone Encounter (Signed)
Dr. Jonathon Bellows would like to see her today at 11:15 for a VV.  Left message for patient to return call.

## 2021-12-04 NOTE — Telephone Encounter (Addendum)
Dr. Jonathon Bellows would prefer to see her in person at 11:15 today so she can listen to her lungs. If the patient can only do a virtual visit that is fine.

## 2021-12-04 NOTE — Telephone Encounter (Signed)
Left message for patient to return call.

## 2021-12-04 NOTE — Telephone Encounter (Signed)
Patient advised Dr. Jonathon Bellows would like to see her for an in office appointment today at 1:30. Patient id okay with appointment.

## 2021-12-04 NOTE — Telephone Encounter (Signed)
Patient called returning a call from [place]. Ok to call her back.

## 2021-12-04 NOTE — Progress Notes (Signed)
Subjective:      Patient ID: Christine Bridges is a 71 y.o. female.    Chief Complaint:  Chief Complaint   Patient presents with    Cough       HPI:  HPI  71 y/o female presents today for cough .  Patient reports she is improving some.  Patient has been taking mucinex with relief.  Has chest pain, and shortness of breath.  Not using inhaler as advise. Patient reports sores formed after using inhaler.    Peak flow: 270, 270, 260 predicted 330    Problem List:  Patient Active Problem List   Diagnosis    GAD (generalized anxiety disorder)    Vitamin D deficiency    Mixed hyperlipidemia    Hypothyroidism    Benign essential hypertension    GERD (gastroesophageal reflux disease)    Hiatal hernia    Prediabetes    Stage 3a chronic kidney disease    On long term drug therapy    Allergic rhinitis    History of colonic polyps    Osteopenia    Palpitations    Polyp of colon    Rosacea    Overweight (BMI 25.0-29.9)    Primary osteoarthritis of both knees    Coronary artery disease involving native coronary artery of native heart without angina pectoris    Hypokalemia    Anemia    Hypertensive heart disease without heart failure    Other long term (current) drug therapy       Current Medications:  Current Outpatient Medications   Medication Sig Dispense Refill    aspirin 81 MG EC tablet Take 1 tablet (81 mg) by mouth daily      CALCIUM PO Take by mouth      cetirizine (ZyrTEC) 10 MG tablet every 24 hours      clopidogrel (PLAVIX) 75 mg tablet Take 1 tablet (75 mg) by mouth      isosorbide dinitrate (ISORDIL) 10 MG tablet Take 1 tablet (10 mg) by mouth 3 (three) times daily Per cardiologist 60mg  daily      levothyroxine (SYNTHROID) 75 MCG tablet TAKE 1 TABLET BY MOUTH EVERY DAY 90 tablet 1    metoprolol tartrate (LOPRESSOR) 25 MG tablet 1 tablet (25 mg) 2 (two) times daily      Multiple Vitamins-Minerals (MULTIVITAMIN ADULTS 50+ PO) multivitamin      pantoprazole (PROTONIX) 40 MG tablet Take 1 tablet (40 mg) by mouth       rosuvastatin (CRESTOR) 20 MG tablet TAKE 1 TABLET BY MOUTH EVERY NIGHT AT BEDTIME 90 tablet 1    vitamin D3 (CHOLECALCIFEROL) 10 MCG (400 UNIT) tablet Take 5 tablets (2,000 Units) by mouth daily      predniSONE (DELTASONE) 10 MG tablet 4 tabs daily for 2d, 3tabs daily for 2d, 2 tabs daily for 2d and 1 tab daily for 2d 20 tablet 0    promethazine-dextromethorphan (PROMETHAZINE-DM) 6.25-15 MG/5ML syrup Take 5 mLs by mouth 4 (four) times daily as needed for Cough 180 mL 0     No current facility-administered medications for this visit.       Allergies:  Allergies   Allergen Reactions    Flagyl [Metronidazole]     Tape        Past Medical History:  Past Medical History:   Diagnosis Date    Abnormal vision     Has had an annual eye exam in 2016 and wears glasses which are new.     Chest  tightness 02/21/2013    Osteoporosis     Rectal bleeding 02/21/2013       Past Surgical History:  Past Surgical History:   Procedure Laterality Date    Back/Spine surgery  06/15/1983    CARDIAC CATHETERIZATION  01/09/2021    positive for restenosis    COLONOSCOPY  07/06/2018    Repeat in 5 years.     COLONOSCOPY  04/14/2013    Normal.     ENDOSCOPY UPPER GI  2016    normal, done x2    FOOT SURGERY Left 05/14/2010    Left foot.     Oral/Dental surgery  12/12/2017    OTHER SURGICAL HISTORY  06/14/2016    heart work up, stress test negative.    WRIST FRACTURE SURGERY Left 04/14/2013    Left wrist fracture.        Family History:  Family History   Problem Relation Age of Onset    Stroke Mother         Cerebrovascular accident.     Alzheimer's disease Mother     Heart disease Father     Stroke Father         Cerebrovascular accident.     Heart disease Brother     Other Brother         Heart valve replacement.     Colon cancer Paternal Grandmother         Malignant tumor of colon.        Social History:  Social History     Socioeconomic History    Marital status: Single   Tobacco Use    Smoking status: Former     Packs/day: 1.00     Years: 14.00      Total pack years: 14.00     Types: Cigarettes     Quit date: 281983     Years since quitting: 40.5    Smokeless tobacco: Never    Tobacco comments:     Most recent tobacco use screening: 09/30/2017.     Vaping Use    Vaping Use: Never used   Substance and Sexual Activity    Alcohol use: Yes     Comment: One drink or less per week.     Sexual activity: Not Currently   Social History Narrative    Exercise: none, walking in the house            Nutrition: has allowed foodlist 06/2020    Wt was 184lbs 11/2019    Check wt daily        Live with daughter and son-inlaw    3 grandchildren        Has 2 children        2 brothers and 2 sisters        Has good support system        Has medical living will 11/18/2020        The following sections were reviewed this encounter by the provider:        ROS:  Review of Systems  No sinus pain, no ear pain  Vitals:  BP 110/70   Pulse 74   Temp (!) 96.5 F (35.8 C)   Resp 12   Ht 1.626 m (5\' 4" )   Wt 74.4 kg (164 lb)   BMI 28.15 kg/m      Objective:     Physical Exam:  Physical Exam  Constitutional:       General: She is not in acute  distress.  HENT:      Head: Atraumatic.      Right Ear: There is impacted cerumen.      Left Ear: There is impacted cerumen.      Mouth/Throat:      Pharynx: No oropharyngeal exudate or posterior oropharyngeal erythema.   Cardiovascular:      Rate and Rhythm: Regular rhythm.      Heart sounds: No murmur heard.  Pulmonary:      Effort: Pulmonary effort is normal.      Breath sounds: Normal breath sounds. No wheezing.   Abdominal:      Tenderness: There is no abdominal tenderness.   Musculoskeletal:         General: No swelling or tenderness.      Cervical back: Neck supple.   Neurological:      General: No focal deficit present.   Psychiatric:         Behavior: Behavior normal.          Assessment:     1. Acute cough  - promethazine-dextromethorphan (PROMETHAZINE-DM) 6.25-15 MG/5ML syrup; Take 5 mLs by mouth 4 (four) times daily as needed for Cough   Dispense: 180 mL; Refill: 0  - predniSONE (DELTASONE) 10 MG tablet; 4 tabs daily for 2d, 3tabs daily for 2d, 2 tabs daily for 2d and 1 tab daily for 2d  Dispense: 20 tablet; Refill: 0    2. Viral URI    3. Coronary artery disease involving native coronary artery of native heart without angina pectoris    4. Benign essential hypertension    5. Bilateral impacted cerumen      Plan:     Side effects of meds discussed. Return to office if symptoms persisting or worsening.    Continue current other meds    F/u with cardiology for stress test as planned      Satira Mccallum, MD

## 2021-12-04 NOTE — Telephone Encounter (Signed)
Discussed with Marcelino Duster, give visit  today

## 2021-12-04 NOTE — Telephone Encounter (Signed)
Please advise  Patient was seen on 11/26/2021 for cough and ST. States is still having a productive cough on and off. Denies ST or fever. States tried Robitussin DM without relief. Is using some type of tablet medication with a little improvement. Does not know what the medication is. She is going out of town to [place] on 12/08/2021

## 2022-01-21 HISTORY — PX: COLONOSCOPY W/ BIOPSIES: SHX1374

## 2022-01-21 HISTORY — PX: EGD, COLONOSCOPY: SHX3799

## 2022-02-02 ENCOUNTER — Encounter (INDEPENDENT_AMBULATORY_CARE_PROVIDER_SITE_OTHER): Payer: Self-pay | Admitting: Family Medicine

## 2022-02-02 ENCOUNTER — Ambulatory Visit (INDEPENDENT_AMBULATORY_CARE_PROVIDER_SITE_OTHER): Payer: Medicare Other | Admitting: Family Medicine

## 2022-02-02 VITALS — BP 110/62 | HR 74 | Temp 97.2°F | Resp 12 | Ht 64.0 in | Wt 164.6 lb

## 2022-02-02 DIAGNOSIS — Z0001 Encounter for general adult medical examination with abnormal findings: Secondary | ICD-10-CM

## 2022-02-02 DIAGNOSIS — H6123 Impacted cerumen, bilateral: Secondary | ICD-10-CM

## 2022-02-02 DIAGNOSIS — N1831 Chronic kidney disease, stage 3a: Secondary | ICD-10-CM

## 2022-02-02 DIAGNOSIS — K449 Diaphragmatic hernia without obstruction or gangrene: Secondary | ICD-10-CM

## 2022-02-02 DIAGNOSIS — I2510 Atherosclerotic heart disease of native coronary artery without angina pectoris: Secondary | ICD-10-CM

## 2022-02-02 DIAGNOSIS — D649 Anemia, unspecified: Secondary | ICD-10-CM

## 2022-02-02 DIAGNOSIS — E782 Mixed hyperlipidemia: Secondary | ICD-10-CM

## 2022-02-02 NOTE — Progress Notes (Signed)
Christine Bridges is a 71 y.o. female who presents today for a Medicare Annual Wellness Visit.   Last mammogram- 08/11/2021  Last colonoscopy- 01/21/2022  Dexa scan-  05/07/2020  Colonoscpy clear     Endoscopy showed large hernia    Cad: just got stent last Friday and feeling ok        Health Risk Assessment     During the past month, how would you rate your general health?:  Fair  Which of the following tasks can you do without assistance - drive or take the bus alone; shop for groceries or clothes; prepare your own meals; do your own housework/laundry; handle your own finances/pay bills; eat, bathe or get around your home?:  Drive or take the bus alone, Prepare your own meals, Handle your own finances/pay bills, Shop for groceries or clothes, Do your own housework/laundry, Eat, bathe, dress or get around your home  Which of the following problems have you been bothered by in the past month - dizzy when standing up; problems using the phone; feeling tired or fatigued; moderate or severe body pain?: Feeling tired or fatigued, Moderate or severe body pain  Do you exercise for about 20 minutes 3 or more days per week?:  Yes  During the past month was someone available to help if you needed and wanted help?  For example, if you felt nervous, lonely, got sick and had to stay in bed, needed someone to talk to, needed help with daily chores or needed help just taking care of yourself.: Yes  Do you always wear a seat belt?: Yes  Do you have any trouble taking medications the way you have been told to take them?: No  Have you been given any information that can help you with keeping track of your medications?: No  Do you have trouble paying for your medications?: No  Have you been given any information that can help you with hazards in your house, such as scatter rugs, furniture, etc?: No  Do you feel unsteady when standing or walking?: Yes  Do you worry about falling?: Yes  Have you fallen two or more times in the past  year?: No  Did you suffer any injuries from your falls in the past year?: No    Additional Concerns    Patient Care Team:  Satira Mccallum, MD as PCP - General (Family Medicine)  Lonia Farber, MD as Consulting Physician (Medical Oncology)    Past Medical History:   Diagnosis Date    Abnormal vision     Has had an annual eye exam in 2016 and wears glasses which are new.     Chest tightness 02/21/2013    Osteoporosis     Rectal bleeding 02/21/2013     Past Surgical History:   Procedure Laterality Date    Back/Spine surgery  06/15/1983    CARDIAC CATHETERIZATION  01/09/2021    positive for restenosis    CAROTID STENT      COLONOSCOPY  07/06/2018    Repeat in 5 years.     COLONOSCOPY  04/14/2013    Normal.     ENDOSCOPY UPPER GI  2016    normal, done x2    FOOT SURGERY Left 05/14/2010    Left foot.     Oral/Dental surgery  12/12/2017    OTHER SURGICAL HISTORY  06/14/2016    heart work up, stress test negative.    WRIST FRACTURE SURGERY Left 04/14/2013    Left wrist fracture.  Allergies   Allergen Reactions    Albuterol Other (See Comments)     Tongue burning    Flagyl [Metronidazole]     Tape       Current Outpatient Medications   Medication Sig Dispense Refill    aspirin 81 MG EC tablet Take 1 tablet (81 mg) by mouth daily      CALCIUM PO Take by mouth      cetirizine (ZyrTEC) 10 MG tablet every 24 hours      clopidogrel (PLAVIX) 75 mg tablet Take 1 tablet (75 mg) by mouth      isosorbide dinitrate (ISORDIL) 10 MG tablet Take 1 tablet (10 mg) by mouth 3 (three) times daily Per cardiologist 60mg  daily      levothyroxine (SYNTHROID) 75 MCG tablet TAKE 1 TABLET BY MOUTH EVERY DAY 90 tablet 1    metoprolol tartrate (LOPRESSOR) 25 MG tablet 1 tablet (25 mg) 2 (two) times daily      Multiple Vitamins-Minerals (MULTIVITAMIN ADULTS 50+ PO) multivitamin      pantoprazole (PROTONIX) 40 MG tablet Take 1 tablet (40 mg) by mouth      rosuvastatin (CRESTOR) 20 MG tablet TAKE 1 TABLET BY MOUTH EVERY NIGHT AT BEDTIME 90 tablet 1     No  current facility-administered medications for this visit.      Social History     Tobacco Use    Smoking status: Former     Packs/day: 1.00     Years: 14.00     Additional pack years: 0.00     Total pack years: 14.00     Types: Cigarettes     Quit date: 19     Years since quitting: 40.6    Smokeless tobacco: Never    Tobacco comments:     Most recent tobacco use screening: 09/30/2017.     Vaping Use    Vaping Use: Never used   Substance Use Topics    Alcohol use: Yes     Comment: One drink or less per week.     Drug use: Not Currently      Family History   Problem Relation Age of Onset    Stroke Mother         Cerebrovascular accident.     Alzheimer's disease Mother     Heart disease Father     Stroke Father         Cerebrovascular accident.     Heart disease Brother     Other Brother         Heart valve replacement.     Colon cancer Paternal Grandmother         Malignant tumor of colon.          The following sections were reviewed this encounter by the provider:   Tobacco  Allergies  Meds  Problems  Med Hx  Surg Hx  Fam Hx         Hospitalizations  no hospitalizations within past year    Depression Screening    See related Activity or Flowsheet    Functional Ability    Falls Risk:  home does not have throw rugs, poor lighting or a slippery bath tub or shower  Hearing:  hearing slightly decreased  Exercise:  exercises 3-4x/week and 30-60 minutes per day  ADL's:   Bathing - independent   Dressing - independent   Mobility - independent   Transfer - independent   Eating - independent}   Toileting - independent  ADL assistance not needed    Discussion of Advance Directives: Has an Advanced Directive. A copy has not been provided. Requested to provide.     Assessment    BP 110/62   Pulse 74   Temp 97.2 F (36.2 C)   Resp 12   Ht 1.626 m (5\' 4" )   Wt 74.7 kg (164 lb 9.6 oz)   BMI 28.25 kg/m                Evaluation of Cognitive Function    Mood/affect: Appropriate  Appearance: neatly groomed,  appropriately and adequately nourished  Family member/caregiver input: Not present        AWV Mini-Cog Result:  3 recalled words - negative screen for dementia    Assessment/Plan    1. Encounter for routine adult health examination with abnormal findings    2. Bilateral hearing loss due to cerumen impaction  - Ambulatory referral to ENT; Future    3. Anemia, unspecified type  - CBC and differential; Future  - Vitamin B12    4. Stage 3a chronic kidney disease  - Comprehensive metabolic panel; Future    5. Coronary artery disease involving native coronary artery of native heart without angina pectoris  - Lipid panel; Future    6. Mixed hyperlipidemia  - Lipid panel; Future    7. Hiatal hernia          Satira Mccallum, MD  02/02/2022

## 2022-02-02 NOTE — Patient Instructions (Signed)
MEDICARE WELLNESS PERSONAL PREVENTION PLAN   As part of the Medicare Wellness portion of your visit today, we are providing you with this personalized preventative plan of care. We have listed below some of the preventative services that are recommended for patients based upon their age and gender. These recommendations are taken directly from the [place] [employer] (USPSTF) and the [employer] (ACIP).     Health Maintenance   Topic Date Due    Advance Directive on File  Has one    Shingrix Vaccine 50+ (2) 01/24/2020, advised    Tetanus Ten-Year  03/14/2021, advised to get    INFLUENZA VACCINE  01/12/2022    Statin Use  12/23/2022    FALLS RISK ANNUAL  02/03/2023    DEPRESSION SCREENING  02/03/2023    Medicare Annual Wellness Visit  02/03/2023    Colorectal Cancer Screening  07/07/2023    MAMMOGRAM  08/12/2023    HEPATITIS C SCREENING  Completed    DXA Scan  Completed    COVID-19 Vaccine  Completed    Pneumonia Vaccine Age 72+  Completed            Immunization History   Administered Date(s) Administered    COVID-19 mRNA BIVALENT vaccine 12 years and above (Moderna) 50 mcg/0.5 mL 09/24/2021    COVID-19 mRNA MONOVALENT vaccine PRIMARY SERIES 12 years and above (Moderna) 100 mcg/0.5 mL 07/16/2019, 07/19/2019, 08/13/2019, 08/16/2019, 05/02/2020, 02/04/2021    DTaP 03/15/2011    INFLUENZA HIGH DOSE 65 YRS+ 04/29/2016, 04/24/2017, 03/30/2018, 03/21/2019, 04/14/2020    INFLUENZA HIGH DOSE 65 YRS+ Quad 0.7 mL 03/30/2020, 04/08/2021    INFLUENZA QUAD (FLULAVAL ONLY) 6 MOS & GREATER PRESERVED 04/14/2016, 03/14/2018    INFLUENZA QUADRIVALENT ADJUVANTED PF 65 YRS & OLDER (FLUAD) 03/21/2019    Influenza (Im) Preservative Free 02/13/2015    Influenza Vacc QUAD Recombinant PF 92yrs & up 03/07/2015, 03/14/2018    Influenza quadrivalent (IM) 6 months & up PRESERVED (Afluria/Fluzone) 04/14/2016, 03/14/2018    Influenza quadrivalent (IM) PF 3 Yrs & greater 03/07/2015,  03/14/2018    Pneumococcal 23 valent 04/14/2016    Pneumococcal Conjugate 13-Valent 04/29/2016    Tdap 06/14/2010, 03/15/2011        Your major risk factors:   Recommendations for improvement:      The list below includes many common screening recommendations but is not meant to be comprehensive. You may be eligible for other preventative services depending upon your personal risk factors.   Colorectal Cancer Screening - All adults age 75-75 should undergo periodic colorectal cancer screening. The decision to screen for colorectal cancer in adults aged 56 to 26 years should be an individual one,taking into account your overall health and prior screening history.   Breast Cancer Screening - Women age 35-74 should have mammograms every other year (please note that this recommendation may not be appropriate for every woman - your physician can answer specific questions you may have). The USPSTF concludes that the current evidence is insufficient to assess the balance of benefits and harms of screening mammography in women aged 95 years or older.    Cervical Cancer Screening - Women over 3 do not require pap smears as long as prior screening has been normal and are not otherwise at high risk for cervical cancer. For women aged 17 to 71 years, the USPSTF recommends screening every 3 years with cervical cytology alone, every 5 years with high-risk human papillomavirus (hrHPV) testing alone, or every 5  years with hrHPV testing in combination with cytology (cotesting).   Osteoporosis Screening -  The USPSTF recommends screening for osteoporosis with bone measurement testing to prevent osteoporotic fractures in women 65 years and older.  Hepatitis C Screening - Recommend screening for hepatitis C virus (HCV) infection in all adults aged 39 to 21 years.  Lung cancer Screening - Recommend annual screening for lung cancer with low-dose computed tomography (LDCT) in adults ages 16 to 58 years who have a 20 pack-year smoking  history and currently smoke or have quit within the past 15 years.  Recommended Vaccinations   Influenza one dose annually   Tetanus/diphtheria one booster every 10 years   Zoster/Shingles - Shingrix two doses after age 40 (second dose given 2-6 months after first dose)  Pneumovax (PPSV23) one dose for adults aged 72 years  Prevnar(PCV13) shared clinical decision-making is recommended regarding administration of this vaccine to persons aged ?72 years who do not have an immunocompromising condition, cerebrospinal fluid leak, or cochlear implant.     PERSONAL PREVENTION PLAN   Your Personal Prevention Plan is based on your overall health and your responses to the health questionnaire you completed. The following information is for you to review in addition to the recommendations, referrals, and tests we have discussed at your visit.     Physical Activity:   Physical activity can help you maintain a healthy weight, prevent or control illness, reduce stress, and sleep better. It can also help you improve your balance to avoid falls. Try to build up to and maintain a total of 30 minutes of activity each day. If you are able, try walking, doing yard or housework, and taking the stairs more often. You can also strengthen your muscles with exercises done while sitting or lying down.   Emotional Health:   Feeling "down in the dumps" or anxious every now and then is a natural part of life. If this feeling lasts for a few weeks or more, talk with me as soon as possible. It could be a sign of a problem that needs treatment. There are many types of treatment available.   Falls:   You can reduce your risk of falling by making changes in your home. Remove items that may cause tripping, improve lighting, and consider installing grab bars.   Talk with me if you have problems with balance and walking. To prevent falls, you may need your vision, hearing, or blood pressure checked. Exercises to improve your strength and balance, or  using a cane or walker, may help. Review your medicines with me at every visit, because some can affect balance. Please be sure to let me know if you fall or are fearful you may fall.   Urinary Leakage:   Urine leakage is common, but it is not a normal part of aging. Talk with me about any urine leakage so that the cause can be found and treated. Treatment can include bladder training, exercises, medicine or surgery.   Pain:   We all have aches and pains at times, but chronic pain can change how you feel and live every day. Please talk with me about any symptoms of chronic pain so that we can determine how best to treat.   Sleep:   Getting a good night's sleep is vital to your health and well-being and can help prevent or manage health problems. Often, sleep can be improved by changing behaviors, including when you go to bed and what you do before bed. Sleep  apnea can cause problems such as struggling to stay awake during the day. Please let me know if you would like to learn more about improving your sleep and/or think you may have sleep apnea.   Seat Belt:   Please remember to wear a seat belt when driving or riding in a vehicle. It is one of the most important things you can do to stay safe in a car.   Nutrition:   Remember to eat plenty of fruits, vegetables, whole grains, and dairy. Drink at least 64 ounces (8 full glasses) of water a day, unless you have been advised to limit fluids.   Alcohol:   Alcohol can have a greater effect on older people, who may feel its effects at a lower amount. Older people should limit alcoholic drinks (no more than one a day for women and no more than two a day for men). Please let me know if alcohol use becomes a problem.   Tobacco:   Not smoking or using other forms of tobacco is one of the most important things you can do for your health. Here is some more information about the importance of quitting smoking and how to quit smoking -  [place]  Advance Directives:   There may come a time when medical decisions need to be made on your behalf. Please talk with your family, and with me, about your wishes. It is important to provide information about your decisions, and any formal advance directives, for your medical record. Here is additional information on advanced directives - BlindCheck.com.ee.html  Additional Support:   Sometimes it can be challenging to manage all aspects of daily life. Finding the right support can help you maintain or improve your health and independence. Please let me know if you would like to talk further about finding resources to assist you.

## 2022-02-06 ENCOUNTER — Other Ambulatory Visit (INDEPENDENT_AMBULATORY_CARE_PROVIDER_SITE_OTHER): Payer: Self-pay | Admitting: Family Medicine

## 2022-02-06 DIAGNOSIS — E039 Hypothyroidism, unspecified: Secondary | ICD-10-CM

## 2022-02-10 ENCOUNTER — Other Ambulatory Visit (FREE_STANDING_LABORATORY_FACILITY): Payer: Medicare Other

## 2022-02-10 ENCOUNTER — Telehealth (INDEPENDENT_AMBULATORY_CARE_PROVIDER_SITE_OTHER): Payer: Self-pay | Admitting: Family Medicine

## 2022-02-10 DIAGNOSIS — I2510 Atherosclerotic heart disease of native coronary artery without angina pectoris: Secondary | ICD-10-CM

## 2022-02-10 DIAGNOSIS — E782 Mixed hyperlipidemia: Secondary | ICD-10-CM

## 2022-02-10 DIAGNOSIS — N1831 Chronic kidney disease, stage 3a: Secondary | ICD-10-CM

## 2022-02-10 DIAGNOSIS — D649 Anemia, unspecified: Secondary | ICD-10-CM

## 2022-02-10 LAB — CBC AND DIFFERENTIAL
Absolute NRBC: 0 10*3/uL (ref 0.00–0.00)
Basophils Absolute Automated: 0.04 10*3/uL (ref 0.00–0.08)
Basophils Automated: 0.8 %
Eosinophils Absolute Automated: 0.17 10*3/uL (ref 0.00–0.44)
Eosinophils Automated: 3.3 %
Hematocrit: 39.8 % (ref 34.7–43.7)
Hgb: 13.6 g/dL (ref 11.4–14.8)
Immature Granulocytes Absolute: 0.01 10*3/uL (ref 0.00–0.07)
Immature Granulocytes: 0.2 %
Instrument Absolute Neutrophil Count: 2.7 10*3/uL (ref 1.10–6.33)
Lymphocytes Absolute Automated: 1.65 10*3/uL (ref 0.42–3.22)
Lymphocytes Automated: 32.3 %
MCH: 30.3 pg (ref 25.1–33.5)
MCHC: 34.2 g/dL (ref 31.5–35.8)
MCV: 88.6 fL (ref 78.0–96.0)
MPV: 10.1 fL (ref 8.9–12.5)
Monocytes Absolute Automated: 0.54 10*3/uL (ref 0.21–0.85)
Monocytes: 10.6 %
Neutrophils Absolute: 2.7 10*3/uL (ref 1.10–6.33)
Neutrophils: 52.8 %
Nucleated RBC: 0 /100 WBC (ref 0.0–0.0)
Platelets: 281 10*3/uL (ref 142–346)
RBC: 4.49 10*6/uL (ref 3.90–5.10)
RDW: 13 % (ref 11–15)
WBC: 5.11 10*3/uL (ref 3.10–9.50)

## 2022-02-10 LAB — COMPREHENSIVE METABOLIC PANEL
ALT: 33 U/L (ref 0–55)
AST (SGOT): 29 U/L (ref 5–41)
Albumin/Globulin Ratio: 1.4 (ref 0.9–2.2)
Albumin: 4 g/dL (ref 3.5–5.0)
Alkaline Phosphatase: 70 U/L (ref 37–117)
Anion Gap: 7 (ref 5.0–15.0)
BUN: 17 mg/dL (ref 7.0–21.0)
Bilirubin, Total: 1.4 mg/dL — ABNORMAL HIGH (ref 0.2–1.2)
CO2: 26 mEq/L (ref 17–29)
Calcium: 9.6 mg/dL (ref 7.9–10.2)
Chloride: 104 mEq/L (ref 99–111)
Creatinine: 1 mg/dL (ref 0.4–1.0)
Globulin: 2.9 g/dL (ref 2.0–3.6)
Glucose: 101 mg/dL — ABNORMAL HIGH (ref 70–100)
Potassium: 5.1 mEq/L (ref 3.5–5.3)
Protein, Total: 6.9 g/dL (ref 6.0–8.3)
Sodium: 137 mEq/L (ref 135–145)
eGFR: 59.6 mL/min/{1.73_m2} — AB (ref >=60)

## 2022-02-10 LAB — LIPID PANEL
Cholesterol / HDL Ratio: 2.4 Index
Cholesterol: 149 mg/dL (ref 0–199)
HDL: 62 mg/dL (ref 40–9999)
LDL Calculated: 68 mg/dL (ref 0–99)
Triglycerides: 94 mg/dL (ref 34–149)
VLDL Calculated: 19 mg/dL (ref 10–40)

## 2022-02-10 LAB — HEMOLYSIS INDEX(SOFT): Hemolysis Index: 7 Index (ref 0–24)

## 2022-02-10 NOTE — Telephone Encounter (Signed)
The phlebotomist Aura was asking if we received outside orders from [facility] for Jacquline    Received/printed and gave to Aura and scanned in chart

## 2022-02-12 ENCOUNTER — Other Ambulatory Visit (INDEPENDENT_AMBULATORY_CARE_PROVIDER_SITE_OTHER): Payer: Self-pay | Admitting: Family Medicine

## 2022-02-12 DIAGNOSIS — E782 Mixed hyperlipidemia: Secondary | ICD-10-CM

## 2022-02-12 MED ORDER — ROSUVASTATIN CALCIUM 20 MG PO TABS
20.0000 mg | ORAL_TABLET | Freq: Every evening | ORAL | 3 refills | Status: DC
Start: 2022-02-12 — End: 2023-05-06

## 2022-03-12 ENCOUNTER — Other Ambulatory Visit (INDEPENDENT_AMBULATORY_CARE_PROVIDER_SITE_OTHER): Payer: Self-pay | Admitting: Family Medicine

## 2022-04-07 ENCOUNTER — Other Ambulatory Visit (INDEPENDENT_AMBULATORY_CARE_PROVIDER_SITE_OTHER): Payer: Self-pay | Admitting: Family Medicine

## 2022-04-27 ENCOUNTER — Other Ambulatory Visit (INDEPENDENT_AMBULATORY_CARE_PROVIDER_SITE_OTHER): Payer: Self-pay | Admitting: Family Medicine

## 2022-04-27 NOTE — Telephone Encounter (Signed)
Refill Request:    Last Office Visit (Less than 6 months): 02/02/22  (If greater than 6 months- NEEDS OV SCHEDULED)    Prescriber: Provider, Historical MD  Medication:   Name: pantoprazole (PROTONIX) 40 MG tablet      How many days left of meds: 0    Pharmacy:   [id number] [address] [place], [place] - [address] Phone: [phone]   Fax: [phone]        Refills needed: yes    Medication Questions/Comments:     (If this case is regarding a prior auth for a medication, please ask if there is a prior auth phone number that was provided and add it under Comments.)

## 2022-04-29 ENCOUNTER — Encounter (INDEPENDENT_AMBULATORY_CARE_PROVIDER_SITE_OTHER): Payer: Self-pay | Admitting: Family Medicine

## 2022-04-29 MED ORDER — PANTOPRAZOLE SODIUM 40 MG PO TBEC
40.0000 mg | DELAYED_RELEASE_TABLET | Freq: Every day | ORAL | 3 refills | Status: DC
Start: 2022-04-29 — End: 2023-08-25

## 2022-04-29 NOTE — Telephone Encounter (Signed)
Please advise 

## 2022-04-29 NOTE — Telephone Encounter (Signed)
Please advise if okay. Last filled a year ago.

## 2022-04-30 NOTE — Telephone Encounter (Signed)
Okay to leave detailed message per Record of Disclosure.  Left message advising prescription has been sent to her pharmacy.

## 2022-07-21 ENCOUNTER — Encounter (INDEPENDENT_AMBULATORY_CARE_PROVIDER_SITE_OTHER): Payer: Self-pay

## 2022-07-23 ENCOUNTER — Telehealth (INDEPENDENT_AMBULATORY_CARE_PROVIDER_SITE_OTHER): Payer: Self-pay

## 2022-08-06 DIAGNOSIS — D539 Nutritional anemia, unspecified: Secondary | ICD-10-CM | POA: Insufficient documentation

## 2022-08-06 DIAGNOSIS — M1990 Unspecified osteoarthritis, unspecified site: Secondary | ICD-10-CM | POA: Insufficient documentation

## 2022-08-06 DIAGNOSIS — E7800 Pure hypercholesterolemia, unspecified: Secondary | ICD-10-CM | POA: Insufficient documentation

## 2022-08-06 DIAGNOSIS — Z955 Presence of coronary angioplasty implant and graft: Secondary | ICD-10-CM | POA: Insufficient documentation

## 2022-08-09 ENCOUNTER — Telehealth (INDEPENDENT_AMBULATORY_CARE_PROVIDER_SITE_OTHER): Payer: Self-pay | Admitting: Family Medicine

## 2022-08-09 DIAGNOSIS — Z20828 Contact with and (suspected) exposure to other viral communicable diseases: Secondary | ICD-10-CM

## 2022-08-09 MED ORDER — OSELTAMIVIR PHOSPHATE 75 MG PO CAPS
75.0000 mg | ORAL_CAPSULE | Freq: Every day | ORAL | 0 refills | Status: AC
Start: 2022-08-09 — End: 2022-08-16

## 2022-08-09 NOTE — Telephone Encounter (Signed)
Spoke with Patient's daughter Romilda Garret (auth.per [email or web address]). She states that patient currently asymptomatic but has been exposed to her daughter who recently tested positive for Influenza. Per Dr. Lupita Leash, as long as asymptomatic ok to send in Tamiflu 75 mg daily, #7, 0 RF -- See her notes under [facility]'s Mychart message dated 08/09/22.      Nurse will send in Tamiflu prescription to patient's local pharmacy of [facility] on [address]

## 2022-08-10 ENCOUNTER — Other Ambulatory Visit (INDEPENDENT_AMBULATORY_CARE_PROVIDER_SITE_OTHER): Payer: Self-pay | Admitting: Family Medicine

## 2022-08-10 DIAGNOSIS — E039 Hypothyroidism, unspecified: Secondary | ICD-10-CM

## 2022-08-11 ENCOUNTER — Telehealth (INDEPENDENT_AMBULATORY_CARE_PROVIDER_SITE_OTHER): Payer: Self-pay | Admitting: Family Medicine

## 2022-08-11 NOTE — Telephone Encounter (Signed)
Refill Request:    Last Office Visit (Less than 6 months): 02/02/2022  Scheduled or declined: 09/22/2022  Prescriber: Fredia Sorrow   Medication:   Name: levothyroxine (SYNTHROID) 75 MCG tablet   How many days left of meds: 6  Pharmacy: [facility], [place] - [id number] AT [place] needed: yes

## 2022-08-13 NOTE — Telephone Encounter (Signed)
Refill sent to [facility]  

## 2022-08-17 ENCOUNTER — Ambulatory Visit (INDEPENDENT_AMBULATORY_CARE_PROVIDER_SITE_OTHER): Payer: No Typology Code available for payment source | Admitting: Nurse Practitioner

## 2022-08-17 ENCOUNTER — Other Ambulatory Visit (INDEPENDENT_AMBULATORY_CARE_PROVIDER_SITE_OTHER): Payer: Self-pay

## 2022-08-17 ENCOUNTER — Encounter (INDEPENDENT_AMBULATORY_CARE_PROVIDER_SITE_OTHER): Payer: Self-pay | Admitting: Nurse Practitioner

## 2022-08-17 VITALS — BP 123/75 | HR 88 | Temp 97.7°F | Resp 16 | Ht 64.0 in | Wt 169.8 lb

## 2022-08-17 DIAGNOSIS — Z9181 History of falling: Secondary | ICD-10-CM

## 2022-08-17 DIAGNOSIS — R519 Headache, unspecified: Secondary | ICD-10-CM

## 2022-08-17 DIAGNOSIS — R011 Cardiac murmur, unspecified: Secondary | ICD-10-CM

## 2022-08-17 DIAGNOSIS — Z1231 Encounter for screening mammogram for malignant neoplasm of breast: Secondary | ICD-10-CM

## 2022-08-17 DIAGNOSIS — Z7901 Long term (current) use of anticoagulants: Secondary | ICD-10-CM

## 2022-08-17 DIAGNOSIS — M8589 Other specified disorders of bone density and structure, multiple sites: Secondary | ICD-10-CM

## 2022-08-17 DIAGNOSIS — R42 Dizziness and giddiness: Secondary | ICD-10-CM

## 2022-08-17 DIAGNOSIS — S0990XA Unspecified injury of head, initial encounter: Secondary | ICD-10-CM

## 2022-08-17 DIAGNOSIS — N1831 Chronic kidney disease, stage 3a: Secondary | ICD-10-CM

## 2022-08-17 DIAGNOSIS — S0992XA Unspecified injury of nose, initial encounter: Secondary | ICD-10-CM

## 2022-08-17 DIAGNOSIS — I25119 Atherosclerotic heart disease of native coronary artery with unspecified angina pectoris: Secondary | ICD-10-CM

## 2022-08-17 NOTE — Progress Notes (Signed)
[place]                       Date of Exam: 08/17/2022 10:51 AM        Patient ID: Christine Bridges is a 72 y.o. female.  Attending Physician: Fawn Kirk, FNP     Subjective:      Chief Complaint:  Chief Complaint   Patient presents with    Fall       HPI:      72 y.o. female patient presents here today for a fall.    Pt states she went ice skating about 2 weeks ago (approx 08/05/22)  She was not see anywhere after her injuries other than at ortho office   Pt states she fell on her face and cracked her nose on the railing  She also hit her left knee.  Pt is currently using a knee brace on her left knee  She believes she may have fractured her left knee  She has follow up with ortho next Friday (08/27/22)  Pt states she thought that her nose was okay but she has been having headaches lately, lightheadedness, runny nose and feels like she congested.     On asa 81 mg and plavix 75 mg         Problem List:  Patient Active Problem List   Diagnosis    GAD (generalized anxiety disorder)    Vitamin D deficiency    Mixed hyperlipidemia    Hypothyroidism    Benign essential hypertension    GERD (gastroesophageal reflux disease)    Hiatal hernia    Prediabetes    Stage 3a chronic kidney disease    On long term drug therapy    Allergic rhinitis    History of colonic polyps    Osteopenia    Palpitations    Polyp of colon    Rosacea    Overweight (BMI 25.0-29.9)    Primary osteoarthritis of both knees    Coronary artery disease involving native coronary artery of native heart with angina pectoris    Hypokalemia    Anemia    Hypertensive heart disease without heart failure    Other long term (current) drug therapy       Current Medications:  Current Outpatient Medications   Medication Sig Dispense Refill    aspirin 81 MG EC tablet Take 1 tablet (81 mg) by mouth daily      CALCIUM PO Take by mouth      cetirizine (ZyrTEC) 10 MG tablet every 24 hours      clopidogrel (PLAVIX) 75 mg  tablet Take 1 tablet (75 mg) by mouth      ketoconazole (NIZORAL) 2 % shampoo Apply topically once a week      levothyroxine (SYNTHROID) 75 MCG tablet TAKE 1 TABLET BY MOUTH EVERY DAY 90 tablet 0    metoprolol tartrate (LOPRESSOR) 25 MG tablet 1 tablet (25 mg) 2 (two) times daily      Multiple Vitamins-Minerals (MULTIVITAMIN ADULTS 50+ PO) multivitamin      pantoprazole (PROTONIX) 40 MG tablet Take 1 tablet (40 mg) by mouth daily 90 tablet 3    rosuvastatin (CRESTOR) 20 MG tablet Take 1 tablet (20 mg) by mouth nightly 90 tablet 3     No current facility-administered medications for this visit.       Allergies:  Allergies   Allergen Reactions    Albuterol Other (See Comments)  Tongue burning    Flagyl [Metronidazole]     Tape        Past Medical History:  Past Medical History:   Diagnosis Date    Abnormal vision     Has had an annual eye exam in 2016 and wears glasses which are new.     Chest tightness 02/21/2013    Osteoporosis     Rectal bleeding 02/21/2013       Past Surgical History:  Past Surgical History:   Procedure Laterality Date    Back/Spine surgery  06/15/1983    CARDIAC CATHETERIZATION  01/09/2021    positive for restenosis    CAROTID STENT      COLONOSCOPY  07/06/2018    Repeat in 5 years.     COLONOSCOPY  04/14/2013    Normal.     ENDOSCOPY UPPER GI  2016    normal, done x2    FOOT SURGERY Left 05/14/2010    Left foot.     Oral/Dental surgery  12/12/2017    OTHER SURGICAL HISTORY  06/14/2016    heart work up, stress test negative.    WRIST FRACTURE SURGERY Left 04/14/2013    Left wrist fracture.        Family History:  Family History   Problem Relation Age of Onset    Stroke Mother         Cerebrovascular accident.     Alzheimer's disease Mother     Heart disease Father     Stroke Father         Cerebrovascular accident.     Heart disease Brother     Other Brother         Heart valve replacement.     Colon cancer Paternal Grandmother         Malignant tumor of colon.        Social History:  Social  History     Socioeconomic History    Marital status: Single   Tobacco Use    Smoking status: Former     Packs/day: 1.00     Years: 14.00     Additional pack years: 0.00     Total pack years: 14.00     Types: Cigarettes     Quit date: 28     Years since quitting: 41.2    Smokeless tobacco: Never    Tobacco comments:     Most recent tobacco use screening: 09/30/2017.     Vaping Use    Vaping Use: Never used   Substance and Sexual Activity    Alcohol use: Yes     Comment: One drink or less per week.     Drug use: Not Currently    Sexual activity: Not Currently   Social History Narrative    Exercise: walking in the house    Lifetime fitness            Nutrition: has allowed foodlist 06/2020    Wt was 184lbs 11/2019    Check wt daily        Live with daughter and son-inlaw    3 grandchildren        Has 2 children        2 brothers and 2 sisters        Has good support system        Has medical living will 11/18/2020        Enjoys Nurse, learning disability, enjoys getting  together with friends  The following sections were reviewed this encounter by the provider:        ROS:  Review of Systems   Constitutional: Negative.    Eyes:  Negative for visual disturbance.   Musculoskeletal:  Positive for arthralgias and gait problem.   Neurological:  Positive for light-headedness and headaches. Negative for dizziness.   All other systems reviewed and are negative.      Vitals:  BP 123/75 (BP Site: Left arm, Patient Position: Sitting, Cuff Size: Small)   Pulse 88   Temp 97.7 F (36.5 C) (Tympanic)   Resp 16   Ht 1.626 m ('5\' 4"'$ )   Wt 77 kg (169 lb 12.8 oz)   SpO2 96%   BMI 29.15 kg/m      Objective:     Physical Exam:  Physical Exam  Vitals reviewed.   Constitutional:       Appearance: Normal appearance.   HENT:      Head: Normocephalic and atraumatic.      Nose: Nose normal. No congestion or rhinorrhea.   Cardiovascular:      Rate and Rhythm: Normal rate and regular rhythm.      Pulses: Normal pulses.      Heart sounds: Murmur  (longstanding) heard.   Pulmonary:      Effort: Pulmonary effort is normal.      Breath sounds: Normal breath sounds.   Musculoskeletal:      Right lower leg: No edema.      Left lower leg: No edema (knee brace in place).   Skin:     General: Skin is warm and dry.      Capillary Refill: Capillary refill takes less than 2 seconds.   Neurological:      General: No focal deficit present.      Mental Status: She is alert and oriented to person, place, and time. Mental status is at baseline.   Psychiatric:         Mood and Affect: Mood normal.          Assessment:     1. Injury of head, initial encounter  - CT Head WO Contrast; Future    2. New onset of headaches  - CT Head WO Contrast; Future    3. Lightheadedness  - CT Head WO Contrast; Future    4. Injury of nose, initial encounter  - XR Facial Bones 3 + Vw; Future    5. Stage 3a chronic kidney disease    6. Coronary artery disease involving native coronary artery of native heart with angina pectoris    7. Heart murmur    8. Current use of anticoagulant therapy  - CT Head WO Contrast; Future    9. Encounter for screening mammogram for malignant neoplasm of breast  - Mammo Digital Screening Bilateral W Cad; Future    10. Osteopenia of multiple sites  - Dxa Bone Density Axial Skeleton; Future    11. History of recent fall      Plan:     STAT CT of the head ordered today  Xray of facial bones ordered as well - not state  Pt aware that if injury to the head occurs again in the future she should go immediately to the ER for further evaluation  Will follow up with imaging resultsd  Schedule chronic care with PCP soon  Dexa and Mammogram orders given as well per pt request   Continue close follow up with ortho for left knee  Call the  office as needed    Fawn Kirk, FNPfall

## 2022-08-19 ENCOUNTER — Other Ambulatory Visit (INDEPENDENT_AMBULATORY_CARE_PROVIDER_SITE_OTHER): Payer: Self-pay

## 2022-08-19 DIAGNOSIS — S0992XA Unspecified injury of nose, initial encounter: Secondary | ICD-10-CM

## 2022-08-30 ENCOUNTER — Telehealth (INDEPENDENT_AMBULATORY_CARE_PROVIDER_SITE_OTHER): Payer: Self-pay | Admitting: Family Medicine

## 2022-08-30 NOTE — Telephone Encounter (Signed)
19-  Pts daughter called stating pt is c/o a worsening cough, sob, lethargic and sore throat. Pt was seen at urgent care 5 days ago and given viscous lidocaine. No visits available today in office in [place] or [place]. [name] advised ER but pt declined, info given for dispatch health.

## 2022-09-21 ENCOUNTER — Encounter (INDEPENDENT_AMBULATORY_CARE_PROVIDER_SITE_OTHER): Payer: Self-pay | Admitting: Gastroenterology

## 2022-09-21 NOTE — Progress Notes (Signed)
Called patient for triage

## 2022-09-22 ENCOUNTER — Ambulatory Visit (INDEPENDENT_AMBULATORY_CARE_PROVIDER_SITE_OTHER): Payer: Medicare Other | Admitting: Family Medicine

## 2022-09-22 ENCOUNTER — Telehealth (INDEPENDENT_AMBULATORY_CARE_PROVIDER_SITE_OTHER): Payer: Medicare Other | Admitting: Gastroenterology

## 2022-09-22 ENCOUNTER — Telehealth (INDEPENDENT_AMBULATORY_CARE_PROVIDER_SITE_OTHER): Payer: Self-pay

## 2022-09-22 ENCOUNTER — Encounter (INDEPENDENT_AMBULATORY_CARE_PROVIDER_SITE_OTHER): Payer: Self-pay | Admitting: Family Medicine

## 2022-09-22 VITALS — BP 130/70 | HR 76 | Temp 97.8°F | Resp 12 | Wt 163.2 lb

## 2022-09-22 DIAGNOSIS — D649 Anemia, unspecified: Secondary | ICD-10-CM

## 2022-09-22 DIAGNOSIS — K449 Diaphragmatic hernia without obstruction or gangrene: Secondary | ICD-10-CM

## 2022-09-22 DIAGNOSIS — I25119 Atherosclerotic heart disease of native coronary artery with unspecified angina pectoris: Secondary | ICD-10-CM

## 2022-09-22 DIAGNOSIS — N1831 Chronic kidney disease, stage 3a: Secondary | ICD-10-CM

## 2022-09-22 DIAGNOSIS — I119 Hypertensive heart disease without heart failure: Secondary | ICD-10-CM

## 2022-09-22 DIAGNOSIS — E559 Vitamin D deficiency, unspecified: Secondary | ICD-10-CM

## 2022-09-22 DIAGNOSIS — M8580 Other specified disorders of bone density and structure, unspecified site: Secondary | ICD-10-CM

## 2022-09-22 DIAGNOSIS — R7303 Prediabetes: Secondary | ICD-10-CM

## 2022-09-22 DIAGNOSIS — E039 Hypothyroidism, unspecified: Secondary | ICD-10-CM

## 2022-09-22 DIAGNOSIS — E782 Mixed hyperlipidemia: Secondary | ICD-10-CM

## 2022-09-22 DIAGNOSIS — R052 Subacute cough: Secondary | ICD-10-CM

## 2022-09-22 NOTE — Progress Notes (Signed)
Verbal consent has been obtained from the patient to conduct a telephone: yes    GASTROENTEROLOGY OUTPATIENT NOTE  Christine Bridges  MRN: [id number]  DOB: 10-Jun-1951  Acct #: [id number]  Adm: (Not on file)      Admitting Provider:  Oliva Bustard, MD  Primary Care Physician:  Satira Mccallum, MD    Chief Complaint: Christine Bridges is an 72 y.o. female presenting with a complaint of heartburn    She is taking Protonix 40mg  BID but still has heartburn    She has chest pain due to PO food    EGD:  mpression:  - Normal esophagus.  - Large hiatal hernia.  - Nodular gastritis. Biopsied.  - Normal examined duodenum. Biopsied.     COLONOSCOPY:  Impression:  - Mild diverticulosis in the sigmoid colon. There was no evidence of   diverticular bleeding.  - Non-bleeding external and internal hemorrhoids.  - The examined portion of the ileum was normal.     She is on PLAVIX    She has CAD with 4 stents    She still has stenosis of CAD and has episodes of chest pain    She is to go to cardiology for consideration of a cardiac stent    PMH:  Past Medical History:   Diagnosis Date    Abnormal vision     Has had an annual eye exam in 2016 and wears glasses which are new.     Benign essential hypertension     Chest tightness 02/21/2013    Osteoporosis     Rectal bleeding 02/21/2013         PSH:  Past Surgical History:   Procedure Laterality Date    Back/Spine surgery  06/15/1983    CARDIAC CATHETERIZATION  01/09/2021    positive for restenosis    CAROTID STENT      COLONOSCOPY  07/06/2018    Repeat in 5 years.     COLONOSCOPY  04/14/2013    Normal.     ENDOSCOPY UPPER GI  2016    normal, done x2    FOOT SURGERY Left 05/14/2010    Left foot.     Oral/Dental surgery  12/12/2017    OTHER SURGICAL HISTORY  06/14/2016    heart work up, stress test negative.    WRIST FRACTURE SURGERY Left 04/14/2013    Left wrist fracture.         SH:  Social History     Tobacco Use    Smoking status: Former     Current packs/day: 0.00      Average packs/day: 1 pack/day for 14.0 years (14.0 ttl pk-yrs)     Types: Cigarettes     Start date: 21     Quit date: 1983     Years since quitting: 41.3    Smokeless tobacco: Never    Tobacco comments:     Most recent tobacco use screening: 09/30/2017.     Substance Use Topics    Alcohol use: Yes     Comment: One drink or less per week.        FH:  Family History   Problem Relation Age of Onset    Stroke Mother         Cerebrovascular accident.     Alzheimer's disease Mother     Heart disease Father     Stroke Father         Cerebrovascular accident.     Heart disease Brother  Other Brother         Heart valve replacement.     Colon cancer Paternal Grandmother         Malignant tumor of colon.        Home Medications:  Prior to Admission medications    Medication Sig Start Date End Date Taking? Authorizing Provider   aspirin 81 MG EC tablet Take 1 tablet (81 mg) by mouth daily    [provider]   CALCIUM PO Take by mouth    [provider]   cetirizine (ZyrTEC) 10 MG tablet every 24 hours    [provider]   clopidogrel (PLAVIX) 75 mg tablet Take 1 tablet (75 mg) by mouth 06/23/20   [provider]   levothyroxine (SYNTHROID) 75 MCG tablet TAKE 1 TABLET BY MOUTH EVERY DAY 08/11/22   Satira Mccallum, MD   metoprolol tartrate (LOPRESSOR) 25 MG tablet 1 tablet (25 mg) 2 (two) times daily 10/28/20   [provider]   Multiple Vitamins-Minerals (MULTIVITAMIN ADULTS 50+ PO) multivitamin    [provider]   pantoprazole (PROTONIX) 40 MG tablet Take 1 tablet (40 mg) by mouth daily 04/29/22   Kc, Lilly, MD   rosuvastatin (CRESTOR) 20 MG tablet Take 1 tablet (20 mg) by mouth nightly 02/12/22   Satira Mccallum, MD   ketoconazole (NIZORAL) 2 % shampoo Apply topically once a week  Patient not taking: Reported on 09/22/2022 08/16/22 09/22/22  [provider]       Allergies:  Allergies   Allergen Reactions    Albuterol Other (See Comments)     Tongue burning    Flagyl  [Metronidazole]     Tape         Review of Systems:  ROS   Objective:    Vital Signs:   There were no vitals taken for this visit.    Physical Exam:   Physical Exam     Most Recent Laboratory Studies:    Laboratory:  No visits with results within 1 Day(s) from this visit.   Latest known visit with results is:   Appointment on 02/10/2022   Component Date Value Ref Range Status    Glucose 02/10/2022 101 (H)  70 - 100 mg/dL Final    BUN 16/03/9603 17.0  7.0 - 21.0 mg/dL Final    Creatinine 54/02/8118 1.0  0.4 - 1.0 mg/dL Final    Sodium 14/78/2956 137  135 - 145 mEq/L Final    Potassium 02/10/2022 5.1  3.5 - 5.3 mEq/L Final    Chloride 02/10/2022 104  99 - 111 mEq/L Final    CO2 02/10/2022 26  17 - 29 mEq/L Final    Calcium 02/10/2022 9.6  7.9 - 10.2 mg/dL Final    Protein, Total 02/10/2022 6.9  6.0 - 8.3 g/dL Final    Albumin 21/30/8657 4.0  3.5 - 5.0 g/dL Final    AST (SGOT) 84/69/6295 29  5 - 41 U/L Final    ALT 02/10/2022 33  0 - 55 U/L Final    Alkaline Phosphatase 02/10/2022 70  37 - 117 U/L Final    Bilirubin, Total 02/10/2022 1.4 (H)  0.2 - 1.2 mg/dL Final    Globulin 28/41/3244 2.9  2.0 - 3.6 g/dL Final    Albumin/Globulin Ratio 02/10/2022 1.4  0.9 - 2.2 Final    Anion Gap 02/10/2022 7.0  5.0 - 15.0 Final    eGFR 02/10/2022 59.6 (A)  >=60 mL/min/1.73 m2 Final    Cholesterol  02/10/2022 149  0 - 199 mg/dL Final    Triglycerides 02/10/2022 94  34 - 149 mg/dL Final    HDL 01/60/1093 62  40 - 9,999 mg/dL Final    LDL Calculated 02/10/2022 68  0 - 99 mg/dL Final    VLDL Calculated 02/10/2022 19  10 - 40 mg/dL Final    Cholesterol / HDL Ratio 02/10/2022 2.4  See Below Index Final    WBC 02/10/2022 5.11  3.10 - 9.50 x10 3/uL Final    Hgb 02/10/2022 13.6  11.4 - 14.8 g/dL Final    Hematocrit 23/55/7322 39.8  34.7 - 43.7 % Final    Platelets 02/10/2022 281  142 - 346 x10 3/uL Final    RBC 02/10/2022 4.49  3.90 - 5.10 x10 6/uL Final    MCV 02/10/2022 88.6  78.0 - 96.0 fL Final    MCH 02/10/2022 30.3  25.1 - 33.5 pg Final     MCHC 02/10/2022 34.2  31.5 - 35.8 g/dL Final    RDW 02/54/2706 13  11 - 15 % Final    MPV 02/10/2022 10.1  8.9 - 12.5 fL Final    Instrument Absolute Neutrophil Cou* 02/10/2022 2.70  1.10 - 6.33 x10 3/uL Final    Neutrophils 02/10/2022 52.8  None % Final    Lymphocytes Automated 02/10/2022 32.3  None % Final    Monocytes 02/10/2022 10.6  None % Final    Eosinophils Automated 02/10/2022 3.3  None % Final    Basophils Automated 02/10/2022 0.8  None % Final    Immature Granulocytes 02/10/2022 0.2  None % Final    Nucleated RBC 02/10/2022 0.0  0.0 - 0.0 /100 WBC Final    Neutrophils Absolute 02/10/2022 2.70  1.10 - 6.33 x10 3/uL Final    Lymphocytes Absolute Automated 02/10/2022 1.65  0.42 - 3.22 x10 3/uL Final    Monocytes Absolute Automated 02/10/2022 0.54  0.21 - 0.85 x10 3/uL Final    Eosinophils Absolute Automated 02/10/2022 0.17  0.00 - 0.44 x10 3/uL Final    Basophils Absolute Automated 02/10/2022 0.04  0.00 - 0.08 x10 3/uL Final    Immature Granulocytes Absolute 02/10/2022 0.01  0.00 - 0.07 x10 3/uL Final    Absolute NRBC 02/10/2022 0.00  0.00 - 0.00 x10 3/uL Final    Hemolysis Index 02/10/2022 7  0 - 24 Index Final           Assessment & Plan:    GERD due to hiatal hernia:  She is a Jehovah witness and needs cardiology evaluation.  Currently she is not a candidate for a hiatal hernia repair and TIF.  We can readress if cardiology clears her and / or after stent  Proceed with procedure as planned    Electronic Signature:   Oliva Bustard, MD (Signed 09/22/2022 3:25 PM)  Service - Gastroenterology

## 2022-09-22 NOTE — Progress Notes (Signed)
Subjective:      Patient ID: Christine Bridges is a 72 y.o. female.    Chief Complaint:  Chief Complaint   Patient presents with    Medication Refill       HPI:  HPI  72 y/o female presents today for medication follow up.  Patient not fasting today.    Hyperlipidemia-  No regular exercises x6 weeks.  Patient hurt leg.  Not eating well balanced meals.  Drinks plenty of water daily.    Hypertension-  Takes med as prescribed.  Average 120s/70s  Checks bp occasionally.  Denies chest pain, dizziness, headaches or shortness of breath.  Limits sodium.    L knee fracture and fell again. Was in the middle of balance therapy    Osteopenia: getting done in June    Hiatal hernia: taking Ppi,  has a large one and has appt with other GI specialist to look at options    Hypothyroid:  taking levothyroxine    Peakflow: predicted 227-330    Got 300, 300, 310  Problem List:  Patient Active Problem List   Diagnosis    GAD (generalized anxiety disorder)    Vitamin D deficiency    Mixed hyperlipidemia    Hypothyroidism    GERD (gastroesophageal reflux disease)    Hiatal hernia    Prediabetes    Stage 3a chronic kidney disease    On long term drug therapy    Allergic rhinitis    History of colonic polyps    Osteopenia    Palpitations    Polyp of colon    Rosacea    Overweight (BMI 25.0-29.9)    Primary osteoarthritis of both knees    Coronary artery disease involving native coronary artery of native heart with angina pectoris    Hypokalemia    Anemia    Hypertensive heart disease without heart failure    Other long term (current) drug therapy       Current Medications:  Current Outpatient Medications   Medication Sig Dispense Refill    aspirin 81 MG EC tablet Take 1 tablet (81 mg) by mouth daily      CALCIUM PO Take by mouth      cetirizine (ZyrTEC) 10 MG tablet every 24 hours      clopidogrel (PLAVIX) 75 mg tablet Take 1 tablet (75 mg) by mouth      levothyroxine (SYNTHROID) 75 MCG tablet TAKE 1 TABLET BY MOUTH EVERY DAY 90 tablet 0     metoprolol tartrate (LOPRESSOR) 25 MG tablet 1 tablet (25 mg) 2 (two) times daily      Multiple Vitamins-Minerals (MULTIVITAMIN ADULTS 50+ PO) multivitamin      pantoprazole (PROTONIX) 40 MG tablet Take 1 tablet (40 mg) by mouth daily 90 tablet 3    rosuvastatin (CRESTOR) 20 MG tablet Take 1 tablet (20 mg) by mouth nightly 90 tablet 3     No current facility-administered medications for this visit.       Allergies:  Allergies   Allergen Reactions    Albuterol Other (See Comments)     Tongue burning    Flagyl [Metronidazole]     Tape        Past Medical History:  Past Medical History:   Diagnosis Date    Abnormal vision     Has had an annual eye exam in 2016 and wears glasses which are new.     Benign essential hypertension     Chest tightness 02/21/2013    Osteoporosis  Rectal bleeding 02/21/2013       Past Surgical History:  Past Surgical History:   Procedure Laterality Date    Back/Spine surgery  06/15/1983    CARDIAC CATHETERIZATION  01/09/2021    positive for restenosis    CAROTID STENT      COLONOSCOPY  07/06/2018    Repeat in 5 years.     COLONOSCOPY  04/14/2013    Normal.     ENDOSCOPY UPPER GI  2016    normal, done x2    FOOT SURGERY Left 05/14/2010    Left foot.     Oral/Dental surgery  12/12/2017    OTHER SURGICAL HISTORY  06/14/2016    heart work up, stress test negative.    WRIST FRACTURE SURGERY Left 04/14/2013    Left wrist fracture.        Family History:  Family History   Problem Relation Age of Onset    Stroke Mother         Cerebrovascular accident.     Alzheimer's disease Mother     Heart disease Father     Stroke Father         Cerebrovascular accident.     Heart disease Brother     Other Brother         Heart valve replacement.     Colon cancer Paternal Grandmother         Malignant tumor of colon.        Social History:  Social History     Socioeconomic History    Marital status: Single   Tobacco Use    Smoking status: Former     Current packs/day: 0.00     Average packs/day: 1 pack/day  for 14.0 years (14.0 ttl pk-yrs)     Types: Cigarettes     Start date: 23     Quit date: 1983     Years since quitting: 41.3    Smokeless tobacco: Never    Tobacco comments:     Most recent tobacco use screening: 09/30/2017.     Vaping Use    Vaping status: Never Used   Substance and Sexual Activity    Alcohol use: Yes     Comment: One drink or less per week.     Drug use: Not Currently    Sexual activity: Not Currently   Social History Narrative    Exercise: walking in the house    Lifetime fitness            Nutrition: has allowed foodlist 06/2020    Wt was 184lbs 11/2019    Check wt daily        Live with daughter and son-inlaw    3 grandchildren        Has 2 children        2 brothers and 2 sisters        Has good support system        Has medical living will 11/18/2020        Enjoys Clinical biochemist, enjoys getting  together with friends     Social Determinants of Health     Financial Resource Strain: Low Risk  (12/16/2020)    Received from [facility]    Overall Financial Resource Strain (CARDIA)     Difficulty of Paying Living Expenses: Not hard at all   Stress: No Stress Concern Present (12/16/2020)    Received from [facility] - Occupational Stress  Questionnaire     Feeling of Stress : Not at all    Received from [facility]    Social Network    Received from [facility]    HITS        The following sections were reviewed this encounter by the provider:   Tobacco  Allergies  Meds  Problems  Med Hx  Surg Hx  Fam Hx         ROS:  Review of Systems    Vitals:  BP 130/70   Pulse 76   Temp 97.8 F (36.6 C)   Resp 12   Wt 74 kg (163 lb 3.2 oz)   BMI 28.01 kg/m      Objective:     Physical Exam:  Physical Exam  Constitutional:       General: She is not in acute distress.  HENT:      Head: Atraumatic.      Right Ear: Tympanic membrane, ear canal and external ear normal. There is no impacted cerumen.      Left Ear: Tympanic membrane, ear canal and external  ear normal. There is no impacted cerumen.      Nose: No congestion or rhinorrhea.      Mouth/Throat:      Pharynx: No oropharyngeal exudate or posterior oropharyngeal erythema.   Cardiovascular:      Rate and Rhythm: Regular rhythm.      Heart sounds: Murmur (2/6) heard.   Pulmonary:      Effort: Pulmonary effort is normal.      Breath sounds: Normal breath sounds. No wheezing.   Abdominal:      Tenderness: There is no abdominal tenderness.   Musculoskeletal:         General: No swelling or tenderness.      Cervical back: Neck supple.   Neurological:      General: No focal deficit present.   Psychiatric:         Behavior: Behavior normal.          Assessment:     1. Stage 3a chronic kidney disease  - Comprehensive metabolic panel; Future    2. Osteopenia, unspecified location    3. Acquired hypothyroidism  - TSH; Future    4. Coronary artery disease involving native coronary artery of native heart with angina pectoris    5. Mixed hyperlipidemia  - Lipid panel; Future    6. Hypertensive heart disease without heart failure    7. Hiatal hernia    8. Subacute cough  - Peak flow; Future    9. Vitamin D deficiency  - Vitamin D,25 OH, Total; Future    10. Prediabetes  - Hemoglobin A1C; Future    11. Anemia, unspecified type  - CBC and differential; Future      Plan:     Send cbc to Dr. Talmadge Coventry    Continue current medicines  Side effect of meds discussed  Do healthy , high fiber, low cholesterol, low carb diet  Exercise most days  Plan for wellness visit every year  Follow up visit with [name] in 4 months    Satira Mccallum, MD

## 2022-09-22 NOTE — Telephone Encounter (Signed)
04/10 called pt to remind them of their appt on 04/11 and to complete their echeck in and arrive 15 min prior to appt time. If any questions call [phone]

## 2022-09-23 ENCOUNTER — Telehealth (INDEPENDENT_AMBULATORY_CARE_PROVIDER_SITE_OTHER): Payer: Medicare Other | Admitting: Gastroenterology

## 2022-09-23 ENCOUNTER — Other Ambulatory Visit (FREE_STANDING_LABORATORY_FACILITY): Payer: Medicare Other

## 2022-09-23 DIAGNOSIS — D649 Anemia, unspecified: Secondary | ICD-10-CM

## 2022-09-23 DIAGNOSIS — R7303 Prediabetes: Secondary | ICD-10-CM

## 2022-09-23 DIAGNOSIS — K219 Gastro-esophageal reflux disease without esophagitis: Secondary | ICD-10-CM

## 2022-09-23 DIAGNOSIS — E559 Vitamin D deficiency, unspecified: Secondary | ICD-10-CM

## 2022-09-23 DIAGNOSIS — N1831 Chronic kidney disease, stage 3a: Secondary | ICD-10-CM

## 2022-09-23 DIAGNOSIS — E782 Mixed hyperlipidemia: Secondary | ICD-10-CM

## 2022-09-23 DIAGNOSIS — E039 Hypothyroidism, unspecified: Secondary | ICD-10-CM

## 2022-09-23 LAB — COMPREHENSIVE METABOLIC PANEL
ALT: 23 U/L (ref 0–55)
AST (SGOT): 26 U/L (ref 5–41)
Albumin/Globulin Ratio: 1.4 (ref 0.9–2.2)
Albumin: 3.9 g/dL (ref 3.5–5.0)
Alkaline Phosphatase: 88 U/L (ref 37–117)
Anion Gap: 8 (ref 5.0–15.0)
BUN: 26 mg/dL — ABNORMAL HIGH (ref 7.0–21.0)
Bilirubin, Total: 1.4 mg/dL — ABNORMAL HIGH (ref 0.2–1.2)
CO2: 27 mEq/L (ref 17–29)
Calcium: 10 mg/dL (ref 7.9–10.2)
Chloride: 106 mEq/L (ref 99–111)
Creatinine: 1.2 mg/dL — ABNORMAL HIGH (ref 0.4–1.0)
Globulin: 2.8 g/dL (ref 2.0–3.6)
Glucose: 94 mg/dL (ref 70–100)
Potassium: 5.3 mEq/L (ref 3.5–5.3)
Protein, Total: 6.7 g/dL (ref 6.0–8.3)
Sodium: 141 mEq/L (ref 135–145)
eGFR: 47.8 mL/min/{1.73_m2} — AB (ref >=60)

## 2022-09-23 LAB — CBC AND DIFFERENTIAL
Absolute NRBC: 0 10*3/uL (ref 0.00–0.00)
Basophils Absolute Automated: 0.07 10*3/uL (ref 0.00–0.08)
Basophils Automated: 1.2 %
Eosinophils Absolute Automated: 0.38 10*3/uL (ref 0.00–0.44)
Eosinophils Automated: 6.5 %
Hematocrit: 40 % (ref 34.7–43.7)
Hgb: 12.7 g/dL (ref 11.4–14.8)
Immature Granulocytes Absolute: 0.01 10*3/uL (ref 0.00–0.07)
Immature Granulocytes: 0.2 %
Instrument Absolute Neutrophil Count: 3.11 10*3/uL (ref 1.10–6.33)
Lymphocytes Absolute Automated: 1.68 10*3/uL (ref 0.42–3.22)
Lymphocytes Automated: 28.7 %
MCH: 28.4 pg (ref 25.1–33.5)
MCHC: 31.8 g/dL (ref 31.5–35.8)
MCV: 89.5 fL (ref 78.0–96.0)
MPV: 10.6 fL (ref 8.9–12.5)
Monocytes Absolute Automated: 0.61 10*3/uL (ref 0.21–0.85)
Monocytes: 10.4 %
Neutrophils Absolute: 3.11 10*3/uL (ref 1.10–6.33)
Neutrophils: 53 %
Nucleated RBC: 0 /100 WBC (ref 0.0–0.0)
Platelets: 255 10*3/uL (ref 142–346)
RBC: 4.47 10*6/uL (ref 3.90–5.10)
RDW: 14 % (ref 11–15)
WBC: 5.86 10*3/uL (ref 3.10–9.50)

## 2022-09-23 LAB — LIPID PANEL
Cholesterol / HDL Ratio: 2.6 Index
Cholesterol: 153 mg/dL (ref 0–199)
HDL: 59 mg/dL (ref 40–9999)
LDL Calculated: 77 mg/dL (ref 0–99)
Triglycerides: 87 mg/dL (ref 34–149)
VLDL Calculated: 17 mg/dL (ref 10–40)

## 2022-09-23 LAB — HEMOGLOBIN A1C
Average Estimated Glucose: 114 mg/dL
Hemoglobin A1C: 5.6 % (ref 4.6–5.6)

## 2022-09-23 LAB — TSH: TSH: 0.33 u[IU]/mL — ABNORMAL LOW (ref 0.35–4.94)

## 2022-09-23 LAB — HEMOLYSIS INDEX(SOFT): Hemolysis Index: 3 Index (ref 0–24)

## 2022-09-23 LAB — VITAMIN D, 25 OH, TOTAL: Vitamin D, 25 OH, Total: 41 ng/mL (ref 30–100)

## 2022-09-28 ENCOUNTER — Other Ambulatory Visit (INDEPENDENT_AMBULATORY_CARE_PROVIDER_SITE_OTHER): Payer: Self-pay | Admitting: Family Medicine

## 2022-09-28 DIAGNOSIS — E039 Hypothyroidism, unspecified: Secondary | ICD-10-CM

## 2022-09-28 MED ORDER — LEVOTHYROXINE SODIUM 75 MCG PO TABS
75.0000 ug | ORAL_TABLET | Freq: Every day | ORAL | 0 refills | Status: DC
Start: 2022-09-28 — End: 2022-12-13

## 2022-09-28 NOTE — Progress Notes (Signed)
Hi Nurse/Clinical staff    Please advise patient I reviewed the results and below are the comments and recommendations:    Tsh slight low so change  to levoth\yroxine daily except none on Sunday, sent  to the pharmacy. Need office visit 30 min in 6-8weeks to recheck this  and cholesterol fasting    Kidneys creatinine slight reduced further. Make sure not taking any advil/aleve like meds    Cholesterol shows LDL 77 and best under 70, ask patient if room to improve on low cholesterol diet, otherwise ,may need to raise rosuvastatin to 40mg  daily    Normal sugars and cbc    Thank you.  Dr. Jonathon Bellows

## 2022-09-29 ENCOUNTER — Encounter (INDEPENDENT_AMBULATORY_CARE_PROVIDER_SITE_OTHER): Payer: Self-pay

## 2022-09-30 ENCOUNTER — Encounter (INDEPENDENT_AMBULATORY_CARE_PROVIDER_SITE_OTHER): Payer: Self-pay

## 2022-10-05 ENCOUNTER — Other Ambulatory Visit: Payer: Self-pay | Admitting: Family Medicine

## 2022-10-12 ENCOUNTER — Telehealth (INDEPENDENT_AMBULATORY_CARE_PROVIDER_SITE_OTHER): Payer: Self-pay | Admitting: Family Medicine

## 2022-10-12 DIAGNOSIS — E039 Hypothyroidism, unspecified: Secondary | ICD-10-CM

## 2022-10-12 DIAGNOSIS — E782 Mixed hyperlipidemia: Secondary | ICD-10-CM

## 2022-10-12 NOTE — Telephone Encounter (Signed)
Patient requesting needful lab order prior to her next apt which is on 11/15/2022.    Patient also requested a call from clinical to discuss the change in her current medication doses due to her recent lab result.      Pt's Phone: [phone]

## 2022-10-13 NOTE — Telephone Encounter (Signed)
Dr. Jonathon Bellows,    Please advise. Patient already has set up her follow-up appt.with you in June. Per your annotations under labs, will need to recheck her TSH and cholesterol. Is it ok to put in order so patient can have her blood work done prior to her scheduled appt.with you so she can discuss the result with your during her visit?

## 2022-10-17 NOTE — Telephone Encounter (Signed)
Nurse pls put orders, ok to do as requested TY

## 2022-10-18 NOTE — Telephone Encounter (Signed)
LMTCB to schedule lab appt prior to VV on 06/03. thanks

## 2022-10-18 NOTE — Telephone Encounter (Signed)
Future lab orders placed in chart.

## 2022-10-27 ENCOUNTER — Encounter (INDEPENDENT_AMBULATORY_CARE_PROVIDER_SITE_OTHER): Payer: Self-pay | Admitting: Family Medicine

## 2022-10-29 ENCOUNTER — Encounter (INDEPENDENT_AMBULATORY_CARE_PROVIDER_SITE_OTHER): Payer: Self-pay | Admitting: Family Medicine

## 2022-10-29 ENCOUNTER — Ambulatory Visit (INDEPENDENT_AMBULATORY_CARE_PROVIDER_SITE_OTHER): Payer: Medicare Other | Admitting: Family Medicine

## 2022-10-29 VITALS — BP 120/78 | HR 78 | Temp 97.2°F | Ht 64.0 in | Wt 160.0 lb

## 2022-10-29 DIAGNOSIS — J069 Acute upper respiratory infection, unspecified: Secondary | ICD-10-CM

## 2022-10-29 DIAGNOSIS — J4521 Mild intermittent asthma with (acute) exacerbation: Secondary | ICD-10-CM

## 2022-10-29 DIAGNOSIS — J301 Allergic rhinitis due to pollen: Secondary | ICD-10-CM

## 2022-10-29 MED ORDER — PREDNISONE 20 MG PO TABS
40.0000 mg | ORAL_TABLET | Freq: Every day | ORAL | 0 refills | Status: DC
Start: 2022-10-29 — End: 2022-11-15

## 2022-10-29 NOTE — Progress Notes (Signed)
Christine Bridges  Family Medicine                    Date of Exam: 10/29/2022 11:37 AM      Patient ID: Christine Bridges is a 72 y.o. female.  Attending Physician: Felipa Evener, MD      Chief Complaint:   As per HPI.  Otherwise -   Chief Complaint   Patient presents with    Cough               HPI:   Christine Bridges is 71 y/o F presents here for cough   Pt stated she was seen virtually for cough with urgent care   She was given abx Augmentin for 6 days, still has a lot of coughing.   She can hear wheezing.   Denies hx of asthma or COPD  Pt also has seasonal allergies.   She denies fever, sob, chest pain          ROS:   As per HPI.  Otherwise as below.  Review of Systems   +cough, wheezing  No fever, chills, n/v   +post nasal drainage         Problem List:   Patient Active Problem List   Diagnosis    GAD (generalized anxiety disorder)    Vitamin D deficiency    Mixed hyperlipidemia    Hypothyroidism    GERD (gastroesophageal reflux disease)    Hiatal hernia    Prediabetes    Stage 3a chronic kidney disease    On long term drug therapy    Allergic rhinitis    History of colonic polyps    Osteopenia    Palpitations    Polyp of colon    Rosacea    Overweight (BMI 25.0-29.9)    Primary osteoarthritis of both knees    Coronary artery disease involving native coronary artery of native heart with angina pectoris    Hypokalemia    Anemia    Hypertensive heart disease without heart failure    Other long term (current) drug therapy           Current Meds:   Outpatient Medications Marked as Taking for the 10/29/22 encounter (Office Visit) with Carlethia Mesquita, Melvenia Beam, MD   Medication Sig Dispense Refill    amoxicillin-clavulanate (AUGMENTIN) 875-125 MG per tablet Take 1 tablet by mouth 2 (two) times daily      aspirin 81 MG EC tablet Take 1 tablet (81 mg) by mouth daily      CALCIUM PO Take by mouth      cetirizine (ZyrTEC) 10 MG tablet every 24 hours      clopidogrel (PLAVIX) 75 mg tablet Take 1 tablet (75 mg)  by mouth      levothyroxine (SYNTHROID) 75 MCG tablet Take 1 tablet (75 mcg) by mouth daily Except none on Sunday 90 tablet 0    metoprolol tartrate (LOPRESSOR) 25 MG tablet 1 tablet (25 mg) 2 (two) times daily      Multiple Vitamins-Minerals (MULTIVITAMIN ADULTS 50+ PO) multivitamin      pantoprazole (PROTONIX) 40 MG tablet Take 1 tablet (40 mg) by mouth daily 90 tablet 3    rosuvastatin (CRESTOR) 20 MG tablet Take 1 tablet (20 mg) by mouth nightly 90 tablet 3         Allergies:   Allergies   Allergen Reactions    Albuterol Other (See Comments)     Tongue burning  Flagyl [Metronidazole]     Tape         Past Surgical History:   Past Surgical History:   Procedure Laterality Date    Back/Spine surgery  06/15/1983    CARDIAC CATHETERIZATION  01/09/2021    positive for restenosis    CAROTID STENT      COLONOSCOPY, DIAGNOSTIC (SCREENING)  07/06/2018    Repeat in 5 years.     COLONOSCOPY, DIAGNOSTIC (SCREENING)  04/14/2013    Normal.     ESOPHAGOSCOPY, RIGID, TRANSORAL  2016    normal, done x2    FOOT SURGERY Left 05/14/2010    Left foot.     Oral/Dental surgery  12/12/2017    OTHER SURGICAL HISTORY  06/14/2016    heart work up, stress test negative.    WRIST FRACTURE SURGERY Left 04/14/2013    Left wrist fracture.           Family History:   Family History   Problem Relation Age of Onset    Stroke Mother         Cerebrovascular accident.     Alzheimer's disease Mother     Heart disease Father     Stroke Father         Cerebrovascular accident.     Heart disease Brother     Other Brother         Heart valve replacement.     Colon cancer Paternal Grandmother         Malignant tumor of colon.           Social History:   Social History     Tobacco Use    Smoking status: Former     Current packs/day: 0.00     Average packs/day: 1 pack/day for 14.0 years (14.0 ttl pk-yrs)     Types: Cigarettes     Start date: 76     Quit date: 1983     Years since quitting: 41.4    Smokeless tobacco: Never    Tobacco comments:     Most  recent tobacco use screening: 09/30/2017.     Vaping Use    Vaping status: Never Used   Substance Use Topics    Alcohol use: Yes     Comment: One drink or less per week.     Drug use: Not Currently          The following sections were reviewed this encounter by the provider:   Tobacco  Allergies  Meds  Problems  Med Hx  Surg Hx  Fam Hx              Vital Signs:   BP 120/78 (BP Site: Right arm, Patient Position: Sitting)   Pulse 78   Temp 97.2 F (36.2 C)   Ht 1.626 m (5\' 4" )   Wt 72.6 kg (160 lb)   BMI 27.46 kg/m           Physical Exam:   Physical Exam     PHYSICAL EXAM:   General appearance: well appearing in nad  Head: Atraumatic, normocephalic  Eyes:  no sclera icterus, no conjunctivitis   Ear: patent ear canals, normal TM b/l   Nose: patent nares, pink mucosa, no rhinorrhea   Mouth: mucous membranes moist, no sores noted  Neck: no lymphadenopathy. Pink oropharynx, no exudate  Chest: diffuse wheezing and rhonchi, no crackles. Adequate aeration.   CV: Regular rate and rhythm S1 S2, loud holosystolic murmur (known)  Abdomen: soft, nontender,  nondistended, active bowel sound.   Neuro: normal gait, A&Ox3, CN 2-12 grossly intact   Extremities: no edema.    Skin: no rashes, good turgor            Procedure(s):   As per Procedure Note.  Otherwise as below.  Procedures          Assessment:   1. Mild intermittent reactive airway disease with acute exacerbation  - predniSONE (DELTASONE) 20 MG tablet; Take 2 tablets (40 mg) by mouth daily  Dispense: 10 tablet; Refill: 0    2. Seasonal allergic rhinitis due to pollen           Plan:   Reactive airway disease worsening with URI- start prednisone x5 days, discussed potential SE. Discussed to drink warm tea and honey to help with cough  Pt declined tessalon as it has not helped her in the past.     Seasonal allergic rhinitis  - cont OTC antihistamine    URI - cont supportive tx, discussed that is likely the reason why she didn't improve much with abx.             Follow-up:   No follow-ups on file.          Melvenia Beam Tyiana Hill, MD

## 2022-11-02 ENCOUNTER — Telehealth (INDEPENDENT_AMBULATORY_CARE_PROVIDER_SITE_OTHER): Payer: Self-pay | Admitting: Family Medicine

## 2022-11-02 NOTE — Telephone Encounter (Signed)
Name of Caller: Self  Clinical Question: Patient still has bad cough taking last dose of Prednisone 20mg . What else can she do?   Last DOS: 10-29-22  Caller's preferred ph: [phone]  Please note:

## 2022-11-04 NOTE — Telephone Encounter (Signed)
Pls let pt know cough can linger for a few weeks after an infection. Please try mucinex over the counter to see if it helps.

## 2022-11-04 NOTE — Telephone Encounter (Signed)
Pt informed and v/u

## 2022-11-15 ENCOUNTER — Encounter (INDEPENDENT_AMBULATORY_CARE_PROVIDER_SITE_OTHER): Payer: Self-pay | Admitting: Family Medicine

## 2022-11-15 ENCOUNTER — Telehealth (INDEPENDENT_AMBULATORY_CARE_PROVIDER_SITE_OTHER): Payer: Medicare Other | Admitting: Family Medicine

## 2022-11-15 VITALS — Temp 98.6°F | Ht 64.0 in | Wt 168.0 lb

## 2022-11-15 DIAGNOSIS — E039 Hypothyroidism, unspecified: Secondary | ICD-10-CM

## 2022-11-15 DIAGNOSIS — H900 Conductive hearing loss, bilateral: Secondary | ICD-10-CM

## 2022-11-15 DIAGNOSIS — N1831 Chronic kidney disease, stage 3a: Secondary | ICD-10-CM

## 2022-11-15 DIAGNOSIS — I25119 Atherosclerotic heart disease of native coronary artery with unspecified angina pectoris: Secondary | ICD-10-CM

## 2022-11-15 DIAGNOSIS — E782 Mixed hyperlipidemia: Secondary | ICD-10-CM

## 2022-11-15 NOTE — Addendum Note (Signed)
Addended by: Satira Mccallum on: 11/15/2022 04:44 PM     Modules accepted: Level of Service

## 2022-11-15 NOTE — Progress Notes (Signed)
[facility] FAMILY MEDICINE - [place]                       Date of Virtual Visit: 11/15/2022 10:43 AM        Patient ID: Christine Bridges is a 73 y.o. female.  Attending Physician: Satira Mccallum, MD       Telemedicine Eligibility:    State Location:  [x]  [place]  []  [place]  []  [place] []  [place]  []  Other:    Physical Location:  [x]  Home  []         []        []          []  Other:    Patient Identity Verification:  [x]  State Issued ID  []  Insurance Eligibility Check  []  Other:    Physical Address Verification: (for 911)  [x]  Yes  []  No    Personal identity shared with patient:  [x]  Yes  []  No    Education on nature of video visit shared with patient:  [x]  Yes  []  No    Emergency plan agreed upon with patient:  [x]  Yes  []  No    If the patient had not had this virtual visit, what would they have done?  []         []         []        []          []  Other:    Visit terminated since not appropriate for virtual care:  [x]  N/A  []  Reason:         Chief Complaint:    Chief Complaint   Patient presents with    lab results               HPI:    HPI  72 y/o female presents today for video visit.  Verbal consent given to bill insurance.  Patient currently in the state of .    Hypertension-  Average bp 130s/80s    Hyperlipidemia-  No regular exercises x4 months.  Drinks plenty of water daily.  Not eating well balanced meals.  Eats increased amounts of sugar.        Hypothyroid-  Takes med as prescribed.  Lowered to 6days a week on 4/10    Ckd: 3a 47.8 last time  Does not take nsaids  Drinks water    Lipids: LDL 77, hdl went down  to 59  Likes steak    Wt: 163lbs , gone up 5 lbs    Feeling down not able to see siblings            Problem List:    Patient Active Problem List   Diagnosis    GAD (generalized anxiety disorder)    Vitamin D deficiency    Mixed hyperlipidemia    Hypothyroidism    GERD (gastroesophageal reflux disease)    Hiatal hernia    Prediabetes    Stage 3a chronic kidney disease     On long term drug therapy    Allergic rhinitis    History of colonic polyps    Osteopenia    Palpitations    Polyp of colon    Rosacea    Overweight (BMI 25.0-29.9)    Primary osteoarthritis of both knees    Coronary artery disease involving native coronary artery of native heart with angina pectoris    Hypokalemia    Anemia    Hypertensive heart disease without  heart failure    Other long term (current) drug therapy             Current Meds:    Outpatient Medications Marked as Taking for the 11/15/22 encounter (Telemedicine Visit) with Satira Mccallum, MD   Medication Sig Dispense Refill    aspirin 81 MG EC tablet Take 1 tablet (81 mg) by mouth daily      CALCIUM PO Take by mouth      cetirizine (ZyrTEC) 10 MG tablet every 24 hours      clopidogrel (PLAVIX) 75 mg tablet Take 1 tablet (75 mg) by mouth      levothyroxine (SYNTHROID) 75 MCG tablet Take 1 tablet (75 mcg) by mouth daily Except none on Sunday 90 tablet 0    metoprolol tartrate (LOPRESSOR) 25 MG tablet 1 tablet (25 mg) 2 (two) times daily      Multiple Vitamins-Minerals (MULTIVITAMIN ADULTS 50+ PO) multivitamin      pantoprazole (PROTONIX) 40 MG tablet Take 1 tablet (40 mg) by mouth daily 90 tablet 3    rosuvastatin (CRESTOR) 20 MG tablet Take 1 tablet (20 mg) by mouth nightly 90 tablet 3    [DISCONTINUED] amoxicillin-clavulanate (AUGMENTIN) 875-125 MG per tablet Take 1 tablet by mouth 2 (two) times daily            Allergies:    Allergies   Allergen Reactions    Albuterol Other (See Comments)     Tongue burning    Flagyl [Metronidazole]     Tape              Past Surgical History:    Past Surgical History:   Procedure Laterality Date    Back/Spine surgery  06/15/1983    CARDIAC CATHETERIZATION  01/09/2021    positive for restenosis    CAROTID STENT      COLONOSCOPY, DIAGNOSTIC (SCREENING)  07/06/2018    Repeat in 5 years.     COLONOSCOPY, DIAGNOSTIC (SCREENING)  04/14/2013    Normal.     ESOPHAGOSCOPY, RIGID, TRANSORAL  2016    normal, done x2    FOOT SURGERY  Left 05/14/2010    Left foot.     Oral/Dental surgery  12/12/2017    OTHER SURGICAL HISTORY  06/14/2016    heart work up, stress test negative.    WRIST FRACTURE SURGERY Left 04/14/2013    Left wrist fracture.            Family History:    Family History   Problem Relation Age of Onset    Stroke Mother         Cerebrovascular accident.     Alzheimer's disease Mother     Heart disease Father     Stroke Father         Cerebrovascular accident.     Heart disease Brother     Other Brother         Heart valve replacement.     Colon cancer Paternal Grandmother         Malignant tumor of colon.            Social History:    Social History     Socioeconomic History    Marital status: Single   Tobacco Use    Smoking status: Former     Current packs/day: 0.00     Average packs/day: 1 pack/day for 14.0 years (14.0 ttl pk-yrs)     Types: Cigarettes     Start date: 1969  Quit date: 9     Years since quitting: 41.4    Smokeless tobacco: Never    Tobacco comments:     Most recent tobacco use screening: 09/30/2017.     Vaping Use    Vaping status: Never Used   Substance and Sexual Activity    Alcohol use: Yes     Comment: One drink or less per week.     Drug use: Not Currently    Sexual activity: Not Currently   Social History Narrative    Exercise: walking in the house    Lifetime fitness            Nutrition: has allowed foodlist 06/2020    Wt was 184lbs 11/2019    Check wt daily        Live with daughter and son-inlaw    3 grandchildren        Has 2 children        2 brothers and 2 sisters        Has good support system        Has medical living will 11/18/2020        Enjoys Clinical biochemist, enjoys getting  together with friends     Social Determinants of Health     Financial Resource Strain: Low Risk  (12/16/2020)    Received from [facility]    Overall Financial Resource Strain (CARDIA)     Difficulty of Paying Living Expenses: Not hard at all   Stress: No Stress Concern Present (12/16/2020)    Received from [facility] - Occupational Stress Questionnaire     Feeling of Stress : Not at all    Received from [facility]    Social Network    Received from [facility]    HITS                  Vital Signs:    Temp 98.6 F (37 C)   Ht 1.626 m (5\' 4" )   Wt 76.2 kg (168 lb)   BMI 28.84 kg/m          ROS:    Review of Systems           Physical Exam:    Physical Exam   GENERAL APPEARANCE: alert, in no acute distress, pleasant, well nourished.   HEAD: normal appearance  EYES: no discharge  EARS: normal hearing  NECK/THYROID: appearance -supple  PSYCH: appropriate affect, appropriate mood, normal speech, normal attention        Assessment:    1. Conductive hearing loss, bilateral  - Ambulatory referral to ENT; Future    2. Mixed hyperlipidemia    3. Coronary artery disease involving native coronary artery of native heart with angina pectoris    4. Stage 3a chronic kidney disease  - Basic Metabolic Panel; Future    5. Acquired hypothyroidism  - TSH; Future            Plan:    Plan nonfasting labs next week  Continue current medicines  Side effect of meds discussed  Keeing low protonix, uses pepcid  Do healthy , high fiber, low cholesterol, low carb diet  Exercise most days  Plan for wellness visit every year  Follow up visit with [name] in 2months          Follow-up:    Return for AMW in 01/2023 as planned, fasting.  Satira Mccallum, MD

## 2022-12-04 ENCOUNTER — Other Ambulatory Visit: Payer: Self-pay | Admitting: Family Medicine

## 2022-12-06 ENCOUNTER — Other Ambulatory Visit (FREE_STANDING_LABORATORY_FACILITY): Payer: Medicare Other

## 2022-12-06 DIAGNOSIS — E039 Hypothyroidism, unspecified: Secondary | ICD-10-CM

## 2022-12-06 DIAGNOSIS — N1831 Chronic kidney disease, stage 3a: Secondary | ICD-10-CM

## 2022-12-06 DIAGNOSIS — E782 Mixed hyperlipidemia: Secondary | ICD-10-CM

## 2022-12-06 LAB — BASIC METABOLIC PANEL
Anion Gap: 10 (ref 5.0–15.0)
BUN: 19 mg/dL (ref 7–21)
CO2: 25 mEq/L (ref 17–29)
Calcium: 9.3 mg/dL (ref 7.9–10.2)
Chloride: 107 mEq/L (ref 99–111)
Creatinine: 1.1 mg/dL — ABNORMAL HIGH (ref 0.4–1.0)
GFR: 52.4 mL/min/{1.73_m2} — ABNORMAL LOW (ref >=60.0)
Glucose: 101 mg/dL — ABNORMAL HIGH (ref 70–100)
Hemolysis Index: 4 Index
Potassium: 5.1 mEq/L (ref 3.5–5.3)
Sodium: 142 mEq/L (ref 135–145)

## 2022-12-06 LAB — TSH: TSH: 1.4 u[IU]/mL (ref 0.35–4.94)

## 2022-12-06 LAB — LIPID PANEL
Cholesterol / HDL Ratio: 2.6 Index
Cholesterol: 148 mg/dL (ref <=199)
HDL: 56 mg/dL (ref >=40)
LDL Calculated: 69 mg/dL (ref 0–129)
Triglycerides: 115 mg/dL (ref 34–149)
VLDL Calculated: 23 mg/dL (ref 10–40)

## 2022-12-13 ENCOUNTER — Other Ambulatory Visit (INDEPENDENT_AMBULATORY_CARE_PROVIDER_SITE_OTHER): Payer: Self-pay | Admitting: Family Medicine

## 2022-12-13 DIAGNOSIS — E039 Hypothyroidism, unspecified: Secondary | ICD-10-CM

## 2022-12-13 MED ORDER — LEVOTHYROXINE SODIUM 75 MCG PO TABS
75.0000 ug | ORAL_TABLET | Freq: Every day | ORAL | 3 refills | Status: AC
Start: 2022-12-13 — End: ?

## 2023-02-04 ENCOUNTER — Encounter (INDEPENDENT_AMBULATORY_CARE_PROVIDER_SITE_OTHER): Payer: Self-pay | Admitting: Family Medicine

## 2023-02-04 ENCOUNTER — Ambulatory Visit (INDEPENDENT_AMBULATORY_CARE_PROVIDER_SITE_OTHER): Payer: Medicare Other | Admitting: Family Medicine

## 2023-02-04 VITALS — BP 132/82 | HR 76 | Resp 12 | Ht 63.5 in | Wt 165.8 lb

## 2023-02-04 DIAGNOSIS — K449 Diaphragmatic hernia without obstruction or gangrene: Secondary | ICD-10-CM

## 2023-02-04 DIAGNOSIS — M170 Bilateral primary osteoarthritis of knee: Secondary | ICD-10-CM

## 2023-02-04 DIAGNOSIS — K219 Gastro-esophageal reflux disease without esophagitis: Secondary | ICD-10-CM

## 2023-02-04 DIAGNOSIS — F411 Generalized anxiety disorder: Secondary | ICD-10-CM

## 2023-02-04 DIAGNOSIS — E782 Mixed hyperlipidemia: Secondary | ICD-10-CM

## 2023-02-04 DIAGNOSIS — I119 Hypertensive heart disease without heart failure: Secondary | ICD-10-CM

## 2023-02-04 DIAGNOSIS — K635 Polyp of colon: Secondary | ICD-10-CM

## 2023-02-04 DIAGNOSIS — M8588 Other specified disorders of bone density and structure, other site: Secondary | ICD-10-CM

## 2023-02-04 DIAGNOSIS — L719 Rosacea, unspecified: Secondary | ICD-10-CM

## 2023-02-04 DIAGNOSIS — I25119 Atherosclerotic heart disease of native coronary artery with unspecified angina pectoris: Secondary | ICD-10-CM

## 2023-02-04 DIAGNOSIS — Z79899 Other long term (current) drug therapy: Secondary | ICD-10-CM

## 2023-02-04 DIAGNOSIS — R7303 Prediabetes: Secondary | ICD-10-CM

## 2023-02-04 DIAGNOSIS — J301 Allergic rhinitis due to pollen: Secondary | ICD-10-CM

## 2023-02-04 DIAGNOSIS — N1831 Chronic kidney disease, stage 3a: Secondary | ICD-10-CM

## 2023-02-04 DIAGNOSIS — Z0001 Encounter for general adult medical examination with abnormal findings: Secondary | ICD-10-CM

## 2023-02-04 DIAGNOSIS — E559 Vitamin D deficiency, unspecified: Secondary | ICD-10-CM

## 2023-02-04 DIAGNOSIS — E039 Hypothyroidism, unspecified: Secondary | ICD-10-CM

## 2023-02-04 NOTE — Progress Notes (Signed)
[facility] Christine Bridges FAMILY [facility] -     Christine Bridges is a 72 y.o. female who presents today for the following Medicare Wellness Visit:  Patient is fasting today.  Declined shingrix vaccine.  Will check for tdap at pharmacy.  Last mammogram- 10/05/2022  Dexa- 12/04/2022  Colonoscopy- 01/21/2022    []  Initial Preventive Physical Exam (IPPE) - "Welcome to Medicare" preventive visit (Vision Screening required)   []  Annual Wellness Visit - Initial  [x]  Annual Wellness Visit - Subsequent       Health Risk Assessment:   During the past month, how would you rate your general health?:  Poor  Which of the following tasks can you do without assistance - drive or take the bus alone; shop for groceries or clothes; prepare your own meals; do your own housework/laundry; handle your own finances/pay bills; eat, bathe or get around your home?: Drive or take the bus alone, Prepare your own meals, Handle your own finances/pay bills, Shop for groceries or clothes, Do your own housework/laundry, Eat, bathe, dress or get around your home  Which of the following problems have you been bothered by in the past month - dizzy when standing up; problems using the phone; feeling tired or fatigued; moderate or severe body pain?: Feeling tired or fatigued  Do you exercise for about 20 minutes 3 or more days per week?:   During the past month was someone available to help if you needed and wanted help?  For example, if you felt nervous, lonely, got sick and had to stay in bed, needed someone to talk to, needed help with daily chores or needed help just taking care of yourself.:    Do you always wear a seat belt?: Yes  Do you have any trouble taking medications the way you have been told to take them?: No  Have you been given any information that can help you with keeping track of your medications?: No  Do you have trouble paying for your medications?: No  Have you been given any information that can help you with hazards in  your house, such as scatter rugs, furniture, etc?: No  Do you feel unsteady when standing or walking?: Yes  Do you worry about falling?: Yes  Have you fallen two or more times in the past year?: No  Did you suffer any injuries from your falls in the past year?: No     Care Team:   Patient Care Team:  Satira Mccallum, MD as PCP - General (Family Medicine)  Lonia Farber, MD as Consulting Physician (Medical Oncology)  Gaynelle Arabian, MD as Consulting Physician (Interventional Cardiology)      Hospitalizations:   Hospitalization within past year: [x]  No  [x]  Yes     Diagnosis:      Screenings:       11/18/2020 02/02/2022 02/04/2023   Ambulatory Screenings   Falls Risk: De [place] more than 2 times in past year N N    N N    Y   Falls Risk: Suffer any injuries? N N    N N    Y   Depression: PHQ2 Total Score 0 1 1   Depression: PHQ9 Total Score 0 1 1       Multiple values from one day are sorted in reverse-chronological order        Substance Use Disorder Screen:  In the past year, how often have you used the following?  1) Alcohol (For men, 5 or more drinks a day. For  women, 4 or more drinks a day)  []  Never [x]  Once or Twice []  Monthly []  Weekly []  Daily or Almost Daily  2) Tobacco Products  [x]  Never []  Once or Twice []  Monthly []  Weekly []  Daily or Almost Daily  3) Prescription Drugs for Non-Medical Reasons  [x]  Never []  Once or Twice []  Monthly []  Weekly []  Daily or Almost Daily  4) Illegal Drugs  [x]  Never []  Once or Twice []  Monthly []  Weekly []  Daily or Almost Daily             Functional Ability/Level of Safety:   Falls Risk/Home Safety Assessment:  ( see HRA and Screenings sections for additional assessment)  Home Safety: [x]  Stair handrails  [x]  Skid-resistant rugs/remove throw rugs   [x]  Grab bars  [x]  Clear pathways between rooms  [x]  Proper lighting stairs/ bathrooms/bedrooms  Get Up and Go (optional):  [x]   <20 secs  []   >20 secs    []   High risk for falls - Home Safety/Falls Risk Precautions reviewed with  pt/family    Hearing Assessment:  Concerns for hearing loss: [x]  Yes  []   No  Hearing aids:   []   Right  []   Left  []   Bilateral   []   None  Whisper Test (optional):  []  Normal  []   Slightly decreased  []   Significantly decreased    Exercise:  Frequency:  []   No formal exercise  [x]   1-2x/wk  []   3-4x/wk  []   >4x/wk  Duration:  []   15-30 mins/day  []   30-45 mins/day  []   45+ mins/day  Intensity:  []   Light  []   Moderate  []   Heavy        Activities of Daily Living:   ADL's Independent Minimal  Assistance Moderate  Assistance Total   Assistance   Bathing [x]  []  []  []    Dressing [x]  []  []  []    Mobility   [x]  []  []  []    Transfer [x]  []  []  []    Eating [x]  []  []  []    Toileting [x]  []  []  []      IADL's Independent Minimal  Assistance Moderate  Assistance Total   Assistance   Phone [x]  []  []  []    Housekeeping [x]  []  []  []    Laundry [x]  []  []  []    Transportation [x]  []  []  []    Medications [x]  []  []  []    Finances [x]  []  []  []       ADL assistance: [x]  No assistance needed  []  Spouse  []  Sibling  []  Son   []  Daughter []  Children  []  Home Health Aide []  Other:       Advance Care Planning:   Discussion of Advance Directives:   []  Advance Directive in chart  [x]  Advance Directive not in chart - requested to provide []  No Advance Directive.  Form Provided  []  No Advance Directive.  Pt declines. []  Not addressed today  []  Other:     Exam:   BP 132/82   Pulse 76   Resp 12   Ht 1.613 m (5' 3.5")   Wt 75.2 kg (165 lb 12.8 oz)   BMI 28.91 kg/m      Physical Exam          Evaluation of Cognitive Function:   Mood/affect: [x]  Appropriate  []   Other:   Appearance: [x]  Neatly groomed  [x]  Adequately nourished  []  Other:  Family member/caregiver input: []  Present - no concerns  []   Not present in  room  []  Present - concerns:    Cognitive Assessment:  Mini-Cog Result (three word registration- banana, sunrise, chair / clock drawing):   []   > 3 points - negative screen for dementia   [x]  3 recalled words - negative screen for dementia   []   1-2 recalled words and normal clock draw - negative for cognitive impairment   []  1-2 recalled words and abnormal clock draw - positive for cognitive impairment   []  0 recalled words - positive for cognitive impairment         Assessment/Plan:   1. Encounter for routine adult health examination with abnormal findings    2. Osteopenia of spine    3. Mixed hyperlipidemia    4. Coronary artery disease involving native coronary artery of native heart with angina pectoris    5. Hypertensive heart disease without heart failure    6. Hiatal hernia    7. Seasonal allergic rhinitis due to pollen    8. Gastroesophageal reflux disease, unspecified whether esophagitis present    9. Hyperplastic colonic polyp, unspecified part of colon    10. Acquired hypothyroidism    11. Primary osteoarthritis of both knees    12. Stage 3a chronic kidney disease    13. GAD (generalized anxiety disorder)    14. Vitamin D deficiency    15. Prediabetes    16. On long term drug therapy    17. Rosacea           Satira Mccallum, MD    02/04/2023     The following sections were reviewed this encounter by the provider:   Tobacco  Allergies  Meds  Problems  Med Hx  Surg Hx  Fam Hx          History:   Problem List[1]   Medical History[2]  Past Surgical History:   Procedure Laterality Date    Back/Spine surgery  06/15/1983    CARDIAC CATHETERIZATION  01/09/2021    positive for restenosis    CAROTID STENT      COLONOSCOPY W/ BIOPSIES  01/21/2022    COLONOSCOPY, DIAGNOSTIC (SCREENING)  07/06/2018    Repeat in 5 years.     COLONOSCOPY, DIAGNOSTIC (SCREENING)  04/14/2013    Normal.     ESOPHAGOSCOPY, RIGID, TRANSORAL  2016    normal, done x2    FOOT SURGERY Left 05/14/2010    Left foot.     Oral/Dental surgery  12/12/2017    OTHER SURGICAL HISTORY  06/14/2016    heart work up, stress test negative.    WRIST FRACTURE SURGERY Left 04/14/2013    Left wrist fracture.      Allergies[3]   Medications Taking[4]  Social History[5]   Family History   Problem Relation  Age of Onset    Stroke Mother         Cerebrovascular accident.     Alzheimer's disease Mother     Heart disease Father     Stroke Father         Cerebrovascular accident.     Heart disease Brother     Other Brother         Heart valve replacement.     Colon cancer Paternal Grandmother         Malignant tumor of colon.            ===================================================================    Additional Documentation:                       [  1]   Patient Active Problem List  Diagnosis    GAD (generalized anxiety disorder)    Vitamin D deficiency    Mixed hyperlipidemia    Hypothyroidism    GERD (gastroesophageal reflux disease)    Hiatal hernia    Prediabetes    Stage 3a chronic kidney disease    On long term drug therapy    Allergic rhinitis    History of colonic polyps    Osteopenia    Polyp of colon    Rosacea    Overweight (BMI 25.0-29.9)    Primary osteoarthritis of both knees    Coronary artery disease involving native coronary artery of native heart with angina pectoris    Hypertensive heart disease without heart failure    Other long term (current) drug therapy   [2]   Past Medical History:  Diagnosis Date    Abnormal vision     Has had an annual eye exam in 2016 and wears glasses which are new.     Anemia 12/16/2020    Benign essential hypertension     Chest tightness 02/21/2013    Hypokalemia 12/16/2020    Osteoporosis     Palpitations 02/21/2013    Rectal bleeding 02/21/2013   [3]   Allergies  Allergen Reactions    Albuterol Other (See Comments)     Tongue burning    Flagyl [Metronidazole]     Tape    [4]   Outpatient Medications Marked as Taking for the 02/04/23 encounter (Office Visit) with Satira Mccallum, MD   Medication Sig Dispense Refill    aspirin 81 MG EC tablet Take 1 tablet (81 mg) by mouth daily      CALCIUM PO Take by mouth      cetirizine (ZyrTEC) 10 MG tablet every 24 hours      clopidogrel (PLAVIX) 75 mg tablet Take 1 tablet (75 mg) by mouth      famotidine (PEPCID) 20 MG tablet Take 1  tablet (20 mg) by mouth nightly as needed      levothyroxine (SYNTHROID) 75 MCG tablet Take 1 tablet (75 mcg) by mouth daily Except none on Sunday 90 tablet 3    metoprolol tartrate (LOPRESSOR) 25 MG tablet 1 tablet (25 mg) 2 (two) times daily      Multiple Vitamins-Minerals (MULTIVITAMIN ADULTS 50+ PO) multivitamin      pantoprazole (PROTONIX) 40 MG tablet Take 1 tablet (40 mg) by mouth daily 90 tablet 3    rosuvastatin (CRESTOR) 20 MG tablet Take 1 tablet (20 mg) by mouth nightly 90 tablet 3   [5]   Social History  Tobacco Use    Smoking status: Former     Current packs/day: 0.00     Average packs/day: 1 pack/day for 14.0 years (14.0 ttl pk-yrs)     Types: Cigarettes     Start date: 41     Quit date: 74     Years since quitting: 41.6    Smokeless tobacco: Never    Tobacco comments:     Most recent tobacco use screening: 09/30/2017.     Vaping Use    Vaping status: Never Used   Substance Use Topics    Alcohol use: Yes     Comment: rare    Drug use: Not Currently

## 2023-02-04 NOTE — Progress Notes (Signed)
Subjective:      Patient ID: Christine Bridges is a 72 y.o. female.    Chief Complaint:  Chief Complaint   Patient presents with    medicare wellness exam       HPI:  HPI    Ckd 52.4    Tsh on target      Cholesterol on target    Getting sick often    L knee fx healing    Helping sister in law with depression, she is better now    Feeling stressed     Problem List:  Problem List[1]    Current Medications:  Current Medications[2]    Allergies:  Allergies[3]    Past Medical History:  Medical History[4]    Past Surgical History:  Past Surgical History:   Procedure Laterality Date    Back/Spine surgery  06/15/1983    CARDIAC CATHETERIZATION  01/09/2021    positive for restenosis    CAROTID STENT      COLONOSCOPY W/ BIOPSIES  01/21/2022    COLONOSCOPY, DIAGNOSTIC (SCREENING)  07/06/2018    Repeat in 5 years.     COLONOSCOPY, DIAGNOSTIC (SCREENING)  04/14/2013    Normal.     ESOPHAGOSCOPY, RIGID, TRANSORAL  2016    normal, done x2    FOOT SURGERY Left 05/14/2010    Left foot.     Oral/Dental surgery  12/12/2017    OTHER SURGICAL HISTORY  06/14/2016    heart work up, stress test negative.    WRIST FRACTURE SURGERY Left 04/14/2013    Left wrist fracture.        Family History:  Family History   Problem Relation Age of Onset    Stroke Mother         Cerebrovascular accident.     Alzheimer's disease Mother     Heart disease Father     Stroke Father         Cerebrovascular accident.     Heart disease Brother     Other Brother         Heart valve replacement.     Colon cancer Paternal Grandmother         Malignant tumor of colon.        Social History:  Social History[5]     The following sections were reviewed this encounter by the provider:   Tobacco  Allergies  Meds  Problems  Med Hx  Surg Hx  Fam Hx         ROS:  Review of Systems    Vitals:  BP 132/82   Pulse 76   Resp 12   Ht 1.613 m (5' 3.5")   Wt 75.2 kg (165 lb 12.8 oz)   BMI 28.91 kg/m      Objective:     Physical Exam:  Physical Exam  Constitutional:        General: She is not in acute distress.  HENT:      Head: Atraumatic.      Right Ear: There is impacted cerumen.      Left Ear: Tympanic membrane, ear canal and external ear normal. There is no impacted cerumen.      Nose: No congestion or rhinorrhea.      Mouth/Throat:      Pharynx: No oropharyngeal exudate or posterior oropharyngeal erythema.   Eyes:      General:         Right eye: No discharge.         Left  eye: No discharge.   Cardiovascular:      Rate and Rhythm: Regular rhythm.      Heart sounds: Murmur heard.   Pulmonary:      Effort: Pulmonary effort is normal.      Breath sounds: Normal breath sounds. No wheezing.   Abdominal:      Tenderness: There is no abdominal tenderness.   Musculoskeletal:         General: No swelling or tenderness.      Cervical back: Neck supple.   Skin:     Findings: No lesion.   Neurological:      General: No focal deficit present.      Cranial Nerves: No cranial nerve deficit.      Coordination: Coordination abnormal.      Gait: Gait abnormal.   Psychiatric:         Behavior: Behavior normal.          Assessment:     1. Encounter for routine adult health examination with abnormal findings    2. Osteopenia of spine    3. Mixed hyperlipidemia    4. Coronary artery disease involving native coronary artery of native heart with angina pectoris    5. Hypertensive heart disease without heart failure    6. Hiatal hernia    7. Seasonal allergic rhinitis due to pollen    8. Gastroesophageal reflux disease, unspecified whether esophagitis present    9. Hyperplastic colonic polyp, unspecified part of colon    10. Acquired hypothyroidism    11. Primary osteoarthritis of both knees    12. Stage 3a chronic kidney disease    13. GAD (generalized anxiety disorder)    14. Vitamin D deficiency    15. Prediabetes    16. On long term drug therapy    17. Rosacea      Plan:     Continue current medicines  Side effect of meds discussed  Do healthy , high fiber, low cholesterol, low carb  diet  Exercise most days  Plan for wellness visit every year  Follow up visit with [name] in 3 months     Satira Mccallum, MD          [1]   Patient Active Problem List  Diagnosis    GAD (generalized anxiety disorder)    Vitamin D deficiency    Mixed hyperlipidemia    Hypothyroidism    GERD (gastroesophageal reflux disease)    Hiatal hernia    Prediabetes    Stage 3a chronic kidney disease    On long term drug therapy    Allergic rhinitis    History of colonic polyps    Osteopenia    Polyp of colon    Rosacea    Overweight (BMI 25.0-29.9)    Primary osteoarthritis of both knees    Coronary artery disease involving native coronary artery of native heart with angina pectoris    Hypertensive heart disease without heart failure    Other long term (current) drug therapy   [2]   Current Outpatient Medications   Medication Sig Dispense Refill    aspirin 81 MG EC tablet Take 1 tablet (81 mg) by mouth daily      CALCIUM PO Take by mouth      cetirizine (ZyrTEC) 10 MG tablet every 24 hours      clopidogrel (PLAVIX) 75 mg tablet Take 1 tablet (75 mg) by mouth      famotidine (PEPCID) 20 MG  tablet Take 1 tablet (20 mg) by mouth nightly as needed      levothyroxine (SYNTHROID) 75 MCG tablet Take 1 tablet (75 mcg) by mouth daily Except none on Sunday 90 tablet 3    metoprolol tartrate (LOPRESSOR) 25 MG tablet 1 tablet (25 mg) 2 (two) times daily      Multiple Vitamins-Minerals (MULTIVITAMIN ADULTS 50+ PO) multivitamin      pantoprazole (PROTONIX) 40 MG tablet Take 1 tablet (40 mg) by mouth daily 90 tablet 3    rosuvastatin (CRESTOR) 20 MG tablet Take 1 tablet (20 mg) by mouth nightly 90 tablet 3     No current facility-administered medications for this visit.   [3]   Allergies  Allergen Reactions    Albuterol Other (See Comments)     Tongue burning    Flagyl [Metronidazole]     Tape    [4]   Past Medical History:  Diagnosis Date    Abnormal vision     Has had an annual eye exam in 2016 and wears glasses  which are new.     Anemia 12/16/2020    Benign essential hypertension     Chest tightness 02/21/2013    Hypokalemia 12/16/2020    Osteoporosis     Palpitations 02/21/2013    Rectal bleeding 02/21/2013   [5]   Social History  Socioeconomic History    Marital status: Divorced   Occupational History    Occupation: retired, help grandchildren, housework   Tobacco Use    Smoking status: Former     Current packs/day: 0.00     Average packs/day: 1 pack/day for 14.0 years (14.0 ttl pk-yrs)     Types: Cigarettes     Start date: 89     Quit date: 1983     Years since quitting: 41.6    Smokeless tobacco: Never    Tobacco comments:     Most recent tobacco use screening: 09/30/2017.     Vaping Use    Vaping status: Never Used   Substance and Sexual Activity    Alcohol use: Yes     Comment: rare    Drug use: Not Currently    Sexual activity: Not Currently   Social History Narrative    retired, help grandchildren, housework        Exercise: walking in the house    Lifetime fitness            Nutrition: has allowed foodlist 06/2020    Wt was 184lbs 11/2019    Check wt daily        Live with daughter and son-inlaw    3 grandchildren        Has 2 children        2 brothers and 2 sisters        Has good support system        Has medical living will 11/18/2020        Enjoys Clinical biochemist, enjoys getting  together with friends     Social Determinants of Health     Financial Resource Strain: Low Risk  (12/16/2020)    Received from [facility], [facility]    Overall Financial Resource Strain (CARDIA)     Difficulty of Paying Living Expenses: Not hard at all   Stress: No Stress Concern Present (12/16/2020)    Received from [facility], [facility]    [employer] of Occupational Health - Occupational Stress Questionnaire     Feeling of Stress : Not  at all    Received from [facility], [facility]    Social Network    Received from [facility], [facility]    HITS

## 2023-02-04 NOTE — Patient Instructions (Signed)
MEDICARE WELLNESS PERSONAL PREVENTION PLAN   As part of the Medicare Wellness portion of your visit today, we are providing you with this personalized preventative plan of care. We have listed below some of the preventative services that are recommended for patients based upon their age and gender. These recommendations are taken directly from the [place] [employer] [place] [employer] (USPSTF) and the [employer] (ACIP).     Health Maintenance   Topic Date Due    Advance Directive on File  Has one    Shingrix Vaccine 50+ (2) 01/24/2020    COVID-19 Vaccine (9 - 2023-24 season) 08/21/2022    INFLUENZA VACCINE  01/13/2023    Tetanus Ten-Year  02/17/2023 (Originally 03/14/2021)    Colorectal Cancer Screening  07/07/2023    Statin Use  11/15/2023    FALLS RISK ANNUAL  02/04/2024    DEPRESSION SCREENING  02/04/2024    Medicare Annual Wellness Visit  02/04/2024    MAMMOGRAM  10/04/2024    HEPATITIS C SCREENING  Completed    DXA Scan  Completed    Pneumonia Vaccine Age 32+  Completed            Immunization History   Administered Date(s) Administered    COVID-19 mRNA 2023-2024 vaccine 12 years and above (Moderna) 50 mcg/0.5 mL 04/22/2022    COVID-19 mRNA BIVALENT vaccine 12 years and above (Moderna) 50 mcg/0.5 mL 09/24/2021    COVID-19 mRNA MONOVALENT vaccine PRIMARY SERIES 12 years and above (Moderna) 100 mcg/0.5 mL 07/16/2019, 07/19/2019, 08/13/2019, 08/16/2019, 05/02/2020, 02/04/2021    DTaP (diphtheria, tetanus, acellular pertussis) vaccine 03/15/2011    INFLUENZA TRIVALENT HIGH DOSE PF 65 YRS AND OLDER 04/29/2016, 04/24/2017, 03/30/2018, 03/21/2019, 04/14/2020    Influenza (Flu) vaccine 02/13/2015    Influenza vaccine quadrivalent MDCK, 6 months and older (FLUCELVAX), single-dose preservative free, 0.5 mL 04/14/2016, 03/14/2018    Influenza vaccine quadrivalent adujvanted 65 years and older (FLUAD) single dose 0.5 mL (PF) 03/21/2019    Influenza vaccine quadrivalent high-dose 65  years and older (FLUZONE HIGH-DOSE) single dose 0.7 mL (PF) 03/30/2020, 04/08/2021, 04/22/2022    Influenza vaccine quadrivalent recombinant 18 years and older (FLUBOK) single dose 0.5 mL (PF) 03/07/2015, 03/14/2018    Influenza vaccine, quadrivalent, 3 years and older (FLUZONE), single-dose preservative free, 0.5 mL 03/07/2015, 03/14/2018    Influenza vaccine, quadrivalent, 6 months and older (Afluria/Fluzone), multi-dose with preservative, 5 mL 04/14/2016, 03/14/2018    Influenza vaccine, quadrivalent, 6 months and older (Afluria/Fluzone), multi-dose, 5 mL 04/14/2016, 03/14/2018    Pneumococcal Conjugate 13-Valent 04/29/2016    Pneumococcal polysaccharide vaccine, 23 valent 04/14/2016    RSV vaccine (AREXVY), recombinant, RSVpreF3, adjuvant reconstituted, PF, 0.5 mL 05/19/2022    Tdap (tetanus, diphtheria reduced, acellular pertussis), adsorbed vaccine 06/14/2010, 03/15/2011        Your major risk factors:   Recommendations for improvement:      The list below includes many common screening recommendations but is not meant to be comprehensive. You may be eligible for other preventative services depending upon your personal risk factors.   Colorectal Cancer Screening - All adults age 11-75 should undergo periodic colorectal cancer screening. The decision to screen for colorectal cancer in adults aged 41 to 53 years should be an individual one,taking into account your overall health and prior screening history.   Breast Cancer Screening - Women age 39-74 should have mammograms every other year (please note that this recommendation may not be appropriate for every woman - your physician can  answer specific questions you may have). The USPSTF concludes that the current evidence is insufficient to assess the balance of benefits and harms of screening mammography in women aged 82 years or older.    Cervical Cancer Screening - Women over 74 do not require pap smears as long as prior screening has been normal and are not  otherwise at high risk for cervical cancer. For women aged 48 to 68 years, the USPSTF recommends screening every 3 years with cervical cytology alone, every 5 years with high-risk human papillomavirus (hrHPV) testing alone, or every 5 years with hrHPV testing in combination with cytology (cotesting).   Osteoporosis Screening -  The USPSTF recommends screening for osteoporosis with bone measurement testing to prevent osteoporotic fractures in women 65 years and older.  Hepatitis C Screening - Recommend screening for hepatitis C virus (HCV) infection in all adults aged 42 to 43 years.  Lung cancer Screening - Recommend annual screening for lung cancer with low-dose computed tomography (LDCT) in adults ages 19 to 34 years who have a 20 pack-year smoking history and currently smoke or have quit within the past 15 years.  Recommended Vaccinations   Influenza one dose annually   Tetanus/diphtheria one booster every 10 years   Zoster/Shingles - Shingrix two doses after age 44 (second dose given 2-6 months after first dose)  Pneumovax (PPSV23) one dose for adults aged ?65 years  Prevnar(PCV13) shared clinical decision-making is recommended regarding administration of this vaccine to persons aged ?65 years who do not have an immunocompromising condition, cerebrospinal fluid leak, or cochlear implant.     PERSONAL PREVENTION PLAN   Your Personal Prevention Plan is based on your overall health and your responses to the health questionnaire you completed. The following information is for you to review in addition to the recommendations, referrals, and tests we have discussed at your visit.     Physical Activity:   Physical activity can help you maintain a healthy weight, prevent or control illness, reduce stress, and sleep better. It can also help you improve your balance to avoid falls. Try to build up to and maintain a total of 30 minutes of activity each day. If you are able, try walking, doing yard or housework, and taking  the stairs more often. You can also strengthen your muscles with exercises done while sitting or lying down.   Emotional Health:   Feeling "down in the dumps" or anxious every now and then is a natural part of life. If this feeling lasts for a few weeks or more, talk with me as soon as possible. It could be a sign of a problem that needs treatment. There are many types of treatment available.   Falls:   You can reduce your risk of falling by making changes in your home. Remove items that may cause tripping, improve lighting, and consider installing grab bars.   Talk with me if you have problems with balance and walking. To prevent falls, you may need your vision, hearing, or blood pressure checked. Exercises to improve your strength and balance, or using a cane or walker, may help. Review your medicines with me at every visit, because some can affect balance. Please be sure to let me know if you fall or are fearful you may fall.   Urinary Leakage:   Urine leakage is common, but it is not a normal part of aging. Talk with me about any urine leakage so that the cause can be found and treated. Treatment can  include bladder training, exercises, medicine or surgery.   Pain:   We all have aches and pains at times, but chronic pain can change how you feel and live every day. Please talk with me about any symptoms of chronic pain so that we can determine how best to treat.   Sleep:   Getting a good night's sleep is vital to your health and well-being and can help prevent or manage health problems. Often, sleep can be improved by changing behaviors, including when you go to bed and what you do before bed. Sleep apnea can cause problems such as struggling to stay awake during the day. Please let me know if you would like to learn more about improving your sleep and/or think you may have sleep apnea.   Seat Belt:   Please remember to wear a seat belt when driving or riding in a vehicle. It is one of the most important things  you can do to stay safe in a car.   Nutrition:   Remember to eat plenty of fruits, vegetables, whole grains, and dairy. Drink at least 64 ounces (8 full glasses) of water a day, unless you have been advised to limit fluids.   Alcohol:   Alcohol can have a greater effect on older people, who may feel its effects at a lower amount. Older people should limit alcoholic drinks (no more than one a day for women and no more than two a day for men). Please let me know if alcohol use becomes a problem.   Tobacco:   Not smoking or using other forms of tobacco is one of the most important things you can do for your health. Here is some more information about the importance of quitting smoking and how to quit smoking - [employer].com.pt  Advance Directives:   There may come a time when medical decisions need to be made on your behalf. Please talk with your family, and with me, about your wishes. It is important to provide information about your decisions, and any formal advance directives, for your medical record. Here is additional information on advanced directives - http://wilson-mayo.[email or web address]  Additional Support:   Sometimes it can be challenging to manage all aspects of daily life. Finding the right support can help you maintain or improve your health and independence. Please let me know if you would like to talk further about finding resources to assist you.

## 2023-03-30 ENCOUNTER — Encounter (INDEPENDENT_AMBULATORY_CARE_PROVIDER_SITE_OTHER): Payer: Self-pay | Admitting: Family Medicine

## 2023-03-31 NOTE — Progress Notes (Signed)
 Pls advise pt (help make appt)she needs visit soon with anyone or urgent care until I can see her. TY sorry I work 50% .

## 2023-04-01 NOTE — Progress Notes (Signed)
 Spoke to patient and daughter Constance Holster,  patient wants to see only Dr. Jonathon Bellows. Patient scheduled on 10/28 at 9:30 VV for anxiety.

## 2023-04-11 ENCOUNTER — Telehealth (INDEPENDENT_AMBULATORY_CARE_PROVIDER_SITE_OTHER): Payer: Medicare Other | Admitting: Family Medicine

## 2023-04-11 ENCOUNTER — Encounter (INDEPENDENT_AMBULATORY_CARE_PROVIDER_SITE_OTHER): Payer: Self-pay | Admitting: Family Medicine

## 2023-04-11 VITALS — Ht 64.0 in | Wt 168.0 lb

## 2023-04-11 DIAGNOSIS — F330 Major depressive disorder, recurrent, mild: Secondary | ICD-10-CM

## 2023-04-11 DIAGNOSIS — I25119 Atherosclerotic heart disease of native coronary artery with unspecified angina pectoris: Secondary | ICD-10-CM

## 2023-04-11 DIAGNOSIS — K219 Gastro-esophageal reflux disease without esophagitis: Secondary | ICD-10-CM

## 2023-04-11 DIAGNOSIS — K449 Diaphragmatic hernia without obstruction or gangrene: Secondary | ICD-10-CM

## 2023-04-11 DIAGNOSIS — F411 Generalized anxiety disorder: Secondary | ICD-10-CM

## 2023-04-11 NOTE — Progress Notes (Signed)
[facility] FAMILY MEDIC[facility] - [place]                       Date of Virtual Visit: 04/11/2023 5:04 PM        Patient ID: Christine Bridges is a 72 y.o. female.  Attending Physician: Satira Mccallum, MD       Telemedicine Eligibility:    State Location:  [x]  [place]  []  [facility] []  [place]  []  Other:    Physical Location:  [x]  Home  []         []        []          []  Other:    Patient Identity Verification:  [x]  State Issued ID  []  Insurance Eligibility Check  []  Other:    Physical Address Verification: (for 911)  [x]  Yes  []  No    Personal identity shared with patient:  [x]  Yes  []  No    Education on nature of video visit shared with patient:  [x]  Yes  []  No    Emergency plan agreed upon with patient:  [x]  Yes  []  No    If the patient had not had this virtual visit, what would they have done?  []         []         []        []          []  Other:    Visit terminated since not appropriate for virtual care:  [x]  N/A  []  Reason:         Chief Complaint:    Chief Complaint   Patient presents with    Anxiety               HPI:    HPI  72 y/o female presents today for video visit.  Verbal consent given to bill insurance.  Patient currently in the state of [place].    Daughter noticing anxiety is worsening.  Patient has never been on medication in the past.  Reports having a "panic attack" on 03/29/2023.  Was in a rush yesterday and started crying and had chest pain.   Did not find keys and missed meeting.    Gad- 8  Phq-7    Sister in law  is working now. She needs to pay for cost of living with them    Husband sick now    Helps with grandchildren and feels the responsibility of all family    Tried toimprove exercise    When panic and chest pain happens hard to know if cad or anxiety only    Trying to sleep better            Problem List:    Problem List[1]          Current Meds:    Medications Taking[2]       Allergies:    Allergies[3]          Past Surgical History:    Past Surgical  History[4]        Family History:    Family History[5]        Social History:    Social History[6]               Vital Signs:    Ht 1.626 m (5\' 4" )   Wt 76.2 kg (168 lb)   BMI 28.84 kg/m          ROS:  Review of Systems           Physical Exam:    Physical Exam   GENERAL APPEARANCE: alert, in no acute distress, pleasant, well nourished.   HEAD: normal appearance  EYES: no discharge  EARS: normal hearing  NECK/THYROID: appearance -supple  PSYCH: appropriate affect, appropriate mood, normal speech, normal attention        Assessment:    1. GAD (generalized anxiety disorder)  - Referral to Adult Psychiatry & Behavioral Health; Future  - Follow Up In Primary Care; Future    2. Gastroesophageal reflux disease, unspecified whether esophagitis present    3. Coronary artery disease involving native coronary artery of native heart with angina pectoris    4. Hiatal hernia    5. Mild episode of recurrent major depressive disorder  - Follow Up In Primary Care; Future            Plan:      Counselling resources:    1.  Check out www.betterhelp.com to be matched with a right counsellor for you.    2. Behavioral health services: [facility]       [facility] location: [phone]      3.[facility] [place]: [phone]      4. [name] : [phone]       5. Psychology today.com- online and in person    SAMSHA.gov  - [place] Dept of Health and [employer] provided. Can locate services via this website. This is for addiction/substance abuse problems.           The following activities were performed on the date of service:  preparing to see the patient: - chart review   obtaining and/or reviewing the separately obtained history  performing a medically appropriate examination and/or evaluation   counseling and educating the patient/family/caregiver  referring and communicating with other health care professionals (when not  separately reported)     Total time spent performing activities on date of service:  45  minutes    Discussed meds and side effects  Room to make in areas below and wants to do these first:  Few minutes in am and pm quiet meditative time and prayer  Organize the day  Try to exercise 30 min most days  Try to have 30-60 mins doing activities for herself  Protect sleep time 8-9 hrs /day non-negotiable  Give 60 min of winding time with no TV, dim lights  Recheck in 5 weeks  Start counselling asap             Follow-up:    No follow-ups on file.         Satira Mccallum, MD                     [1]   Patient Active Problem List  Diagnosis    GAD (generalized anxiety disorder)    Vitamin D deficiency    Mixed hyperlipidemia    Hypothyroidism    GERD (gastroesophageal reflux disease)    Hiatal hernia    Prediabetes    Stage 3a chronic kidney disease    On long term drug therapy    Allergic rhinitis    History of colonic polyps    Osteopenia    Polyp of colon    Rosacea    Overweight (BMI 25.0-29.9)    Primary osteoarthritis of both knees  Coronary artery disease involving native coronary artery of native heart with angina pectoris    Hypertensive heart disease without heart failure    Other long term (current) drug therapy   [2]   Outpatient Medications Marked as Taking for the 04/11/23 encounter (Telemedicine Visit) with Satira Mccallum, MD   Medication Sig Dispense Refill    aspirin 81 MG EC tablet Take 1 tablet (81 mg) by mouth daily      CALCIUM PO Take by mouth      cetirizine (ZyrTEC) 10 MG tablet every 24 hours      clopidogrel (PLAVIX) 75 mg tablet Take 1 tablet (75 mg) by mouth      famotidine (PEPCID) 20 MG tablet Take 1 tablet (20 mg) by mouth nightly as needed      levothyroxine (SYNTHROID) 75 MCG tablet Take 1 tablet (75 mcg) by mouth daily Except none on Sunday 90 tablet 3    metoprolol tartrate (LOPRESSOR) 25 MG tablet 1 tablet (25 mg) 2 (two) times daily      Multiple Vitamins-Minerals (MULTIVITAMIN ADULTS 50+ PO)  multivitamin      pantoprazole (PROTONIX) 40 MG tablet Take 1 tablet (40 mg) by mouth daily 90 tablet 3    rosuvastatin (CRESTOR) 20 MG tablet Take 1 tablet (20 mg) by mouth nightly 90 tablet 3   [3]   Allergies  Allergen Reactions    Albuterol Other (See Comments)     Tongue burning    Flagyl [Metronidazole]     Tape    [4]   Past Surgical History:  Procedure Laterality Date    Back/Spine surgery  06/15/1983    CARDIAC CATHETERIZATION  01/09/2021    positive for restenosis    CAROTID STENT      COLONOSCOPY W/ BIOPSIES  01/21/2022    COLONOSCOPY, DIAGNOSTIC (SCREENING)  07/06/2018    Repeat in 5 years.     COLONOSCOPY, DIAGNOSTIC (SCREENING)  04/14/2013    Normal.     ESOPHAGOSCOPY, RIGID, TRANSORAL  2016    normal, done x2    FOOT SURGERY Left 05/14/2010    Left foot.     Oral/Dental surgery  12/12/2017    OTHER SURGICAL HISTORY  06/14/2016    heart work up, stress test negative.    WRIST FRACTURE SURGERY Left 04/14/2013    Left wrist fracture.    [5]   Family History  Problem Relation Name Age of Onset    Stroke Mother          Cerebrovascular accident.     Alzheimer's disease Mother      Heart disease Father      Stroke Father          Cerebrovascular accident.     Heart disease Brother      Other Brother          Heart valve replacement.     Colon cancer Paternal Grandmother          Malignant tumor of colon.    [6]   Social History  Socioeconomic History    Marital status: Divorced   Occupational History    Occupation: retired, help grandchildren, housework   Tobacco Use    Smoking status: Former     Current packs/day: 0.00     Average packs/day: 1 pack/day for 14.0 years (14.0 ttl pk-yrs)     Types: Cigarettes     Start date: 53     Quit date: 1983     Years since quitting:  41.8    Smokeless tobacco: Never    Tobacco comments:     Most recent tobacco use screening: 09/30/2017.     Vaping Use    Vaping status: Never Used   Substance and Sexual Activity    Alcohol use: Yes     Comment: rare    Drug use: Not  Currently    Sexual activity: Not Currently   Social History Narrative    retired, help grandchildren, housework        Exercise: walking in the house    Lifetime fitness            Nutrition: has allowed foodlist 06/2020    Wt was 184lbs 11/2019    Check wt daily        Live with daughter and son-inlaw    3 grandchildren        Has 2 children        2 brothers and 2 sisters        Has good support system        Has medical living will 11/18/2020        Enjoys Clinical biochemist, enjoys getting  together with friends     Social Drivers of Health     Financial Resource Strain: Low Risk  (12/16/2020)    Received from [facility], [facility]    Overall Financial Resource Strain (CARDIA)     Difficulty of Paying Living Expenses: Not hard at all   Stress: No Stress Concern Present (12/16/2020)    Received from [employer], [facility]    [employer] of Occupational Health - Occupational Stress Questionnaire     Feeling of Stress : Not at all    Received from [employer], [facility]    Social Network    Received from [employer], [facility]    HITS

## 2023-04-14 ENCOUNTER — Encounter (INDEPENDENT_AMBULATORY_CARE_PROVIDER_SITE_OTHER): Payer: Self-pay

## 2023-05-05 ENCOUNTER — Telehealth (INDEPENDENT_AMBULATORY_CARE_PROVIDER_SITE_OTHER): Payer: Self-pay | Admitting: Family Medicine

## 2023-05-05 NOTE — Telephone Encounter (Signed)
 Refill Request:    Last Office Visit (Less than 6 months): 04-11-23    (If greater than 6 months- NEEDS OV SCHEDULED)    Scheduled or declined: n  Reason for declining OV: n    Prescriber: KC  Medication:   Name: Rosuvastatin 20mg    Directions: Daily  How

## 2023-05-06 ENCOUNTER — Other Ambulatory Visit (INDEPENDENT_AMBULATORY_CARE_PROVIDER_SITE_OTHER): Payer: Self-pay | Admitting: Family Medicine

## 2023-05-06 DIAGNOSIS — E782 Mixed hyperlipidemia: Secondary | ICD-10-CM

## 2023-05-06 NOTE — Telephone Encounter (Signed)
Temp.refill sent to local [facility] pharmacy

## 2023-05-06 NOTE — Telephone Encounter (Signed)
Patient called to follow up om her medication refill request for rosuvastatin. . Informed that prescribing provider is not in. Per patient she is leaving tomorrow hoping to get the refill today. Patient also states she is out of medication. Please advise.

## 2023-05-09 ENCOUNTER — Encounter (INDEPENDENT_AMBULATORY_CARE_PROVIDER_SITE_OTHER): Payer: Self-pay

## 2023-05-19 ENCOUNTER — Encounter (INDEPENDENT_AMBULATORY_CARE_PROVIDER_SITE_OTHER): Payer: Self-pay | Admitting: Family Medicine

## 2023-05-19 ENCOUNTER — Ambulatory Visit (INDEPENDENT_AMBULATORY_CARE_PROVIDER_SITE_OTHER): Payer: Medicare Other | Admitting: Family Medicine

## 2023-05-19 VITALS — BP 126/72 | HR 70 | Temp 97.0°F | Resp 12 | Wt 169.0 lb

## 2023-05-19 DIAGNOSIS — I25119 Atherosclerotic heart disease of native coronary artery with unspecified angina pectoris: Secondary | ICD-10-CM

## 2023-05-19 DIAGNOSIS — R7303 Prediabetes: Secondary | ICD-10-CM

## 2023-05-19 DIAGNOSIS — E782 Mixed hyperlipidemia: Secondary | ICD-10-CM

## 2023-05-19 DIAGNOSIS — F330 Major depressive disorder, recurrent, mild: Secondary | ICD-10-CM

## 2023-05-19 DIAGNOSIS — F411 Generalized anxiety disorder: Secondary | ICD-10-CM

## 2023-05-19 LAB — POCT HEMOGLOBIN A1C: POCT Hgb A1C: 5.5 % (ref 3.9–5.9)

## 2023-05-19 MED ORDER — ROSUVASTATIN CALCIUM 40 MG PO TABS
40.0000 mg | ORAL_TABLET | Freq: Every day | ORAL | 1 refills | Status: AC
Start: 2023-05-19 — End: ?

## 2023-05-19 NOTE — Progress Notes (Signed)
Subjective:      Patient ID: Christine Bridges is a 72 y.o. female.    Chief Complaint:  Chief Complaint   Patient presents with    chronic care       HPI:  HPI  72 y/o female presents today for chronic care follow up.  Patient not fasting today.    Thyroid-  Takes med as prescribed.  No issues    Hypertension-  Average bp readings 120s/70s.  Gets chest pain, hedaches and light headedness .  Drinks plenty of water daily.  Limits sodium.      Hyperlipidemia-  Exercises 2x per week.  Limits carbs some.    Cad: more cp. Saw cardiology, stress test on in 1 week    Gad=10    Phq9=8    Yesterday labs done and cbc no anemia per cardiologist    Weight creeping up. Weakness for sweets    Wt Readings from Last 7 Encounters:   05/19/23 76.7 kg (169 lb)   04/11/23 76.2 kg (168 lb)   02/04/23 75.2 kg (165 lb 12.8 oz)   11/15/22 76.2 kg (168 lb)   10/29/22 72.6 kg (160 lb)   09/22/22 74 kg (163 lb 3.2 oz)   08/17/22 77 kg (169 lb 12.8 oz)        BMI Readings from Last 7 Encounters:   05/19/23 29.01 kg/m   04/11/23 28.84 kg/m   02/04/23 28.91 kg/m   11/15/22 28.84 kg/m   10/29/22 27.46 kg/m   09/22/22 28.01 kg/m   08/17/22 29.15 kg/m        Problem List:  Problem List[1]    Current Medications:  Current Medications[2]    Allergies:  Allergies[3]    Past Medical History:  Medical History[4]    Past Surgical History:  Past Surgical History[5]    Family History:  Family History[6]    Social History:  Social History[7]     The following sections were reviewed this encounter by the provider:   Tobacco  Allergies  Meds  Problems  Med Hx  Surg Hx  Fam Hx         ROS:  Review of Systems    Vitals:  BP 126/72   Pulse 70   Temp 97 F (36.1 C)   Resp 12   Wt 76.7 kg (169 lb)   BMI 29.01 kg/m      Objective:     Physical Exam:  Physical Exam  Constitutional:       General: She is not in acute distress.  HENT:      Head: Atraumatic.   Cardiovascular:      Rate and Rhythm: Regular rhythm.      Heart sounds: No murmur  heard.  Pulmonary:      Effort: Pulmonary effort is normal.      Breath sounds: Normal breath sounds. No wheezing.   Abdominal:      Tenderness: There is no abdominal tenderness.   Musculoskeletal:         General: No swelling or tenderness.      Cervical back: Neck supple.   Neurological:      General: No focal deficit present.   Psychiatric:         Behavior: Behavior normal.          Assessment:     1. Prediabetes  - POCT Hemoglobin A1C    2. Mixed hyperlipidemia  - rosuvastatin (CRESTOR) 40 MG tablet; Take 1 tablet (40 mg) by  mouth daily  Dispense: 90 tablet; Refill: 1    3. Mild episode of recurrent major depressive disorder    4. GAD (generalized anxiety disorder)    5. Coronary artery disease involving native coronary artery of native heart with angina pectoris      Plan:   Raise rosuvastatin t0 40 to aim for LDL under 55  F/u with cardiologist  Wants to hold off antianxiety med for now  Will see counsellor  Advised faith, change of habit  Continue current  other medicines  Side effect of meds discussed  Do healthy , high fiber, low cholesterol, low carb diet  Exercise most days  Plan for wellness visit every year  Follow up visit with [name] in 3 months and earlier as needed    Satira Mccallum, MD          [1]   Patient Active Problem List  Diagnosis    GAD (generalized anxiety disorder)    Vitamin D deficiency    Mixed hyperlipidemia    Hypothyroidism    GERD (gastroesophageal reflux disease)    Hiatal hernia    Prediabetes    Stage 3a chronic kidney disease    On long term drug therapy    Allergic rhinitis    History of colonic polyps    Osteopenia    Polyp of colon    Rosacea    Overweight (BMI 25.0-29.9)    Primary osteoarthritis of both knees    Coronary artery disease involving native coronary artery of native heart with angina pectoris    Hypertensive heart disease without heart failure    Other long term (current) drug therapy   [2]   Current Outpatient Medications    Medication Sig Dispense Refill    aspirin 81 MG EC tablet Take 1 tablet (81 mg) by mouth daily      CALCIUM PO Take by mouth      cetirizine (ZyrTEC) 10 MG tablet every 24 hours      clopidogrel (PLAVIX) 75 mg tablet Take 1 tablet (75 mg) by mouth      famotidine (PEPCID) 20 MG tablet Take 1 tablet (20 mg) by mouth nightly as needed      levothyroxine (SYNTHROID) 75 MCG tablet Take 1 tablet (75 mcg) by mouth daily Except none on Sunday 90 tablet 3    metoprolol tartrate (LOPRESSOR) 25 MG tablet 1 tablet (25 mg) 2 (two) times daily      Multiple Vitamins-Minerals (MULTIVITAMIN ADULTS 50+ PO) multivitamin      pantoprazole (PROTONIX) 40 MG tablet Take 1 tablet (40 mg) by mouth daily 90 tablet 3    rosuvastatin (CRESTOR) 40 MG tablet Take 1 tablet (40 mg) by mouth daily 90 tablet 1     No current facility-administered medications for this visit.   [3]   Allergies  Allergen Reactions    Albuterol Other (See Comments)     Tongue burning    Flagyl [Metronidazole]     Tape    [4]   Past Medical History:  Diagnosis Date    Abnormal vision     Has had an annual eye exam in 2016 and wears glasses which are new.     Anemia 12/16/2020    Benign essential hypertension     Chest tightness 02/21/2013    Hypokalemia 12/16/2020    Osteoporosis     Palpitations 02/21/2013    Rectal bleeding 02/21/2013   [5]   Past Surgical History:  Procedure Laterality Date    Back/Spine surgery  06/15/1983    CARDIAC CATHETERIZATION  01/09/2021    positive for restenosis    CAROTID STENT      COLONOSCOPY W/ BIOPSIES  01/21/2022    COLONOSCOPY, DIAGNOSTIC (SCREENING)  07/06/2018    Repeat in 5 years.     COLONOSCOPY, DIAGNOSTIC (SCREENING)  04/14/2013    Normal.     ESOPHAGOSCOPY, RIGID, TRANSORAL  2016    normal, done x2    FOOT SURGERY Left 05/14/2010    Left foot.     Oral/Dental surgery  12/12/2017    OTHER SURGICAL HISTORY  06/14/2016    heart work up, stress test negative.    WRIST FRACTURE SURGERY Left 04/14/2013    Left wrist fracture.     [6]   Family History  Problem Relation Name Age of Onset    Stroke Mother          Cerebrovascular accident.     Alzheimer's disease Mother      Heart disease Father      Stroke Father          Cerebrovascular accident.     Heart disease Brother      Other Brother          Heart valve replacement.     Colon cancer Paternal Grandmother          Malignant tumor of colon.    [7]   Social History  Socioeconomic History    Marital status: Divorced   Occupational History    Occupation: retired, help grandchildren, housework   Tobacco Use    Smoking status: Former     Current packs/day: 0.00     Average packs/day: 1 pack/day for 14.0 years (14.0 ttl pk-yrs)     Types: Cigarettes     Start date: 9     Quit date: 1983     Years since quitting: 41.9    Smokeless tobacco: Never    Tobacco comments:     Most recent tobacco use screening: 09/30/2017.     Vaping Use    Vaping status: Never Used   Substance and Sexual Activity    Alcohol use: Yes     Comment: rare    Drug use: Not Currently    Sexual activity: Not Currently   Social History Narrative    retired, help grandchildren, housework        Exercise: walking in the house    Lifetime fitness            Nutrition: has allowed foodlist 06/2020    Wt was 184lbs 11/2019    Check wt daily        Live with daughter and son-inlaw    3 grandchildren        Has 2 children        2 brothers and 2 sisters        Has good support system        Has medical living will 11/18/2020        Enjoys Clinical biochemist, enjoys getting  together with friends     Social Drivers of Health     Financial Resource Strain: Low Risk  (12/16/2020)    Received [facility]    Overall Financial Resource Strain (CARDIA)     Difficulty of Paying Living Expenses: Not hard at all   Stress: No Stress Concern Present (12/16/2020)    Received from [facility], [facility]  [employer] of Occupational Health - Occupational Stress Questionnaire     Feeling of Stress : Not at all     Received from [employer], [facility]    Social Network    Received from [employer], [facility]    HITS

## 2023-06-23 HISTORY — PX: CORONARY ANGIOPLASTY WITH STENT PLACEMENT: SHX49

## 2023-06-23 NOTE — Unmapped External Note (Signed)
 Pt s/p PCI to ostial PDA with same day discharge.     Pt has family at home for social support or in case of an emergency. Pt is aware that we will call him/her tomorrow morning to access possible s/s and address any questions/concerns.    Education provided to pt on risk of bleeding and infection at catheter insertion site. Verbal and written discharge instructions given to pt including physical restrictions, when to follow up with the physician and medication changes/additions. Pt given information about the possibility of Cardiac Rehab (referral provided at time of discharge) after her follow-up visit with the cardiologist. Provided time for questions and pt and her daughter verbalized understanding.    Pt has been instructed to take the following medications post PCI: Plavix and Aspirin. Pt was already prescribed a presciption of Plavix and has been taking it.  She will continue with the Plavix and Aspirin at home.  Pt instructed to take her next dose of Plavix at home tomorrow morning.    Pt ambulated to the restroom and denies CP/SOB/dizziness. VSS. EKG post-cath showed NSR. EKG reviewed and signed by cardiologist. Pressure dressing on left wrist, clean, dry and intact and no evidence of bleeding or hematoma noted. Pt discharged to home and wheeled out of unit by RN.

## 2023-06-23 NOTE — H&P (Signed)
 Cath/PCI Pre Procedure History and Physical    Date of Procedure:  06/23/2023   ------------------------------------------------------------------------------------------------------------------  Patient Name: Christine Bridges     MRN: [id number] Sex: female  Height: Height: 162.6 cm (5' 4)  DOB: [birth year] Age: 73 y.o.  Weight: Weight - Scale: 76 kg (167 lb 9.6 oz)  ------------------------------------------------------------------------------------------------------------------  Referring Provider:      Eduard Leos, MD  Primary Care Provider: Christobal Borrow  Planned Procedure:  LHC possible PCI  Pre-Procedure Diagnosis:  angina  ------------------------------------------------------------------------------------------------------------------  Chief Complaint:  Chest pain    History of Present Illness  Christine Bridges is 73 y.o. woman referred for cardiac catheterization to evaluate for chest pain and positive stress test.      No problems updated.      Past Medical History:   . Anemia   . Arthritis   . Bronchitis, chronic   . Coronary artery disease   . Dental crowns present   . GERD (gastroesophageal reflux disease)   . Heart murmur   . Hernia cerebri   . Hyperlipidemia   . Hypertension   . Hypothyroidism   . Neuropathy   . Palpitations        Social History     Tobacco Use   . Smoking status: Former     Current packs/day: 0.00     Average packs/day: 1 pack/day for 15.0 years (15.0 ttl pk-yrs)     Types: Cigarettes     Start date: 06/14/1966     Quit date: 06/14/1981     Years since quitting: 42.0   . Smokeless tobacco: Never   . Tobacco comments:     Quit smoking: Commended for not smoking   Substance Use Topics   . Alcohol use: Yes     Comment: rare       Prior to Admission medications    Medication Sig Start Date End Date Taking? Authorizing Provider   aspirin 81 MG EC tablet Take 1 tablet by mouth every morning.   Yes Historical Provider, MD   cetirizine (ZYRTEC) 10 MG tablet Take 1 tablet by  mouth daily.   Yes Historical Provider, MD   clopidogrel (PLAVIX) 75 MG tablet Take 1 tablet by mouth every morning.   Yes Historical Provider, MD   levothyroxine  (SYNTHROID ) 75 MCG tablet Take 1 tablet by mouth every morning.  Take 30 minutes to 1 hour before breakfast   Yes Historical Provider, MD   metoprolol  Tartrate (LOPRESSOR ) 25 MG tablet Take 1 tablet by mouth 2 times daily.   Yes Historical Provider, MD   Multiple Vitamin (MULTIVITAMINS PO) Take by mouth every morning.   Yes Historical Provider, MD   OYSTER SHELL CALCIUM  PO Take 500 mg by mouth daily.   Yes Historical Provider, MD   pantoprazole  (PROTONIX ) 40 MG delayed release tablet Take 1 tablet by mouth every morning.   Yes Historical Provider, MD   rosuvastatin  (CRESTOR ) 40 MG tablet Take 1 tablet by mouth every evening.   Yes Historical Provider, MD          Allergies   Allergen Reactions   . Medical Adhesive Remover [Skin Adhesives] Other (See Comments)     Skin peals off           ROS    PHYSICAL EXAM   Vital Signs:  Vitals:    06/23/23 0729 06/23/23 0730   BP:  (!) 150/66   BP Location:  Left arm   Patient Position:  Supine  Pulse:  75   Resp:  18   Temp:  36.4 C (97.5 F)   TempSrc:  Oral   SpO2:  97%   Weight: 76 kg (167 lb 9.6 oz)    Height: 1.626 m (5' 4)        Physical Exam  ------------------------------------------------------------------------------------------------------------------  Airway / ASA:  ASA 3 - Patient with severe systemic disease    Mallampati:   Class 2  ----------------------------------------------------------------------------------------------------------------  ASSESSMENT AND PLAN     Discussed the rationale, risks, benefits, and alternatives of the planned procedure(s) with the patient.  All questions were anwered.  Written informed consent was obtained.  I evaluated the patient immediately prior to the procedure.  The patient is appropriate for the planned procedure and sedation.      Sedation Plan:  Moderate  Sedation    LAB VALUES     Hematology:   Lab Results   Component Value Date    WBC 4.9 12/17/2020    HGB 9.7 (L) 12/17/2020    HCT 32.3 (L) 12/17/2020    MCV 70 (L) 12/17/2020    PLT 372 12/17/2020     No results found for: INR, PROTIME  ------------------------------------------------------------------------------------------------------------------  Labs Values / Activating Clotting times:  Lab Results   Component Value Date    NA 139 12/17/2020    K 5.3 12/17/2020    CO2 24 12/17/2020    BUN 16 12/17/2020    CREATININE 0.98 12/17/2020    GLU 105 (H) 12/17/2020    CALCIUM  9.5 12/17/2020    ALKPHOS 70 12/16/2020    AST 38 12/16/2020    ALT 24 12/16/2020    BILITOT 1.20 12/16/2020    MG 2.1 12/16/2020     No results found for: CRCLEARANCE, LABGLOM  No results found for: PHART, PCO2ART, PO2ART, HCO3, BE  No results found for: CKTOTAL, CKMB, CKMBINDEX, TROPONINI   Lab Results   Component Value Date    HDLB 70 12/17/2020    LDLB 55 12/17/2020    TRIG 68 12/17/2020    CHOLHDL 2 12/17/2020      ------------------------------------------------------------------------------------------------------------------  Patient Assessed By: Eduard Leos

## 2023-06-30 ENCOUNTER — Encounter (INDEPENDENT_AMBULATORY_CARE_PROVIDER_SITE_OTHER): Payer: Self-pay | Admitting: Family Medicine

## 2023-06-30 NOTE — Progress Notes (Signed)
 Pls advise pt and daughter, patient has slightly reduced kidney function  which needs check in next visit soon. So far no need to see kidney doctor but do lifestyle changes to protect kidneys like taking no advil/aleves, daily exercise, weight loss, controlling bp    Nurse pls give notes and labs to specialists as requested. Thank you

## 2023-07-01 ENCOUNTER — Telehealth (INDEPENDENT_AMBULATORY_CARE_PROVIDER_SITE_OTHER): Payer: Self-pay | Admitting: Family Medicine

## 2023-07-01 NOTE — Telephone Encounter (Signed)
 Received a request from [facility] to fax last office notes and labs to specialists    Faxed thru releases to   Dr Ander with [facility] to [phone]   Dr Pearla with [facility]  to [phone]    Informed the patient and her daughter through MyChart this has been done

## 2023-07-07 ENCOUNTER — Encounter (INDEPENDENT_AMBULATORY_CARE_PROVIDER_SITE_OTHER): Payer: Self-pay | Admitting: Family Medicine

## 2023-07-07 ENCOUNTER — Ambulatory Visit (INDEPENDENT_AMBULATORY_CARE_PROVIDER_SITE_OTHER): Payer: Medicare Other | Admitting: Family Medicine

## 2023-07-07 VITALS — BP 120/70 | Temp 97.3°F | Resp 70 | Wt 170.0 lb

## 2023-07-07 DIAGNOSIS — R7303 Prediabetes: Secondary | ICD-10-CM

## 2023-07-07 DIAGNOSIS — I25119 Atherosclerotic heart disease of native coronary artery with unspecified angina pectoris: Secondary | ICD-10-CM

## 2023-07-07 DIAGNOSIS — Z23 Encounter for immunization: Secondary | ICD-10-CM

## 2023-07-07 DIAGNOSIS — I350 Nonrheumatic aortic (valve) stenosis: Secondary | ICD-10-CM | POA: Insufficient documentation

## 2023-07-07 DIAGNOSIS — D500 Iron deficiency anemia secondary to blood loss (chronic): Secondary | ICD-10-CM | POA: Insufficient documentation

## 2023-07-07 DIAGNOSIS — I119 Hypertensive heart disease without heart failure: Secondary | ICD-10-CM

## 2023-07-07 DIAGNOSIS — N1831 Chronic kidney disease, stage 3a: Secondary | ICD-10-CM

## 2023-07-07 DIAGNOSIS — E782 Mixed hyperlipidemia: Secondary | ICD-10-CM

## 2023-07-07 DIAGNOSIS — F330 Major depressive disorder, recurrent, mild: Secondary | ICD-10-CM | POA: Insufficient documentation

## 2023-07-07 NOTE — Progress Notes (Signed)
 Subjective:      Patient ID: Christine Bridges is a 73 y.o. female.    Chief Complaint:  Chief Complaint   Patient presents with    Follow-up       HPI:  HPI  73 y/o female presents today for chronic care follow up.  Declined covid booster.  Patient not fasting today.    Hyperlipidemia-  No regular exercise since December.  Tries to eat well balanced meals.  Drinks plenty of water .    Depression :feels better  Phq-6  Gad-5  Started counselling    Anemia: seeing oncologist, got iron infusion, has f/u on 2/4/  More energy  Sleeping better  Problem List:  Problem List[1]    Current Medications:  Current Medications[2]    Allergies:  Allergies[3]    Past Medical History:  Medical History[4]    Past Surgical History:  Past Surgical History[5]    Family History:  Family History[6]    Social History:  Social History[7]     The following sections were reviewed this encounter by the provider:   Tobacco  Allergies  Meds  Problems  Med Hx  Surg Hx  Fam Hx         ROS:  Review of Systems    Vitals:  BP 120/70   Temp 97.3 F (36.3 C)   Resp (!) 70   Wt 77.1 kg (170 lb)   BMI 29.18 kg/m      Objective:     Physical Exam:  Physical Exam  Constitutional:       General: She is not in acute distress.  HENT:      Head: Atraumatic.   Cardiovascular:      Rate and Rhythm: Regular rhythm.      Heart sounds: Murmur heard.   Pulmonary:      Effort: Pulmonary effort is normal.      Breath sounds: Normal breath sounds. No wheezing.   Abdominal:      Tenderness: There is no abdominal tenderness.   Musculoskeletal:         General: No swelling or tenderness.      Cervical back: Neck supple.   Neurological:      General: No focal deficit present.   Psychiatric:         Behavior: Behavior normal.          Assessment:     1. Stage 3a chronic kidney disease  - Follow Up In Primary Care  - Comprehensive Metabolic Panel; Future    2. Prediabetes  - Follow Up In Primary Care    3. Immunization due  - COVID-19 mRNA seasonal vaccine 12  years and above (Moderna) 50 mcg/0.5 mL    4. Mixed hyperlipidemia  - Lipid Panel; Future    5. Mild episode of recurrent major depressive disorder    6. Coronary artery disease involving native coronary artery of native heart with angina pectoris    7. Nonrheumatic aortic (valve) stenosis    8. Iron deficiency anemia due to chronic blood loss    9. Hypertensive heart disease without heart failure      Plan:   Reviewed consult reports and labs  Continue current medicines  Side effect of meds discussed  Do healthy , high fiber, low cholesterol, low carb diet  Exercise most days when cleared by cardiologist  Plan for wellness visit every year  Follow up visit with [facility] fasting lab visit in early Feb  Continue  counselling    Talbert LAMY, MD          [1]   Patient Active Problem List  Diagnosis    GAD (generalized anxiety disorder)    Vitamin D  deficiency    Mixed hyperlipidemia    Hypothyroidism    GERD (gastroesophageal reflux disease)    Hiatal hernia    Prediabetes    Stage 3a chronic kidney disease    On long term drug therapy    Allergic rhinitis    History of colonic polyps    Osteopenia    Polyp of colon    Rosacea    Overweight (BMI 25.0-29.9)    Primary osteoarthritis of both knees    Coronary artery disease involving native coronary artery of native heart with angina pectoris    Hypertensive heart disease without heart failure    Other long term (current) drug therapy    Mild episode of recurrent major depressive disorder    Nonrheumatic aortic (valve) stenosis    Iron deficiency anemia due to chronic blood loss   [2]   Current Outpatient Medications   Medication Sig Dispense Refill    aspirin 81 MG EC tablet Take 1 tablet (81 mg) by mouth daily      CALCIUM  PO Take by mouth      cetirizine (ZyrTEC) 10 MG tablet every 24 hours      clopidogrel (PLAVIX) 75 mg tablet Take 1 tablet (75 mg) by mouth      famotidine  (PEPCID ) 20 MG tablet Take 1 tablet (20 mg) by mouth nightly as  needed      levothyroxine  (SYNTHROID ) 75 MCG tablet Take 1 tablet (75 mcg) by mouth daily Except none on Sunday 90 tablet 3    metoprolol  tartrate (LOPRESSOR ) 25 MG tablet 1 tablet (25 mg) 2 (two) times daily      Multiple Vitamins-Minerals (MULTIVITAMIN ADULTS 50+ PO) multivitamin      pantoprazole  (PROTONIX ) 40 MG tablet Take 1 tablet (40 mg) by mouth daily 90 tablet 3    rosuvastatin  (CRESTOR ) 40 MG tablet Take 1 tablet (40 mg) by mouth daily 90 tablet 1     No current facility-administered medications for this visit.   [3]   Allergies  Allergen Reactions    Albuterol  Other (See Comments)     Tongue burning    Flagyl [Metronidazole]     Tape    [4]   Past Medical History:  Diagnosis Date    Abnormal vision     Has had an annual eye exam in 2016 and wears glasses which are new.     Anemia 12/16/2020    Benign essential hypertension     Chest tightness 02/21/2013    Hypokalemia 12/16/2020    Osteoporosis     Palpitations 02/21/2013    Rectal bleeding 02/21/2013   [5]   Past Surgical History:  Procedure Laterality Date    Back/Spine surgery  06/15/1983    CARDIAC CATHETERIZATION  01/09/2021    positive for restenosis    CAROTID STENT      COLONOSCOPY W/ BIOPSIES  01/21/2022    COLONOSCOPY, DIAGNOSTIC (SCREENING)  07/06/2018    Repeat in 5 years.     COLONOSCOPY, DIAGNOSTIC (SCREENING)  04/14/2013    Normal.     CORONARY ANGIOPLASTY WITH STENT PLACEMENT  06/23/2023    ESOPHAGOSCOPY, RIGID, TRANSORAL  2016    normal, done x2    FOOT SURGERY Left 05/14/2010    Left foot.  Oral/Dental surgery  12/12/2017    OTHER SURGICAL HISTORY  06/14/2016    heart work up, stress test negative.    WRIST FRACTURE SURGERY Left 04/14/2013    Left wrist fracture.    [6]   Family History  Problem Relation Name Age of Onset    Stroke Mother          Cerebrovascular accident.     Alzheimer's disease Mother      Heart disease Father      Stroke Father          Cerebrovascular accident.     Heart disease Brother      Other Brother           Heart valve replacement.     Colon cancer Paternal Grandmother          Malignant tumor of colon.    [7]   Social History  Socioeconomic History    Marital status: Divorced   Occupational History    Occupation: retired, help grandchildren, housework   Tobacco Use    Smoking status: Former     Current packs/day: 0.00     Average packs/day: 1 pack/day for 14.0 years (14.0 ttl pk-yrs)     Types: Cigarettes     Start date: 34     Quit date: 1983     Years since quitting: 42.0    Smokeless tobacco: Never    Tobacco comments:     Most recent tobacco use screening: 09/30/2017.     Vaping Use    Vaping status: Never Used   Substance and Sexual Activity    Alcohol use: Yes     Comment: rare    Drug use: Not Currently    Sexual activity: Not Currently   Social History Narrative    retired, help grandchildren, housework        Exercise: walking in the house    Lifetime fitness            Nutrition: has allowed foodlist 06/2020    Wt was 184lbs 11/2019    Check wt daily        Live with daughter and son-inlaw    3 grandchildren        Has 2 children        2 brothers and 2 sisters        Has good support system        Has medical living will 11/18/2020        Enjoys clinical biochemist, enjoys getting  together with friends        Counselling started every 2 weeks     Social Drivers of Psychologist, [employer] Strain: Low Risk  (12/16/2020)    Received from [employer], [facility]    Overall Financial Resource Strain (CARDIA)     Difficulty of Paying Living Expenses: Not hard at all   Stress: No Stress Concern Present (12/16/2020)    Received from [employer], [facility]    [employer] of Occupational Health - Occupational Stress Questionnaire     Feeling of Stress : Not at all    Received from [employer], [facility]    Social Network    Received from [employer], [facility]    HITS

## 2023-07-08 ENCOUNTER — Ambulatory Visit (INDEPENDENT_AMBULATORY_CARE_PROVIDER_SITE_OTHER): Payer: Medicare Other

## 2023-07-08 ENCOUNTER — Encounter (INDEPENDENT_AMBULATORY_CARE_PROVIDER_SITE_OTHER): Payer: Self-pay

## 2023-07-08 NOTE — PSS Phone Screening (Signed)
 Pre-Anesthesia Evaluation    Pre-op phone visit requested by:   Reason for pre-op phone visit: Patient anticipating REPAIR, PSEUDOANEURYSM procedure.         No orders of the defined types were placed in this encounter.    Posted on 07/08/23 for DOS 07/11/23  Iron infusions: 06/17/23, 06/10/23  06/23/23 Cardiac cath with stent   Pending scan of 07/07/23 CBC, BMP in epic from [facility]  TS order in epic, patient refuses blood product    History of Present Illness/Summary:        Problem List:  Medical Problems       Hospital Problem List  Date Reviewed: 07/07/2023   None        Non-Hospital Problem List  Date Reviewed: 07/07/2023            ICD-10-CM Priority Class Noted Diagnosed    GAD (generalized anxiety disorder) F41.1   02/21/2019     Vitamin D  deficiency E55.9   02/21/2013     Mixed hyperlipidemia E78.2   11/07/2012     Hypothyroidism E03.9   04/05/2017     GERD (gastroesophageal reflux disease) K21.9   11/07/2012     Hiatal hernia K44.9   03/23/2019     Prediabetes R73.03   11/07/2012     Stage 3a chronic kidney disease N18.31   11/07/2012     Overview Addendum 05/07/2019 10:30 AM by Gaile Browning, RN     CT of abd/pelvis 04/2018 showed normal kidneys, no stones, no masses         On long term drug therapy Z79.899   03/09/2018     Allergic rhinitis J30.9   06/03/2016     History of colonic polyps Z86.0100   03/09/2018     Osteopenia M85.80   04/18/2014     Polyp of colon K63.5   05/07/2019     Rosacea L71.9   06/03/2016     Overweight (BMI 25.0-29.9) E66.3   12/10/2019     Primary osteoarthritis of both knees M17.0   06/03/2020     Coronary artery disease involving native coronary artery of native heart with angina pectoris I25.119   07/07/2020     Hypertensive heart disease without heart failure I11.9   01/16/2021     Other long term (current) drug therapy Z79.899   03/09/2018 12/04/2021    Mild episode of recurrent major depressive disorder F33.0   07/07/2023     Nonrheumatic aortic (valve) stenosis I35.0   07/07/2023     Iron  deficiency anemia due to chronic blood loss D50.0   07/07/2023         Medical History   Diagnosis Date    Abnormal vision     Has had an annual eye exam in 2016 and wears glasses which are new.     Anemia 12/16/2020    Benign essential hypertension     Chest tightness 02/21/2013    History of heart artery stent     last 06/23/23    Hypokalemia 12/16/2020    Osteoporosis     Palpitations 02/21/2013    Rectal bleeding 02/21/2013    Transfusion of blood product refused for religious reason      Past Surgical History[1]     Medication List            Accurate as of July 08, 2023  6:27 PM. Always use your most recent med list.  aspirin 81 MG EC tablet  Take 1 tablet (81 mg) by mouth daily  Medication Adjustments for Surgery: Take as prescribed     CALCIUM  PO  Take by mouth  Medication Adjustments for Surgery: Hold day of surgery     cetirizine 10 MG tablet  every 24 hours  Commonly known as: ZyrTEC  Medication Adjustments for Surgery: Take as prescribed     clopidogrel 75 mg tablet  Take 1 tablet (75 mg) by mouth  Commonly known as: PLAVIX  Medication Adjustments for Surgery: Take as prescribed     famotidine  20 MG tablet  Take 1 tablet (20 mg) by mouth nightly as needed  Commonly known as: PEPCID   Medication Adjustments for Surgery: Take as prescribed     levothyroxine  75 MCG tablet  Take 1 tablet (75 mcg) by mouth daily Except none on Sunday  Commonly known as: SYNTHROID   Medication Adjustments for Surgery: Take as prescribed     metoprolol  tartrate 25 MG tablet  1 tablet (25 mg) 2 (two) times daily  Commonly known as: LOPRESSOR   Medication Adjustments for Surgery: Take as prescribed     MULTIVITAMIN ADULTS 50+ PO  multivitamin  Medication Adjustments for Surgery: Hold day of surgery     pantoprazole  40 MG tablet  Take 1 tablet (40 mg) by mouth daily  Commonly known as: PROTONIX   Medication Adjustments for Surgery: Take as prescribed     rosuvastatin  40 MG tablet  Take 1 tablet (40 mg) by mouth  daily  Commonly known as: CRESTOR   Medication Adjustments for Surgery: Take as prescribed            Allergies[2]  Family History[3]  Social History     Occupational History    Occupation: retired, help grandchildren, housework   Tobacco Use    Smoking status: Former     Current packs/day: 0.00     Average packs/day: 1 pack/day for 14.0 years (14.0 ttl pk-yrs)     Types: Cigarettes     Start date: 36     Quit date: 1983     Years since quitting: 42.0    Smokeless tobacco: Never    Tobacco comments:     Most recent tobacco use screening: 09/30/2017.     Vaping Use    Vaping status: Never Used   Substance and Sexual Activity    Alcohol use: Yes     Comment: rare    Drug use: Not Currently    Sexual activity: Not Currently       Menstrual History:   LMP / Status  Postmenopausal     No LMP recorded. Patient is postmenopausal.    Tubal Ligation?  No valid surgical or medical questions entered.           Exam Scores:   SDB score  OSA Risk Category: No Risk        STBUR score       PONV score  Nausea Risk: VERY SEVERE RISK    MST score  MST Score: 0    PEN-FAST score       Frailty score  CFS Score: 4    CHADsVasc            Visit Vitals  Ht 1.626 m (5' 4)   Wt 77.1 kg (170 lb)   BMI 29.18 kg/m   Overweight based on BMI.                Recent Labs   Other (last  180 days) 05/19/23  1417   POCT Hgb A1C 5.5                      [1]   Past Surgical History:  Procedure Laterality Date    Back/Spine surgery  06/15/1983    CARDIAC CATHETERIZATION  01/09/2021    positive for restenosis    CAROTID STENT      COLONOSCOPY, DIAGNOSTIC (SCREENING)  07/06/2018    Repeat in 5 years.     COLONOSCOPY, DIAGNOSTIC (SCREENING)  04/14/2013    Normal.     CORONARY ANGIOPLASTY WITH STENT PLACEMENT  06/23/2023    EGD, COLONOSCOPY  01/21/2022    with biopsies    ESOPHAGOSCOPY, RIGID, TRANSORAL  2016    normal, done x2    FOOT SURGERY Left 05/14/2010    Left foot.     Oral/Dental surgery  12/12/2017    OTHER SURGICAL HISTORY  06/14/2016    heart  work up, stress test negative.    WRIST FRACTURE SURGERY Left 04/14/2013    Left wrist fracture.    [2]   Allergies  Allergen Reactions    Albuterol  Other (See Comments)     Tongue burning    Flagyl [Metronidazole]     Tape    [3]   Family History  Problem Relation Name Age of Onset    Stroke Mother          Cerebrovascular accident.     Alzheimer's disease Mother      Heart disease Father      Stroke Father          Cerebrovascular accident.     Heart disease Brother      Other Brother          Heart valve replacement.     Colon cancer Paternal Grandmother          Malignant tumor of colon.

## 2023-07-11 ENCOUNTER — Ambulatory Visit: Payer: Medicare Other

## 2023-07-11 ENCOUNTER — Encounter
Admission: RE | Disposition: A | Discharge status: Home / Self Care | Payer: Self-pay | Source: Ambulatory Visit | Attending: Specialist

## 2023-07-11 ENCOUNTER — Ambulatory Visit
Admission: RE | Admit: 2023-07-11 | Discharge: 2023-07-11 | Disposition: A | Discharge status: Home / Self Care | Payer: Medicare Other | Source: Ambulatory Visit | Attending: Specialist | Admitting: Specialist

## 2023-07-11 ENCOUNTER — Encounter: Payer: Self-pay | Admitting: Specialist

## 2023-07-11 DIAGNOSIS — Z7902 Long term (current) use of antithrombotics/antiplatelets: Secondary | ICD-10-CM | POA: Insufficient documentation

## 2023-07-11 DIAGNOSIS — I350 Nonrheumatic aortic (valve) stenosis: Secondary | ICD-10-CM | POA: Insufficient documentation

## 2023-07-11 DIAGNOSIS — Y838 Other surgical procedures as the cause of abnormal reaction of the patient, or of later complication, without mention of misadventure at the time of the procedure: Secondary | ICD-10-CM | POA: Insufficient documentation

## 2023-07-11 DIAGNOSIS — T81718A Complication of other artery following a procedure, not elsewhere classified, initial encounter: Secondary | ICD-10-CM | POA: Insufficient documentation

## 2023-07-11 DIAGNOSIS — I721 Aneurysm of artery of upper extremity: Secondary | ICD-10-CM | POA: Insufficient documentation

## 2023-07-11 DIAGNOSIS — S55192S Other specified injury of radial artery at forearm level, left arm, sequela: Secondary | ICD-10-CM

## 2023-07-11 DIAGNOSIS — Z87891 Personal history of nicotine dependence: Secondary | ICD-10-CM | POA: Insufficient documentation

## 2023-07-11 DIAGNOSIS — K449 Diaphragmatic hernia without obstruction or gangrene: Secondary | ICD-10-CM | POA: Insufficient documentation

## 2023-07-11 DIAGNOSIS — N1831 Chronic kidney disease, stage 3a: Secondary | ICD-10-CM | POA: Insufficient documentation

## 2023-07-11 DIAGNOSIS — Z955 Presence of coronary angioplasty implant and graft: Secondary | ICD-10-CM | POA: Insufficient documentation

## 2023-07-11 DIAGNOSIS — I129 Hypertensive chronic kidney disease with stage 1 through stage 4 chronic kidney disease, or unspecified chronic kidney disease: Secondary | ICD-10-CM | POA: Insufficient documentation

## 2023-07-11 DIAGNOSIS — E039 Hypothyroidism, unspecified: Secondary | ICD-10-CM | POA: Insufficient documentation

## 2023-07-11 DIAGNOSIS — K219 Gastro-esophageal reflux disease without esophagitis: Secondary | ICD-10-CM | POA: Insufficient documentation

## 2023-07-11 DIAGNOSIS — I25119 Atherosclerotic heart disease of native coronary artery with unspecified angina pectoris: Secondary | ICD-10-CM | POA: Insufficient documentation

## 2023-07-11 HISTORY — PX: REPAIR, PSEUDOANEURYSM: SHX5294

## 2023-07-11 SURGERY — REPAIR, PSEUDOANEURYSM
Anesthesia: Anesthesia General | Laterality: Left | Wound class: Clean

## 2023-07-11 MED ORDER — EPHEDRINE SULFATE 50 MG/ML IJ/IV SOLN (WRAP)
Status: DC | PRN
Start: 2023-07-11 — End: 2023-07-11
  Administered 2023-07-11 (×2): 10 mg via INTRAVENOUS

## 2023-07-11 MED ORDER — PROPOFOL 10 MG/ML IV EMUL (WRAP)
INTRAVENOUS | Status: AC
Start: 2023-07-11 — End: ?
  Filled 2023-07-11: qty 100

## 2023-07-11 MED ORDER — PROPOFOL INFUSION 10 MG/ML
INTRAVENOUS | Status: DC | PRN
Start: 2023-07-11 — End: 2023-07-11
  Administered 2023-07-11: 140 ug/kg/min via INTRAVENOUS

## 2023-07-11 MED ORDER — PAPAVERINE HCL 30 MG/ML IJ SOLN
INTRAMUSCULAR | Status: DC
Start: 2023-07-11 — End: 2023-07-11
  Filled 2023-07-11: qty 2

## 2023-07-11 MED ORDER — EPHEDRINE SULFATE 50 MG/ML IJ/IV SOLN (WRAP)
Status: AC
Start: 2023-07-11 — End: ?
  Filled 2023-07-11: qty 1

## 2023-07-11 MED ORDER — METOPROLOL TARTRATE 5 MG/5ML IV SOLN
5.0000 mg | Freq: Once | INTRAVENOUS | Status: DC | PRN
Start: 2023-07-11 — End: 2023-07-11

## 2023-07-11 MED ORDER — FENTANYL CITRATE (PF) 50 MCG/ML IJ SOLN (WRAP)
INTRAMUSCULAR | Status: AC
Start: 2023-07-11 — End: ?
  Filled 2023-07-11: qty 2

## 2023-07-11 MED ORDER — SODIUM CHLORIDE 0.9 % IR SOLN
Status: DC | PRN
Start: 2023-07-11 — End: 2023-07-11
  Administered 2023-07-11: 500 mL

## 2023-07-11 MED ORDER — ONDANSETRON HCL 4 MG/2ML IJ SOLN
INTRAMUSCULAR | Status: AC
Start: 2023-07-11 — End: ?
  Filled 2023-07-11: qty 2

## 2023-07-11 MED ORDER — LACTATED RINGERS IV SOLN
INTRAVENOUS | Status: DC | PRN
Start: 2023-07-11 — End: 2023-07-11

## 2023-07-11 MED ORDER — HEPARIN SODIUM (PORCINE) 5000 UNIT/ML IJ SOLN
INTRAMUSCULAR | Status: DC | PRN
Start: 2023-07-11 — End: 2023-07-11
  Administered 2023-07-11: 5000 [IU]

## 2023-07-11 MED ORDER — FENTANYL CITRATE (PF) 50 MCG/ML IJ SOLN (WRAP)
INTRAMUSCULAR | Status: DC | PRN
Start: 2023-07-11 — End: 2023-07-11
  Administered 2023-07-11 (×2): 25 ug via INTRAVENOUS

## 2023-07-11 MED ORDER — BUPIVACAINE-EPINEPHRINE (PF) 0.5% -1:200000 IJ SOLN
INTRAMUSCULAR | Status: DC
Start: 2023-07-11 — End: 2023-07-11
  Filled 2023-07-11: qty 20

## 2023-07-11 MED ORDER — STERILE WATER FOR INJECTION IJ/IV SOLN (WRAP)
2.0000 g | Freq: Once | INTRAMUSCULAR | Status: AC
Start: 2023-07-11 — End: 2023-07-11
  Administered 2023-07-11: 2 g via INTRAVENOUS

## 2023-07-11 MED ORDER — LIDOCAINE-EPINEPHRINE 1 %-1:100000 IJ SOLN
INTRAMUSCULAR | Status: DC
Start: 2023-07-11 — End: 2023-07-11
  Filled 2023-07-11: qty 20

## 2023-07-11 MED ORDER — GELATIN ABSORBABLE 100 EX MISC
CUTANEOUS | Status: DC | PRN
Start: 2023-07-11 — End: 2023-07-11
  Administered 2023-07-11: 1 via TOPICAL

## 2023-07-11 MED ORDER — ONDANSETRON HCL 4 MG/2ML IJ SOLN
INTRAMUSCULAR | Status: DC | PRN
Start: 2023-07-11 — End: 2023-07-11
  Administered 2023-07-11: 4 mg via INTRAVENOUS

## 2023-07-11 MED ORDER — PROPOFOL 10 MG/ML IV EMUL (WRAP)
INTRAVENOUS | Status: DC | PRN
Start: 2023-07-11 — End: 2023-07-11
  Administered 2023-07-11: 50 mg via INTRAVENOUS

## 2023-07-11 MED ORDER — LIDOCAINE-EPINEPHRINE 1 %-1:100000 IJ SOLN
INTRAMUSCULAR | Status: DC | PRN
Start: 2023-07-11 — End: 2023-07-11
  Administered 2023-07-11: 1.5 mL via INTRAMUSCULAR

## 2023-07-11 MED ORDER — MIDAZOLAM HCL 1 MG/ML IJ SOLN (WRAP)
INTRAMUSCULAR | Status: AC
Start: 2023-07-11 — End: ?
  Filled 2023-07-11: qty 2

## 2023-07-11 MED ORDER — ONDANSETRON HCL 4 MG/2ML IJ SOLN
4.0000 mg | Freq: Once | INTRAMUSCULAR | Status: DC | PRN
Start: 2023-07-11 — End: 2023-07-11

## 2023-07-11 MED ORDER — MIDAZOLAM HCL 1 MG/ML IJ SOLN (WRAP)
INTRAMUSCULAR | Status: DC | PRN
Start: 2023-07-11 — End: 2023-07-11
  Administered 2023-07-11: 1 mg via INTRAVENOUS

## 2023-07-11 MED ORDER — FENTANYL CITRATE (PF) 50 MCG/ML IJ SOLN (WRAP)
25.0000 ug | INTRAMUSCULAR | Status: DC | PRN
Start: 2023-07-11 — End: 2023-07-11

## 2023-07-11 MED ORDER — ACETAMINOPHEN 10 MG/ML IV SOLN
INTRAVENOUS | Status: DC | PRN
Start: 2023-07-11 — End: 2023-07-11
  Administered 2023-07-11: 1000 mg via INTRAVENOUS

## 2023-07-11 MED ORDER — HYDROCODONE-ACETAMINOPHEN 5-325 MG PO TABS
1.0000 | ORAL_TABLET | Freq: Four times a day (QID) | ORAL | 0 refills | Status: AC | PRN
Start: 2023-07-11 — End: 2023-07-18

## 2023-07-11 MED ORDER — OXYCODONE HCL 5 MG PO TABS
5.0000 mg | ORAL_TABLET | Freq: Once | ORAL | Status: AC | PRN
Start: 2023-07-11 — End: 2023-07-11

## 2023-07-11 MED ORDER — LACTATED RINGERS IV SOLN
INTRAVENOUS | Status: DC
Start: 2023-07-11 — End: 2023-07-11

## 2023-07-11 MED ORDER — THROMBIN 5000 UNITS EX SOLR (WRAP)
CUTANEOUS | Status: DC | PRN
Start: 2023-07-11 — End: 2023-07-11
  Administered 2023-07-11: 10000 [IU] via TOPICAL

## 2023-07-11 MED ORDER — OXYCODONE HCL 5 MG PO TABS
ORAL_TABLET | ORAL | Status: AC
Start: 2023-07-11 — End: 2023-07-11
  Administered 2023-07-11: 5 mg via ORAL
  Filled 2023-07-11: qty 1

## 2023-07-11 MED ORDER — ACETAMINOPHEN 500 MG PO TABS
1000.0000 mg | ORAL_TABLET | Freq: Once | ORAL | Status: DC | PRN
Start: 2023-07-11 — End: 2023-07-11

## 2023-07-11 MED ORDER — THROMBIN 5000 UNITS EX SOLR (WRAP)
CUTANEOUS | Status: DC
Start: 2023-07-11 — End: 2023-07-11
  Filled 2023-07-11: qty 10000

## 2023-07-11 MED ORDER — METOPROLOL TARTRATE 5 MG/5ML IV SOLN
2.5000 mg | INTRAVENOUS | Status: DC | PRN
Start: 2023-07-12 — End: 2023-07-11

## 2023-07-11 MED ORDER — HEPARIN SODIUM (PORCINE) 5000 UNIT/ML IJ SOLN
INTRAMUSCULAR | Status: DC
Start: 2023-07-11 — End: 2023-07-11
  Filled 2023-07-11: qty 1

## 2023-07-11 MED ORDER — HYDRALAZINE HCL 20 MG/ML IJ SOLN
10.0000 mg | Freq: Once | INTRAMUSCULAR | Status: DC | PRN
Start: 2023-07-11 — End: 2023-07-11

## 2023-07-11 MED ORDER — CEFAZOLIN SODIUM 2 G IJ SOLR
INTRAMUSCULAR | Status: AC
Start: 2023-07-11 — End: ?
  Filled 2023-07-11: qty 2000

## 2023-07-11 SURGICAL SUPPLY — 51 items
ADHESIVE LIQUID WATERPROOF VIAL PREP NONSTAIN MASTISOL STYRAX GUM (Skin Closure) ×1 IMPLANT
ADHESIVE LQ STYRAX GUM MASTIC ALC MTHY (Skin Closure) ×1 IMPLANT
APPLICATOR CHLORAPREP 26 ML 70% ISOPROPYL ALCOHOL 2% CHLORHEXIDINE (Applicator) ×1 IMPLANT
APPLICATOR PRP 70% ISPRP 2% CHG 26ML (Applicator) ×1 IMPLANT
BANDAGE GAUZE L3.6 YD X W3.4 IN 6 PLY ABSORBENT STRETCH TIGHT FINISH (Bandage) ×1 IMPLANT
BANDAGE MEDLINE GAUZE L3.6 YD X W3.4 IN (Bandage) ×1 IMPLANT
BLADE SURGICAL CLIPPER WIDE W37.2 MM (Blade) ×1 IMPLANT
BLADE SURGICAL CLIPPER WIDE W37.2 MM GENERAL PURPOSE EXIST HANDLE DROP (Blade) ×1 IMPLANT
CANNULA PERFUSION VENOUS BLUNT TIP (Cannula) ×1 IMPLANT
CANNULA PERFUSION VENOUS VEIN IRRIGATION BLUNT TIP IRRIGATION (Cannula) ×1 IMPLANT
DRESSING TRANSPARENT L4 3/4 IN X W4 IN (Dressing) ×2 IMPLANT
DRESSING TRANSPARENT L4 3/4 IN X W4 IN POLYURETHANE ADHESIVE (Dressing) ×2 IMPLANT
ELECTRODE ADULT PATIENT RETURN L9 FT REM POLYHESIVE ACRYLIC FOAM (Procedure Accessories) ×1 IMPLANT
ELECTRODE PATIENT RETURN L9 FT VALLEYLAB (Procedure Accessories) ×1 IMPLANT
GLOVE SURGICAL 7.5 BIOGEL PI MICRO (Glove) ×1 IMPLANT
GLOVE SURGICAL 7.5 BIOGEL PI MICRO POWDER FREE ROUGH BEAD CUFF (Glove) ×1 IMPLANT
GLOVE SURGICAL 8 BIOGEL PI INDICATOR (Glove) ×1 IMPLANT
GLOVE SURGICAL 8 BIOGEL PI INDICATOR UNDERGLOVE POWDER FREE SMOOTH (Glove) ×1 IMPLANT
NEEDLE HYPODERMIC L1.5 IN OD19 GA (Needles) ×1 IMPLANT
NEEDLE HYPODERMIC L1.5 IN OD19 GA STANDARD REGULAR BEVEL (Needles) ×1 IMPLANT
SLEEVE COMPRESSION NYLON MEDIUM KNEE (Procedure Accessories) ×1 IMPLANT
SLEEVE COMPRESSION NYLON MEDIUM KNEE LENGTH KENDALL SCD EXPRESS (Procedure Accessories) ×1 IMPLANT
SOLUTION IRRIGATION 0.9% SODIUM CHLORIDE (Irrigation Solutions) ×1 IMPLANT
SOLUTION IRRIGATION 0.9% SODIUM CHLORIDE 1000 ML PLASTIC POUR BOTTLE (Irrigation Solutions) ×1 IMPLANT
SPONGE GAUZE COTTON L4 IN X W4 IN 12 PLY WOVEN FOLD EDGE STERILE LATEX FREE (Dressing) ×1 IMPLANT
SPONGE GAUZE L4 IN X W4 IN 12 PLY WOVEN (Dressing) ×1 IMPLANT
SPONGE GAUZE L4 IN X W4 IN 12 PLY WOVEN FOLD EDGE XRAY DETECT COTTON (Dressing) ×1 IMPLANT
STOCKINETTE COTTON L60 IN X W6 IN 2 PLY (Prep) ×1 IMPLANT
STOCKINETTE COTTON L60 IN X W6 IN 2 PLY PRECUT NATURAL ALBAHEALTH (Prep) ×1 IMPLANT
STRIP SKIN CLOSURE L5 IN X W1 IN (Dressing) ×2 IMPLANT
STRIP SKIN CLOSURE L5 IN X W1 IN REINFORCE STERI-STRIP POLYESTER WHITE (Dressing) ×2 IMPLANT
SUTURE MONOCRYL 3-0 RB-1 L27 IN (Suture) ×1 IMPLANT
SUTURE MONOCRYL 3-0 RB-1 L27 IN MONOFILAMENT UNDYED ABSORBABLE (Suture) ×1 IMPLANT
SUTURE MONOCRYL 4-0 PS-2 L27 IN (Suture) ×2 IMPLANT
SUTURE MONOCRYL 4-0 PS-2 L27 IN MONOFILAMENT UNDYED ABSORBABLE (Suture) ×2 IMPLANT
SUTURE PDS II 6-0 RB-2 L30 IN 2 ARM (Suture) ×2 IMPLANT
SUTURE PDS II 6-0 RB-2 L30 IN 2 ARM MONOFILAMENT VIOLET ABSORBABLE (Suture) ×2 IMPLANT
SUTURE PDS II 7-0 BV-1 L30 IN 2 ARM (Suture) ×2 IMPLANT
SUTURE PDS II 7-0 BV-1 L30 IN 2 ARM MONOFILAMENT VIOLET ABSORBABLE (Suture) ×2 IMPLANT
SUTURE PROLENE BLUE 6-0 BV-1 L30 IN 2 (Suture) ×2 IMPLANT
SUTURE PROLENE BLUE 6-0 BV-1 L30 IN 2 ARM MONOFILAMENT NONABSORBABLE (Suture) ×2 IMPLANT
SUTURE PROLENE BLUE 7-0 BV-1 L24 IN 2 (Suture) ×2 IMPLANT
SUTURE PROLENE BLUE 7-0 BV-1 L24 IN 2 ARM MONOFILAMENT NONABSORBABLE (Suture) ×2 IMPLANT
SUTURE SILK PERMA HAND BLACK 2-0 SH L30 (Suture) ×1 IMPLANT
SUTURE SILK PERMA HAND BLACK 2-0 SH L30 IN BRAID NONABSORBABLE (Suture) ×1 IMPLANT
SYRINGE 10 ML GRADUATE NONPYROGENIC DEHP (Syringes, Needles) ×2 IMPLANT
SYRINGE 10 ML GRADUATE NONPYROGENIC DEHP FREE PVC FREE LOK MEDICAL (Syringes, Needles) ×2 IMPLANT
TAPE SURGICAL L10 YD X W1 IN STANDARD (Tape) ×1 IMPLANT
TAPE SURGICAL L10 YD X W1 IN STANDARD HYPOALLERGENIC BREATHABLE (Tape) ×1 IMPLANT
TRAY SRG AV LOOP ~~LOC~~ (Pack) ×1 IMPLANT
TRAY SURGICAL AV LOOP ~~LOC~~ (Pack) ×1 IMPLANT

## 2023-07-11 NOTE — H&P (Signed)
 HISTORY AND PHYSICAL  [facility]       Today's Date: 07/11/2023   Patient Name: Christine Bridges,Christine Bridges  Attending Physician: Babs Maser, MD  Admitting Service:  Vacular Surgery    Chief Complaint: left radial artery pseudoaneurysm     History of Present Illness:   Christine Bridges is a 73 y.o. female who  has a past medical history of Abnormal vision, Anemia (12/16/2020), Benign essential hypertension, Chest tightness (02/21/2013), History of heart artery stent, Hypokalemia (12/16/2020), Osteoporosis, Palpitations (02/21/2013), Rectal bleeding (02/21/2013), and Transfusion of blood product refused for religious reason..  This patient presents to the hospital today to undergo elective surgery due to left radial artery pseudoaneurysm after heart catheterization on 06/23/23.[name]      Past Medical History:     Past Medical History:   Diagnosis Date    Abnormal vision     Has had an annual eye exam in 2016 and wears glasses which are new.     Anemia 12/16/2020    Benign essential hypertension     Chest tightness 02/21/2013    History of heart artery stent     last 06/23/23    Hypokalemia 12/16/2020    Osteoporosis     Palpitations 02/21/2013    Rectal bleeding 02/21/2013    Transfusion of blood product refused for religious reason        Past Surgical History:   Past Surgical History[1]    Family History:   Family History[2]    Social History:     Social History     Socioeconomic History    Marital status: Divorced     Spouse name: Not on file    Number of children: Not on file    Years of education: Not on file    Highest education level: Not on file   Occupational History    Occupation: retired, help grandchildren, housework   Tobacco Use    Smoking status: Former     Current packs/day: 0.00     Average packs/day: 1 pack/day for 14.0 years (14.0 ttl pk-yrs)     Types: Cigarettes     Start date: 60     Quit date: 1983     Years since quitting: 42.1    Smokeless tobacco: Never    Tobacco comments:      Most recent tobacco use screening: 09/30/2017.     Vaping Use    Vaping status: Never Used   Substance and Sexual Activity    Alcohol use: Yes     Comment: rare    Drug use: Not Currently    Sexual activity: Not Currently   Other Topics Concern    Not on file   Social History Narrative    retired, help grandchildren, housework        Exercise: walking in the house    Lifetime fitness            Nutrition: has allowed foodlist 06/2020    Wt was 184lbs 11/2019    Check wt daily        Live with daughter and son-inlaw    3 grandchildren        Has 2 children        2 brothers and 2 sisters        Has good support system        Has medical living will 11/18/2020        Enjoys clinical biochemist, enjoys getting  together with friends  Counselling started every 2 weeks     Social Drivers of Psychologist, [employer] Strain: Low Risk  (12/16/2020)    Received from [facility], [facility]    Overall Financial Resource Strain (CARDIA)     Difficulty of Paying Living Expenses: Not hard at all   Food Insecurity: Not on file   Transportation Needs: Not on file   Physical Activity: Not on file   Stress: No Stress Concern Present (12/16/2020)    Received from [facility], [facility] - Occupational Stress Questionnaire     Feeling of Stress : Not at all   Social Connections: Unknown (10/23/2021)    Received from [facility], [facility]    Social Network     Social Network: Not on file   Intimate Partner Violence: Unknown (09/14/2021)    Received from [facility], [facility]    HITS     Physically Hurt: Not on file     Insult or Talk Down To: Not on file     Threaten Physical Harm: Not on file     Scream or Curse: Not on file   Housing Stability: Not on file       Allergies:   Allergies[3]    Medications:     Prior to Admission medications    Medication Sig Start Date End Date Taking? Authorizing Provider   aspirin 81 MG EC tablet Take 1 tablet (81 mg) by mouth daily   Yes  [provider]   CALCIUM  PO Take by mouth   Yes [provider]   cetirizine (ZyrTEC) 10 MG tablet every 24 hours   Yes [provider]   clopidogrel (PLAVIX) 75 mg tablet Take 1 tablet (75 mg) by mouth 06/23/20  Yes [provider]   levothyroxine  (SYNTHROID ) 75 MCG tablet Take 1 tablet (75 mcg) by mouth daily Except none on Sunday 12/13/22  Yes Kc, Lilly, MD   metoprolol  tartrate (LOPRESSOR ) 25 MG tablet 1 tablet (25 mg) 2 (two) times daily 10/28/20  Yes [provider]   pantoprazole  (PROTONIX ) 40 MG tablet Take 1 tablet (40 mg) by mouth daily 04/29/22  Yes Kc, Lilly, MD   rosuvastatin  (CRESTOR ) 40 MG tablet Take 1 tablet (40 mg) by mouth daily 05/19/23  Yes Kc, Lilly, MD   famotidine  (PEPCID ) 20 MG tablet Take 1 tablet (20 mg) by mouth nightly as needed    [provider]   Multiple Vitamins-Minerals (MULTIVITAMIN ADULTS 50+ PO) multivitamin    [provider]       Review of Systems:   As per the HPI.  The patient denies any additional changes to their otic, opthalmologic, dermatologic, pulmonary, cardiac, gastrointestinal, genitourinary, musculoskeletal, hematologic, constitutional, or psychiatric systems.    Physical Exam:     Vitals:    07/11/23 0956   BP: 130/61   Pulse: 63   Resp: 16   Temp: 97.4 F (36.3 C)     General appearance - alert, well appearing, and in no distress  Chest - clear to auscultation, no wheezes, rales or rhonchi, symmetric air entry  Heart - normal rate, regular rhythm, normal S1, S2, no murmurs, rubs, clicks or gallops  Abdomen - soft, nontender, nondistended, no masses or organomegaly  Extremities - peripheral pulses normal, no pedal edema, no clubbing or cyanosis, minimally pulsatile mass over left radial artery    Labs:     Results  Procedure Component Value Units Date/Time    OUTSIDE LAB SCAN [[id number]] Resulted: 07/11/23 0741     Updated: 07/11/23 0741    Narrative:      Ordered by an unspecified provider.             Rads:     Radiology Results (24 Hour)       ** No results found for the last 24 hours. **            Impression:   Problem List[4]    Plan:   Left radial artery pseudoaneurysm repair      Signed by:    Henrene Paul, MD  [facility]  Vascular and Endovascular Surgery          [1]   Past Surgical History:  Procedure Laterality Date    Back/Spine surgery  06/15/1983    CARDIAC CATHETERIZATION  01/09/2021    positive for restenosis    CAROTID STENT      COLONOSCOPY, DIAGNOSTIC (SCREENING)  07/06/2018    Repeat in 5 years.     COLONOSCOPY, DIAGNOSTIC (SCREENING)  04/14/2013    Normal.     CORONARY ANGIOPLASTY WITH STENT PLACEMENT  06/23/2023    EGD, COLONOSCOPY  01/21/2022    with biopsies    ESOPHAGOSCOPY, RIGID, TRANSORAL  2016    normal, done x2    FOOT SURGERY Left 05/14/2010    Left foot.     Oral/Dental surgery  12/12/2017    OTHER SURGICAL HISTORY  06/14/2016    heart work up, stress test negative.    WRIST FRACTURE SURGERY Left 04/14/2013    Left wrist fracture.    [2]   Family History  Problem Relation Name Age of Onset    Stroke Mother          Cerebrovascular accident.     Alzheimer's disease Mother      Heart disease Father      Stroke Father          Cerebrovascular accident.     Heart disease Brother      Other Brother          Heart valve replacement.     Colon cancer Paternal Grandmother          Malignant tumor of colon.    [3]   Allergies  Allergen Reactions    Albuterol  Other (See Comments)     Tongue burning    Flagyl [Metronidazole]     Tape    [4]   Patient Active Problem List  Diagnosis    GAD (generalized anxiety disorder)    Vitamin D  deficiency    Mixed hyperlipidemia    Hypothyroidism    GERD (gastroesophageal reflux disease)    Hiatal hernia    Prediabetes    Stage 3a chronic kidney disease    On long term drug therapy    Allergic rhinitis    History of colonic polyps    Osteopenia    Polyp of colon    Rosacea    Overweight (BMI 25.0-29.9)    Primary osteoarthritis of both knees    Coronary  artery disease involving native coronary artery of native heart with angina pectoris    Hypertensive heart disease without heart failure    Other long term (current) drug therapy    Mild episode of recurrent major depressive disorder    Nonrheumatic aortic (valve) stenosis    Iron deficiency anemia due to chronic blood loss

## 2023-07-11 NOTE — Brief Op Note (Signed)
 BRIEF OP NOTE    Date Time: 07/11/23 12:46 PM    Patient Name:   Christine Bridges, Christine Bridges (MRN: [id number])    Date of Operation:   07/11/2023     Providers Performing:   Surgeons and Role:     DEWAINE Babs Maser, MD - Primary    Surgical First Assistant(s):   First Assistant: Camet, Israel II, RN    Operative Procedure:   REPAIR, PSEUDOANEURYSM: [phone] (CPT)    Preoperative Diagnosis:   Pre-Op Diagnosis Codes:      * Other specified injury of radial artery at forearm level, left arm, sequela [S55.192S]    Postoperative Diagnosis:   Post-Op Diagnosis Codes:     * Other specified injury of radial artery at forearm level, left arm, sequela [S55.192S]    Findings:   Left radial artery pseudoaneurysm    Anesthesia:   general    Estimated Blood Loss:    * No values recorded between 07/11/2023 11:51 AM and 07/11/2023 12:46 PM *    Implants:   * No implants in log *        Drains:   Drains: no            Specimens:     ID Type Source Tests Collected by Time Destination   1 : LEFT RADIAL ARTERY PSEUDOANEURYSM THROMBUS Tissue Artery (Specify if applicable) SURGICAL PATHOLOGY Babs Maser, MD 07/11/2023 1225           Complications:   None     Signed by: Maser Babs, MD                                                                           [place] [facility]

## 2023-07-11 NOTE — Op Note (Signed)
 FULL OPERATIVE NOTE    Date Time: 07/11/23 12:48 PM  Patient Name: Christine Bridges, Christine Bridges (MRN: [id number])  Attending Physician: Babs Maser, MD      Date of Operation:   07/11/2023    Providers Performing:   Surgeons and Role:     DEWAINE Babs Maser, MD - Primary    Surgical First Assistant(s):   First Assistant: Camet, Israel II, RN    Operative Procedure:   REPAIR, PSEUDOANEURYSM: [phone] (CPT)    Preoperative Diagnosis:   Pre-Op Diagnosis Codes:      * Other specified injury of radial artery at forearm level, left arm, sequela [S55.192S]    Postoperative Diagnosis:   Post-Op Diagnosis Codes:     * Other specified injury of radial artery at forearm level, left arm, sequela [S55.192S]    Anesthesia:   general    Findings:   Left radial artery pseudoaneurysm    Indications:   Patient is a 73 year old female who underwent a recent cardiac catheterization via the left radial artery access.  Subsequently she was placed on dual antiplatelet therapy and has developed a left radial artery pseudoaneurysm which is a source of discomfort for her.    Operative Notes:   Patient was taken operating room placed in supine position.  After induction of IV sedation left arm was prepped and draped in sterile fashion.  2 cc of local anesthetic was infiltrated along the course of the radial aspect of the left wrist.  A 2 cm incision was created and carried down subcutaneous tissue.  Pseudoaneurysm capsule was identified and dissected circumferentially and transected off of the artery and passed off the field as specimen.  There was a thrombus plug in the origin of the pseudoaneurysm which was removed.  The arteriotomy hole was closed with interrupted 7-0 Prolene.  Hemostasis was achieved and wound was closed in layered manner and skin was closed in subcuticular fashion.  Steri-Strips and sterile dressings were applied and patient was transported recovery room in stable condition.              Estimated Blood Loss:   * No values  recorded between 07/11/2023 11:51 AM and 07/11/2023 12:48 PM *    Implants:   * No implants in log *       Drains:   Drains: no      Specimens:     ID Type Source Tests Collected by Time Destination   1 : LEFT RADIAL ARTERY PSEUDOANEURYSM THROMBUS Tissue Artery (Specify if applicable) SURGICAL PATHOLOGY Babs Maser, MD 07/11/2023 1225          Complications:   None    Signed by: Maser Babs, MD  [place] [facility]

## 2023-07-11 NOTE — Discharge Instr - AVS First Page (Addendum)
 Resume regular diet  May shower in 48 hours  Remove dressing in 48 hours  Pain medication as prescribed  Call office [phone] and make an appointment for left arm duplex study on your post operative follow up visit in 2 weeks    Post Anesthesia Discharge Instructions    Although you may be awake and alert in the recovery room, small amounts of anesthetic remain in your system for about 24 hours. You may feel tired and sleepy during this time.    You are advised to go directly home from the hospital.    Plan to stay at home and rest for the remainder of the day.    It is advisable to have someone with you at home for 24 hours after surgery.    Do not operate a motor vehicle, or any mechanical or electrical equipment for the next 24 hours.    Be careful when you are walking around, you may become dizzy. The effects of anesthesia and/or medications are still present and drowsiness may occur.    Do not consume alcohol, tranquilizers, sleeping medications, or any other non prescribed medications for the remainder of the day.    Diet: Begin with liquids, progress your diet as tolerated or as directed by your surgeon. Nausea and vomiting may occur in the next 24 hours.

## 2023-07-11 NOTE — Anesthesia Preprocedure Evaluation (Signed)
 Anesthesia Evaluation    AIRWAY    Mallampati: III    TM distance: <3 FB  Neck ROM: limited  Mouth Opening:full   CARDIOVASCULAR    cardiovascular exam normal       DENTAL    no notable dental hx               PULMONARY    pulmonary exam normal     OTHER FINDINGS                                        Relevant Problems   CARDIO   (+) Coronary artery disease involving native coronary artery of native heart with angina pectoris   (+) Nonrheumatic aortic (valve) stenosis      GI   (+) GERD (gastroesophageal reflux disease)   (+) Hiatal hernia      GU/RENAL   (+) Stage 3a chronic kidney disease      ENDO   (+) Hypothyroidism               Anesthesia Plan    ASA 4     general                                                      Signed by: Alm JAYSON Elms, MD 07/11/23 10:22 AM

## 2023-07-11 NOTE — Nursing Progress Note (Signed)
PEC Nav review:  07/09/23 CBC, BMP WDL/Media.  05/18/24 HgA1c WDL.

## 2023-07-11 NOTE — Anesthesia Postprocedure Evaluation (Signed)
 Anesthesia Post Evaluation    Patient: Christine Bridges    Procedure(s):  REPAIR, PSEUDOANEURYSM    Anesthesia type: general    Last Vitals:   Vitals Value Taken Time   BP 130/60 07/11/23 1320   Temp 36.3 C (97.4 F) 07/11/23 1320   Pulse 78 07/11/23 1310   Resp 17 07/11/23 1320   SpO2 95 % 07/11/23 1310                 Anesthesia Post Evaluation:     Patient Evaluated: PACU    Level of Consciousness: awake and alert    Pain Management: adequate  Multimodal analgesia pain management approach  Strategies: acetaminophen , steroids and local anesthesia  Airway Patency: patent  Two or more mitigation strategies used for obstructive sleep apnea.  Strategies: multimodal analgesia, awake extubation and extubation/recovery carried out in nonsupine position    Anesthetic complications: No      PONV Status: none    Cardiovascular status: acceptable  Respiratory status: acceptable  Hydration status: acceptable          Signed by: Alm JAYSON Elms, MD, 07/11/2023 2:09 PM

## 2023-07-12 ENCOUNTER — Encounter: Payer: Self-pay | Admitting: Specialist

## 2023-07-15 LAB — SURGICAL PATHOLOGY

## 2023-07-29 ENCOUNTER — Other Ambulatory Visit (FREE_STANDING_LABORATORY_FACILITY): Payer: Medicare Other

## 2023-07-29 DIAGNOSIS — N1831 Chronic kidney disease, stage 3a: Secondary | ICD-10-CM

## 2023-07-29 DIAGNOSIS — E782 Mixed hyperlipidemia: Secondary | ICD-10-CM

## 2023-07-29 DIAGNOSIS — E039 Hypothyroidism, unspecified: Secondary | ICD-10-CM

## 2023-07-29 LAB — COMPREHENSIVE METABOLIC PANEL
ALT: 42 U/L (ref <=55)
AST (SGOT): 42 U/L — ABNORMAL HIGH (ref <=41)
Albumin/Globulin Ratio: 1.6 (ref 0.9–2.2)
Albumin: 4.4 g/dL (ref 3.5–5.0)
Alkaline Phosphatase: 75 U/L (ref 37–117)
Anion Gap: 9 (ref 5.0–15.0)
BUN: 16 mg/dL (ref 7–21)
Bilirubin, Total: 1.3 mg/dL — ABNORMAL HIGH (ref 0.2–1.2)
CO2: 25 meq/L (ref 17–29)
Calcium: 9.8 mg/dL (ref 7.9–10.2)
Chloride: 107 meq/L (ref 99–111)
Creatinine: 1.3 mg/dL — ABNORMAL HIGH (ref 0.4–1.0)
GFR: 42.9 mL/min/{1.73_m2} — ABNORMAL LOW (ref >=60.0)
Globulin: 2.8 g/dL (ref 2.0–3.6)
Glucose: 107 mg/dL — ABNORMAL HIGH (ref 70–100)
Hemolysis Index: 8 {index}
Potassium: 5.3 meq/L (ref 3.5–5.3)
Protein, Total: 7.2 g/dL (ref 6.0–8.3)
Sodium: 141 meq/L (ref 135–145)

## 2023-07-29 LAB — LIPID PANEL
Cholesterol / HDL Ratio: 2.4 {index}
Cholesterol: 142 mg/dL (ref <=199)
HDL: 60 mg/dL (ref >=40)
LDL Calculated: 64 mg/dL (ref 0–99)
Triglycerides: 92 mg/dL (ref 34–149)
VLDL Calculated: 18 mg/dL (ref 10–40)

## 2023-07-29 LAB — TSH: TSH: 0.73 u[IU]/mL (ref 0.35–4.94)

## 2023-07-31 ENCOUNTER — Other Ambulatory Visit (INDEPENDENT_AMBULATORY_CARE_PROVIDER_SITE_OTHER): Payer: Self-pay | Admitting: Family Medicine

## 2023-07-31 DIAGNOSIS — E782 Mixed hyperlipidemia: Secondary | ICD-10-CM

## 2023-07-31 MED ORDER — EZETIMIBE 10 MG PO TABS
10.0000 mg | ORAL_TABLET | Freq: Every day | ORAL | 5 refills | Status: AC
Start: 2023-07-31 — End: ?

## 2023-08-19 ENCOUNTER — Telehealth (INDEPENDENT_AMBULATORY_CARE_PROVIDER_SITE_OTHER): Payer: Self-pay

## 2023-08-19 NOTE — Telephone Encounter (Signed)
 Spoke with pt by phone. She was requesting recent cholesterol results. Results reviewed with patient, she declined to have those faxed at this time.   Understanding stated.

## 2023-08-25 ENCOUNTER — Encounter (INDEPENDENT_AMBULATORY_CARE_PROVIDER_SITE_OTHER): Payer: Self-pay | Admitting: Adult Health

## 2023-08-25 ENCOUNTER — Ambulatory Visit (INDEPENDENT_AMBULATORY_CARE_PROVIDER_SITE_OTHER)

## 2023-08-25 ENCOUNTER — Telehealth (INDEPENDENT_AMBULATORY_CARE_PROVIDER_SITE_OTHER): Admitting: Adult Health

## 2023-08-25 ENCOUNTER — Encounter (INDEPENDENT_AMBULATORY_CARE_PROVIDER_SITE_OTHER): Payer: Self-pay | Admitting: Family Medicine

## 2023-08-25 VITALS — Temp 98.0°F | Ht 64.0 in | Wt 169.0 lb

## 2023-08-25 DIAGNOSIS — E079 Disorder of thyroid, unspecified: Secondary | ICD-10-CM | POA: Insufficient documentation

## 2023-08-25 DIAGNOSIS — R0609 Other forms of dyspnea: Secondary | ICD-10-CM | POA: Insufficient documentation

## 2023-08-25 DIAGNOSIS — J069 Acute upper respiratory infection, unspecified: Secondary | ICD-10-CM

## 2023-08-25 DIAGNOSIS — N289 Disorder of kidney and ureter, unspecified: Secondary | ICD-10-CM | POA: Insufficient documentation

## 2023-08-25 DIAGNOSIS — Z860100 Personal history of colon polyps, unspecified: Secondary | ICD-10-CM | POA: Insufficient documentation

## 2023-08-25 DIAGNOSIS — R945 Abnormal results of liver function studies: Secondary | ICD-10-CM | POA: Insufficient documentation

## 2023-08-25 LAB — IHS AMB POCT SOFIA (TM) COVID-19 & FLU A/B
Sofia Influenza A Ag POCT: NEGATIVE
Sofia Influenza B Ag POCT: NEGATIVE
Sofia SARS COV2 Antigen POCT: NEGATIVE

## 2023-08-25 MED ORDER — PANTOPRAZOLE SODIUM 40 MG PO TBEC
40.0000 mg | DELAYED_RELEASE_TABLET | Freq: Every day | ORAL | 3 refills | Status: AC
Start: 2023-08-25 — End: ?

## 2023-08-25 NOTE — Progress Notes (Signed)
 [facility]                       Date of Virtual Visit: 08/25/2023 1:49 PM        Patient ID: Christine Bridges is a 73 y.o. female.  Attending Physician: Paislei Dorval K Kaeleen Odom, NP       Telemedicine Eligibility:    State Location:  [x]  [place]   []  [place]   []  [place] []  [place]   []  Other:    Physical Location:  [x]  Home  []  Office  []  Other:    Patient Identity Verification:  [x]  State Issued ID  []  Insurance Eligibility Check  []  Other:    Physical Address and Phone Verification (for 911):  [x]  Yes  []  No    Personal identity shared with patient:  [x]  Yes  []  No    Education on nature of video visit shared with patient:  [x]  Yes  []  No    Emergency plan agreed upon with patient:  [x]  Yes  []  No    Visit terminated since not appropriate for virtual care:  [x]  N/A  []  Reason:    This visit is being conducted via video and telephone.  Verbal consent has been obtained from the patient to conduct a video and telephone visit to minimize exposure to COVID-19: yes.  Patient consents to bill insurance for this visit.       Chief Complaint:    Chief Complaint   Patient presents with    URI     Consents to VV and billing     C/O ST and glands are swollen since 08/20/2023. ST doing better  +HA, runny nose  Loose stool today  States daughter recently diagnosed with flu                 HPI:    73 y.o female Pt presents today via virtual visit for URI sxs  Pt reports 5 days of cold sxs              Problem List:    Problem List[1]          Current Meds:    Medications Taking[2]       Allergies:    Allergies[3]          Past Surgical History:    Past Surgical History[4]        Family History:    Family History[5]        Social History:    Social History[6]        The following sections were reviewed this encounter by the provider:            Vital Signs:    Temp 98 F (36.7 C)   Ht 1.626 m (5' 4)   Wt 76.7 kg (169 lb)   BMI 29.01 kg/m          ROS:    Review of Systems    Constitutional:  Positive for activity change, chills and fatigue. Negative for fever.   HENT:  Positive for congestion, postnasal drip and rhinorrhea.    Eyes: Negative.    Respiratory: Negative.     Cardiovascular: Negative.    Gastrointestinal: Negative.    Genitourinary: Negative.    Musculoskeletal:  Negative for gait problem, joint swelling and myalgias.   Skin: Negative.    Neurological:  Positive for weakness and headaches.   Psychiatric/Behavioral: Negative.     All other systems reviewed  and are negative.             Physical Exam:    Physical Exam  Constitutional:       Appearance: She is well-developed. She is ill-appearing.   HENT:      Head: Normocephalic.      Nose: Congestion and rhinorrhea present.   Eyes:      Conjunctiva/sclera: Conjunctivae normal.      Pupils: Pupils are equal, round, and reactive to light.   Cardiovascular:      Rate and Rhythm: Normal rate and regular rhythm.   Musculoskeletal:         General: Normal range of motion.      Cervical back: Normal range of motion and neck supple.   Skin:     Coloration: Skin is pale.   Neurological:      General: No focal deficit present.      Mental Status: She is alert.   Psychiatric:         Mood and Affect: Mood normal.         Behavior: Behavior normal.         Thought Content: Thought content normal.         Judgment: Judgment normal.                Assessment:    1. Acute URI  Sofia(TM) SARS COVID19 & Flu A/B POCT                  Plan:    [name]            Follow-up:            Latoria Dry K Carlyle Achenbach, NP                     [1]   Patient Active Problem List  Diagnosis    GAD (generalized anxiety disorder)    Vitamin D  deficiency    Hyperlipidemia LDL goal <160    Disorder of thyroid  gland    Hypertension    GERD (gastroesophageal reflux disease)    Hiatal hernia    Prediabetes    Stage 3a chronic kidney disease (CMS/HCC)    On long term drug therapy    Allergic rhinitis    Chest tightness    History of colonic polyps    Osteoporosis     Palpitations    Polyp of colon    Rectal bleeding    Rosacea    Overweight (BMI 25.0-29.9)    Primary osteoarthritis of both knees    Atherosclerosis of native coronary artery    Anemia, unspecified    NSTEMI (non-ST elevated myocardial infarction) (CMS/HCC)    Hypertensive heart disease without heart failure    Other long term (current) drug therapy    Mild episode of recurrent major depressive disorder    Aortic valve stenosis    Iron deficiency anemia    Arthritis    Hx of colonic polyps    Hypercholesterolemia    Kidney disease    Thyroid  disease    Abnormal results of liver function studies    Dyspnea on exertion    Presence of coronary angioplasty implant and graft    Nutritional anemia, unspecified    Other chest pain   [2]   Outpatient Medications Marked as Taking for the 08/25/23 encounter (Telemedicine Visit) with Augie Vane K, NP   Medication Sig Dispense Refill    aspirin 81 MG EC tablet Take  1 tablet (81 mg) by mouth daily      CALCIUM  PO Take by mouth      cetirizine (ZyrTEC) 10 MG tablet every 24 hours      clopidogrel (PLAVIX) 75 mg tablet Take 1 tablet (75 mg) by mouth      ezetimibe  (ZETIA ) 10 MG tablet Take 1 tablet (10 mg) by mouth daily 30 tablet 5    levothyroxine  (SYNTHROID ) 75 MCG tablet Take 1 tablet (75 mcg) by mouth daily Except none on Sunday 90 tablet 3    metoprolol  tartrate (LOPRESSOR ) 25 MG tablet 1 tablet (25 mg) 2 (two) times daily      Multiple Vitamins-Minerals (MULTIVITAMIN ADULTS 50+ PO) multivitamin      rosuvastatin  (CRESTOR ) 40 MG tablet Take 1 tablet (40 mg) by mouth daily 90 tablet 1   [3]   Allergies  Allergen Reactions    Albuterol  Other (See Comments)     Tongue burning    Metronidazole Other (See Comments)    Tape     Wound Dressing Adhesive    [4]   Past Surgical History:  Procedure Laterality Date    Back/Spine surgery  06/15/1983    CARDIAC CATHETERIZATION  01/09/2021    positive for restenosis    CAROTID STENT      COLONOSCOPY, DIAGNOSTIC (SCREENING)  07/06/2018     Repeat in 5 years.     COLONOSCOPY, DIAGNOSTIC (SCREENING)  04/14/2013    Normal.     CORONARY ANGIOPLASTY WITH STENT PLACEMENT  06/23/2023    EGD, COLONOSCOPY  01/21/2022    with biopsies    ESOPHAGOSCOPY, RIGID, TRANSORAL  2016    normal, done x2    FOOT SURGERY Left 05/14/2010    Left foot.     Oral/Dental surgery  12/12/2017    OTHER SURGICAL HISTORY  06/14/2016    heart work up, stress test negative.    REPAIR, PSEUDOANEURYSM Left 07/11/2023    Procedure: REPAIR, PSEUDOANEURYSM;  Surgeon: Babs Maser, MD;  Location: [place] [facility];  Service: Vascular;  Laterality: Left;    WRIST FRACTURE SURGERY Left 04/14/2013    Left wrist fracture.    [5]   Family History  Problem Relation Name Age of Onset    Stroke Mother          Cerebrovascular accident.     Alzheimer's disease Mother      Heart disease Father      Stroke Father          Cerebrovascular accident.     Heart disease Brother      Other Brother          Heart valve replacement.     Colon cancer Paternal Grandmother          Malignant tumor of colon.    [6]   Social History  Tobacco Use    Smoking status: Former     Current packs/day: 0.00     Average packs/day: 1 pack/day for 14.0 years (14.0 ttl pk-yrs)     Types: Cigarettes     Start date: 56     Quit date: 67     Years since quitting: 42.2    Smokeless tobacco: Never    Tobacco comments:     Most recent tobacco use screening: 09/30/2017.     Vaping Use    Vaping status: Never Used   Substance Use Topics    Alcohol use: Yes     Comment: rare    Drug use:  Not Currently

## 2023-08-25 NOTE — Addendum Note (Signed)
 Addended by: Despina Pole on: 08/25/2023 03:15 PM     Modules accepted: Orders

## 2023-08-26 LAB — SARS-COV-2 (COVID-19) RNA, PCR: SARS-CoV-2 (COVID-19) RNA: NEGATIVE

## 2023-09-06 ENCOUNTER — Telehealth (INDEPENDENT_AMBULATORY_CARE_PROVIDER_SITE_OTHER): Payer: Self-pay | Admitting: Family Medicine

## 2023-09-06 NOTE — Telephone Encounter (Signed)
 Name of Caller: Patient   Clinical Question: Per patient she would like a call back to discuss if she needs to have the measles vaccine   Last DOS: 08/25/2023 VV  Caller's preferred ph: [phone]

## 2023-09-07 NOTE — Telephone Encounter (Signed)
 LDVM advised that blood titer would need to be done prior to receiving the measles booster and for the patient to call the office to schedule lab work if she would like to proceed.

## 2023-09-16 NOTE — Progress Notes (Signed)
 I have reviewed this encounter and agree with the assessment and plan outlined for cardiac rehab for this patient.        Charletta Schneiders, MD, [facility]  [facility]    Electronically signed: 09/16/23,  14:53

## 2023-10-04 ENCOUNTER — Telehealth (INDEPENDENT_AMBULATORY_CARE_PROVIDER_SITE_OTHER): Payer: Self-pay | Admitting: Family Medicine

## 2023-10-04 DIAGNOSIS — Z1231 Encounter for screening mammogram for malignant neoplasm of breast: Secondary | ICD-10-CM

## 2023-10-04 NOTE — Telephone Encounter (Signed)
 Sent mychart message

## 2023-10-04 NOTE — Telephone Encounter (Signed)
 Pls advise ready referral thank you

## 2023-10-04 NOTE — Telephone Encounter (Signed)
 Pt is scheduled for mammogram and needs order.

## 2023-10-25 ENCOUNTER — Telehealth (INDEPENDENT_AMBULATORY_CARE_PROVIDER_SITE_OTHER)

## 2023-10-25 ENCOUNTER — Ambulatory Visit (INDEPENDENT_AMBULATORY_CARE_PROVIDER_SITE_OTHER): Admitting: Family Nurse Practitioner

## 2023-10-25 ENCOUNTER — Encounter (INDEPENDENT_AMBULATORY_CARE_PROVIDER_SITE_OTHER): Payer: Self-pay

## 2023-10-25 VITALS — BP 116/73 | HR 87 | Temp 98.4°F | Resp 22 | Ht 64.0 in | Wt 163.0 lb

## 2023-10-25 DIAGNOSIS — R062 Wheezing: Secondary | ICD-10-CM

## 2023-10-25 DIAGNOSIS — B9689 Other specified bacterial agents as the cause of diseases classified elsewhere: Secondary | ICD-10-CM

## 2023-10-25 DIAGNOSIS — J208 Acute bronchitis due to other specified organisms: Secondary | ICD-10-CM

## 2023-10-25 MED ORDER — AZITHROMYCIN 250 MG PO TABS
ORAL_TABLET | ORAL | 0 refills | Status: AC
Start: 2023-10-25 — End: 2023-10-30

## 2023-10-25 MED ORDER — GUAIFENESIN ER 600 MG PO TB12
600.0000 mg | ORAL_TABLET | Freq: Two times a day (BID) | ORAL | 0 refills | Status: AC
Start: 2023-10-25 — End: 2023-10-29

## 2023-10-25 MED ORDER — ALBUTEROL SULFATE (2.5 MG/3ML) 0.083% IN NEBU
2.5000 mg | INHALATION_SOLUTION | Freq: Once | RESPIRATORY_TRACT | Status: AC
Start: 2023-10-25 — End: 2023-10-25
  Administered 2023-10-25: 2.5 mg via RESPIRATORY_TRACT

## 2023-10-25 MED ORDER — ALBUTEROL SULFATE HFA 108 (90 BASE) MCG/ACT IN AERS
2.0000 | INHALATION_SPRAY | RESPIRATORY_TRACT | 0 refills | Status: AC | PRN
Start: 2023-10-25 — End: 2024-03-23

## 2023-10-25 NOTE — Progress Notes (Signed)
 Patient: Christine Bridges    Date: 10/25/2023   MRN: [id number]          Subjective        Chief Complaint   Patient presents with    Cough     Pt was seen on Monday. Still feeling ache. Test for flu,covid and strep. negative    Sore throat.          HPI   Christine Bridges is a 73 y.o. female presenting to Urgent Care with complaint of achiness, congestion, low grade fever, productive cough, and sore throat that began 4 days ago.   Patient denies vomiting, diarrhea, SOB, and chest pain.  Has not attempted any alleviating measures prior to visit.Christine Bridges  denies recent travel.       History:  Pertinent Past Medical, Surgical, Family and Social History were reviewed.   Current Medications[1]  Allergies[2]  Medications and Allergies reviewed.         Objective   Vitals:    10/25/23 1940   BP: 116/73   Pulse: 87   Resp: 22   Temp: 98.4 F (36.9 C)   TempSrc: Tympanic   SpO2: 97%   Weight: 73.9 kg (163 lb)   Height: 1.626 m (5' 4)     Body mass index is 27.98 kg/m.    Physical Exam  Vitals reviewed.   Constitutional:       Appearance: Normal appearance.   HENT:      Right Ear: Tympanic membrane normal.      Left Ear: Tympanic membrane normal.      Nose: Congestion and rhinorrhea present.      Mouth/Throat:      Pharynx: Oropharynx is clear.   Eyes:      Conjunctiva/sclera: Conjunctivae normal.   Cardiovascular:      Rate and Rhythm: Normal rate and regular rhythm.      Pulses: Normal pulses.      Heart sounds: Normal heart sounds.   Pulmonary:      Breath sounds: Wheezing and rhonchi present.      Comments: Post neb- CTA - responsive to albuterol .   Abdominal:      General: Abdomen is flat.      Palpations: Abdomen is soft.   Neurological:      Mental Status: She is alert.              UCC Course   There were no labs reviewed with this patient during the visit.  There were no x-rays reviewed with this patient during the visit.  Current Inpatient Medications with Last Dose Taken[3]       Procedures    Procedures      MDM/Assessment    Pt presenting with worsening cough. Wheezing and rhonchi on arrival. Very responsive to neb in office. Given age and co morbidities, will treat with abx out of caution along with recommending albuterol  as needed given response in clinic. Recommend f/u in 48 hrs with PCP for lunch recheck.     Differential Diagnosis: Sinusitis, Bronchitis, Pharyngitis, Pneumonia, Influenza, COVID-19 and Allergic Rhinitis  Encounter Diagnoses   Name Primary?    Wheezing     Acute bacterial bronchitis Yes            Plan    OTC Mucinex twice a day as needed  Tylenol /ibuprofen as needed  Daily probiotic therapy while on antibiotics   Complete entire course of antibiotic therapy as directed.  Recommend further evaluation if the following symptoms develop:  worsening of symptoms, fever, shortness of breath, persistent cough, wheezing or chest pain.    No orders of the defined types were placed in this encounter.    Requested Prescriptions     Signed Prescriptions Disp Refills    guaiFENesin (MUCINEX) 600 MG 12 hr tablet 8 tablet 0     Sig: Take 1 tablet (600 mg) by mouth 2 (two) times daily for 4 days    azithromycin (ZITHROMAX) 250 MG tablet 6 tablet 0     Sig: Take 2 tabs daily on day 1, then 1 tablet daily for 4 days.    albuterol  sulfate HFA (Proventil  HFA) 108 (90 Base) MCG/ACT inhaler 1 each 0     Sig: Inhale 2 puffs into the lungs every 4 (four) hours as needed for Wheezing or Shortness of Breath       Discussed results and diagnosis with patient/family.  Reviewed warning signs for worsening condition, as well as, indications for follow-up with primary care physician and return to urgent care clinic.   Patient/family expressed understanding of instructions.     An After Visit Summary with pertinent information was made available to patient/family via MyChart or in-print.           [1]   Current Outpatient Medications:     aspirin 81 MG EC tablet, Take 1 tablet (81 mg) by mouth daily, Disp: , Rfl:      benzonatate (TESSALON) 100 MG capsule, Take 1 capsule (100 mg) by mouth 3 (three) times daily as needed for Cough, Disp: , Rfl:     CALCIUM  PO, Take by mouth, Disp: , Rfl:     cetirizine (ZyrTEC) 10 MG tablet, every 24 hours, Disp: , Rfl:     clopidogrel (PLAVIX) 75 mg tablet, Take 1 tablet (75 mg) by mouth, Disp: , Rfl:     ezetimibe  (ZETIA ) 10 MG tablet, Take 1 tablet (10 mg) by mouth daily, Disp: 30 tablet, Rfl: 5    albuterol  sulfate HFA (Proventil  HFA) 108 (90 Base) MCG/ACT inhaler, Inhale 2 puffs into the lungs every 4 (four) hours as needed for Wheezing or Shortness of Breath, Disp: 1 each, Rfl: 0    azithromycin (ZITHROMAX) 250 MG tablet, Take 2 tabs daily on day 1, then 1 tablet daily for 4 days., Disp: 6 tablet, Rfl: 0    famotidine  (PEPCID ) 20 MG tablet, Take 1 tablet (20 mg) by mouth nightly as needed (Patient not taking: Reported on 08/25/2023), Disp: , Rfl:     guaiFENesin (MUCINEX) 600 MG 12 hr tablet, Take 1 tablet (600 mg) by mouth 2 (two) times daily for 4 days, Disp: 8 tablet, Rfl: 0    levothyroxine  (SYNTHROID ) 75 MCG tablet, Take 1 tablet (75 mcg) by mouth daily Except none on Sunday, Disp: 90 tablet, Rfl: 3    metoprolol  tartrate (LOPRESSOR ) 25 MG tablet, 1 tablet (25 mg) 2 (two) times daily, Disp: , Rfl:     Multiple Vitamins-Minerals (MULTIVITAMIN ADULTS 50+ PO), multivitamin, Disp: , Rfl:     pantoprazole  (PROTONIX ) 40 MG tablet, Take 1 tablet (40 mg) by mouth daily, Disp: 90 tablet, Rfl: 3    rosuvastatin  (CRESTOR ) 40 MG tablet, Take 1 tablet (40 mg) by mouth daily, Disp: 90 tablet, Rfl: 1  No current facility-administered medications for this visit.  [2]   Allergies  Allergen Reactions    Metronidazole Other (See Comments)    Tape     Wound Dressing Adhesive    [3]   No current facility-administered  medications for this visit.

## 2023-10-25 NOTE — Progress Notes (Signed)
  URGENT  CARE  PROGRESS NOTE     Patient: Christine Bridges   Date: 10/25/2023   MRN: [id number]     This visit is being conducted via video and telephone. Yes  Verbal consent has been obtained from the patient to conduct a video visit. Yes  Patient confirms physically located in [place]  during visit. Yes      Christine Bridges is a 73 y.o. female       HISTORY     No chief complaint on file.       73 year old female patient who presents with complaints of cough x 3 days.  Reports productive cough with yellowish sputum with no aggravating symptoms.  Her symptoms are accompanied by myalgia and sore throat.  Denies any fever, shortness of breath, chest pain, posttussive emesis, nausea, vomiting.  She was evaluated 2 days ago at an outside urgent care and POCT testing was negative. She was prescribed benzonatate for symptoms.  Reports worsening of symptoms since she was last seen.          Review of Systems    History:  Medical History[1]    Past Surgical History[2]    Family History[3]    Social History[4]    History reviewed.      Current Medications[5]    Allergies[6]    Kc, Lilly, MD      PHYSICAL EXAM     There were no vitals filed for this visit.    Physical Exam  Constitutional:       Appearance: Normal appearance.   Pulmonary:      Effort: Pulmonary effort is normal.   Neurological:      Mental Status: She is alert and oriented to person, place, and time.           MEDICAL DECISION MAKING     DDX: PNA, Viral URI, Whooping cough, bronchitis, pulmonary edema, Asthma,       LABS     There were no labs reviewed with this patient during the visit.    There were no x-rays reviewed with this patient during the visit.    ASSESSMENT     No diagnosis found.       ASSESSMENT    PLAN     Given patient's age and multiple comorbidities, recommend full in person evaluation to evaluate pneumonia versus pulmonary edema.  Patient acknowledges understanding.  Will go for full in-person evaluation.    Discussed  diagnosis and treatment with patient.  Reviewed warning signs for worsening condition as well as indications for follow-up with pmd, return to the urgent care center or emergency department.  The patient expressed understanding of instructions.    Patient aware of the alternative for an in person visits.  Yes    No orders of the defined types were placed in this encounter.        My chart access to the After Visit Summary was reviewed..  Yes      Signed,  Romeo DELENA Bradley, FNP         [1]   Past Medical History:  Diagnosis Date    Abnormal vision     Has had an annual eye exam in 2016 and wears glasses which are new.     Anemia 12/16/2020    Benign essential hypertension     Chest tightness 02/21/2013    History of heart artery stent     last 06/23/23    Hypokalemia 12/16/2020    Osteoporosis  Palpitations 02/21/2013    Rectal bleeding 02/21/2013    Transfusion of blood product refused for religious reason    [2]   Past Surgical History:  Procedure Laterality Date    Back/Spine surgery  06/15/1983    CARDIAC CATHETERIZATION  01/09/2021    positive for restenosis    CAROTID STENT      COLONOSCOPY, DIAGNOSTIC (SCREENING)  07/06/2018    Repeat in 5 years.     COLONOSCOPY, DIAGNOSTIC (SCREENING)  04/14/2013    Normal.     CORONARY ANGIOPLASTY WITH STENT PLACEMENT  06/23/2023    EGD, COLONOSCOPY  01/21/2022    with biopsies    ESOPHAGOSCOPY, RIGID, TRANSORAL  2016    normal, done x2    FOOT SURGERY Left 05/14/2010    Left foot.     Oral/Dental surgery  12/12/2017    OTHER SURGICAL HISTORY  06/14/2016    heart work up, stress test negative.    REPAIR, PSEUDOANEURYSM Left 07/11/2023    Procedure: REPAIR, PSEUDOANEURYSM;  Surgeon: Babs Maser, MD;  Location: [facility];  Service: Vascular;  Laterality: Left;    WRIST FRACTURE SURGERY Left 04/14/2013    Left wrist fracture.    [3]   Family History  Problem Relation Name Age of Onset    Stroke Mother          Cerebrovascular accident.     Alzheimer's disease  Mother      Heart disease Father      Stroke Father          Cerebrovascular accident.     Heart disease Brother      Other Brother          Heart valve replacement.     Colon cancer Paternal Grandmother          Malignant tumor of colon.    [4]   Social History  Socioeconomic History    Marital status: Divorced   Occupational History    Occupation: retired, help grandchildren, housework   Tobacco Use    Smoking status: Former     Current packs/day: 0.00     Average packs/day: 1 pack/day for 14.0 years (14.0 ttl pk-yrs)     Types: Cigarettes     Start date: 36     Quit date: 1983     Years since quitting: 42.3    Smokeless tobacco: Never    Tobacco comments:     Most recent tobacco use screening: 09/30/2017.     Vaping Use    Vaping status: Never Used   Substance and Sexual Activity    Alcohol use: Yes     Comment: rare    Drug use: Not Currently    Sexual activity: Not Currently   Social History Narrative    retired, help grandchildren, housework        Exercise: walking in the house    Lifetime fitness            Nutrition: has allowed foodlist 06/2020    Wt was 184lbs 11/2019    Check wt daily        Live with daughter and son-inlaw    3 grandchildren        Has 2 children        2 brothers and 2 sisters        Has good support system        Has medical living will 11/18/2020        Enjoys Clinical biochemist,  enjoys getting  together with friends        Counselling started every 2 weeks     Social Drivers of Psychologist, prison and probation services Strain: Low Risk  (12/16/2020)    Received from [facility]    Overall Financial Resource Strain (CARDIA)     Difficulty of Paying Living Expenses: Not hard at all   Stress: No Stress Concern Present (12/16/2020)    Received from [facility] - Occupational Stress Questionnaire     Feeling of Stress : Not at all    [facility] from [facility]    Social Network    Received from [facility]    HITS   [5]   Current Outpatient Medications:      aspirin 81 MG EC tablet, Take 1 tablet (81 mg) by mouth daily, Disp: , Rfl:     CALCIUM  PO, Take by mouth, Disp: , Rfl:     cetirizine (ZyrTEC) 10 MG tablet, every 24 hours, Disp: , Rfl:     clopidogrel (PLAVIX) 75 mg tablet, Take 1 tablet (75 mg) by mouth, Disp: , Rfl:     ezetimibe  (ZETIA ) 10 MG tablet, Take 1 tablet (10 mg) by mouth daily, Disp: 30 tablet, Rfl: 5    famotidine  (PEPCID ) 20 MG tablet, Take 1 tablet (20 mg) by mouth nightly as needed (Patient not taking: Reported on 08/25/2023), Disp: , Rfl:     levothyroxine  (SYNTHROID ) 75 MCG tablet, Take 1 tablet (75 mcg) by mouth daily Except none on Sunday, Disp: 90 tablet, Rfl: 3    metoprolol  tartrate (LOPRESSOR ) 25 MG tablet, 1 tablet (25 mg) 2 (two) times daily, Disp: , Rfl:     Multiple Vitamins-Minerals (MULTIVITAMIN ADULTS 50+ PO), multivitamin, Disp: , Rfl:     pantoprazole  (PROTONIX ) 40 MG tablet, Take 1 tablet (40 mg) by mouth daily, Disp: 90 tablet, Rfl: 3    rosuvastatin  (CRESTOR ) 40 MG tablet, Take 1 tablet (40 mg) by mouth daily, Disp: 90 tablet, Rfl: 1  [6]   Allergies  Allergen Reactions    Albuterol  Other (See Comments)     Tongue burning    Metronidazole Other (See Comments)    Tape     Wound Dressing Adhesive

## 2023-10-27 ENCOUNTER — Telehealth (INDEPENDENT_AMBULATORY_CARE_PROVIDER_SITE_OTHER): Payer: Self-pay | Admitting: Family Medicine

## 2023-10-27 ENCOUNTER — Other Ambulatory Visit (INDEPENDENT_AMBULATORY_CARE_PROVIDER_SITE_OTHER): Payer: Self-pay | Admitting: Family Medicine

## 2023-10-27 DIAGNOSIS — E782 Mixed hyperlipidemia: Secondary | ICD-10-CM

## 2023-10-27 NOTE — Telephone Encounter (Signed)
 Name of Caller: Self  Clinical Question: Patient is asking when does she need to come in and will it be fasting?   Last DOS: 07-07-23  Caller's preferred ph: [phone]  Please note:

## 2023-10-27 NOTE — Telephone Encounter (Signed)
 Appointment made 12-07-23

## 2023-12-06 ENCOUNTER — Encounter (INDEPENDENT_AMBULATORY_CARE_PROVIDER_SITE_OTHER): Payer: Self-pay | Admitting: Family Medicine

## 2023-12-07 ENCOUNTER — Encounter (INDEPENDENT_AMBULATORY_CARE_PROVIDER_SITE_OTHER): Payer: Self-pay | Admitting: Family Medicine

## 2023-12-07 ENCOUNTER — Ambulatory Visit (INDEPENDENT_AMBULATORY_CARE_PROVIDER_SITE_OTHER): Payer: Self-pay | Admitting: Family Medicine

## 2023-12-07 ENCOUNTER — Ambulatory Visit (INDEPENDENT_AMBULATORY_CARE_PROVIDER_SITE_OTHER): Admitting: Family Medicine

## 2023-12-07 VITALS — BP 122/82 | HR 70 | Temp 97.2°F | Resp 12 | Wt 166.6 lb

## 2023-12-07 DIAGNOSIS — N1832 Chronic kidney disease, stage 3b: Secondary | ICD-10-CM | POA: Insufficient documentation

## 2023-12-07 DIAGNOSIS — R1312 Dysphagia, oropharyngeal phase: Secondary | ICD-10-CM

## 2023-12-07 DIAGNOSIS — I119 Hypertensive heart disease without heart failure: Secondary | ICD-10-CM

## 2023-12-07 DIAGNOSIS — E7800 Pure hypercholesterolemia, unspecified: Secondary | ICD-10-CM

## 2023-12-07 DIAGNOSIS — R052 Subacute cough: Secondary | ICD-10-CM

## 2023-12-07 DIAGNOSIS — K219 Gastro-esophageal reflux disease without esophagitis: Secondary | ICD-10-CM

## 2023-12-07 DIAGNOSIS — E782 Mixed hyperlipidemia: Secondary | ICD-10-CM

## 2023-12-07 LAB — COMPREHENSIVE METABOLIC PANEL
ALT: 86 U/L — ABNORMAL HIGH (ref <=55)
AST (SGOT): 73 U/L — ABNORMAL HIGH (ref <=41)
Albumin/Globulin Ratio: 1.3 (ref 0.9–2.2)
Albumin: 4.1 g/dL (ref 3.5–5.0)
Alkaline Phosphatase: 71 U/L (ref 37–117)
Anion Gap: 10 (ref 5.0–15.0)
BUN: 15 mg/dL (ref 7–21)
Bilirubin, Total: 1.3 mg/dL — ABNORMAL HIGH (ref 0.2–1.2)
CO2: 27 meq/L (ref 17–29)
Calcium: 9.8 mg/dL (ref 7.9–10.2)
Chloride: 105 meq/L (ref 99–111)
Creatinine: 1.2 mg/dL — ABNORMAL HIGH (ref 0.4–1.0)
GFR: 46.9 mL/min/1.73 m2 — ABNORMAL LOW (ref >=60.0)
Globulin: 3.1 g/dL (ref 2.0–3.6)
Glucose: 101 mg/dL — ABNORMAL HIGH (ref 70–100)
Hemolysis Index: 4 {index}
Potassium: 5.3 meq/L (ref 3.5–5.3)
Protein, Total: 7.2 g/dL (ref 6.0–8.3)
Sodium: 142 meq/L (ref 135–145)

## 2023-12-07 LAB — LIPID PANEL
Cholesterol / HDL Ratio: 2.2 {index}
Cholesterol: 149 mg/dL (ref <=199)
HDL: 67 mg/dL (ref >=40)
LDL Calculated: 62 mg/dL (ref 0–99)
Triglycerides: 99 mg/dL (ref 34–149)
VLDL Calculated: 20 mg/dL (ref 10–40)

## 2023-12-07 MED ORDER — ROSUVASTATIN CALCIUM 40 MG PO TABS
40.0000 mg | ORAL_TABLET | Freq: Every day | ORAL | 3 refills | Status: AC
Start: 2023-12-07 — End: ?

## 2023-12-07 MED ORDER — EZETIMIBE 10 MG PO TABS
10.0000 mg | ORAL_TABLET | Freq: Every day | ORAL | 3 refills | Status: AC
Start: 2023-12-07 — End: ?
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
## 2023-12-13 ENCOUNTER — Other Ambulatory Visit: Payer: Self-pay | Admitting: Family Medicine

## 2023-12-14 ENCOUNTER — Ambulatory Visit (INDEPENDENT_AMBULATORY_CARE_PROVIDER_SITE_OTHER): Payer: Self-pay | Admitting: Family Medicine

## 2023-12-14 ENCOUNTER — Other Ambulatory Visit (INDEPENDENT_AMBULATORY_CARE_PROVIDER_SITE_OTHER): Payer: Self-pay | Admitting: Family Medicine

## 2023-12-14 ENCOUNTER — Telehealth (INDEPENDENT_AMBULATORY_CARE_PROVIDER_SITE_OTHER): Payer: Self-pay | Admitting: Family Medicine

## 2023-12-14 MED ORDER — LEVOTHYROXINE SODIUM 75 MCG PO TABS
75.0000 ug | ORAL_TABLET | Freq: Every day | ORAL | 0 refills | Status: DC
Start: 2023-12-14 — End: 2024-03-08

## 2023-12-14 NOTE — Telephone Encounter (Signed)
 Refill sent to [facility].

## 2023-12-14 NOTE — Telephone Encounter (Signed)
 Refill Request: Patient called office requesting for refill for levothyroxine  to [id number] - [place], [place] - [phone]       Last Office Visit :  06.25.2025      Medication: levothyroxine  (SYNTHROID ) 75 MCG tablet      How many days left of meds: 1 tablet left     Pharmacy: [id number] - [place], [place] - [phone]        Patient was informed that RX refills can take up to 72 hours to address?  yes

## 2024-02-14 ENCOUNTER — Encounter (INDEPENDENT_AMBULATORY_CARE_PROVIDER_SITE_OTHER): Payer: Self-pay | Admitting: Family Medicine

## 2024-02-16 ENCOUNTER — Encounter (INDEPENDENT_AMBULATORY_CARE_PROVIDER_SITE_OTHER): Payer: Self-pay | Admitting: Family Medicine

## 2024-03-01 ENCOUNTER — Other Ambulatory Visit (INDEPENDENT_AMBULATORY_CARE_PROVIDER_SITE_OTHER): Payer: Self-pay | Admitting: Family Medicine

## 2024-03-01 DIAGNOSIS — E039 Hypothyroidism, unspecified: Secondary | ICD-10-CM

## 2024-03-08 ENCOUNTER — Other Ambulatory Visit (INDEPENDENT_AMBULATORY_CARE_PROVIDER_SITE_OTHER): Payer: Self-pay

## 2024-03-08 DIAGNOSIS — E039 Hypothyroidism, unspecified: Secondary | ICD-10-CM

## 2024-03-08 MED ORDER — LEVOTHYROXINE SODIUM 75 MCG PO TABS
75.0000 ug | ORAL_TABLET | Freq: Every day | ORAL | 0 refills | Status: DC
Start: 2024-03-08 — End: 2024-03-25

## 2024-03-08 NOTE — Telephone Encounter (Signed)
 Lov on 12/07/23  Writer re-entered rx, but unable to delete original order.  Upcoming appt on 03/23/24

## 2024-03-15 ENCOUNTER — Other Ambulatory Visit (INDEPENDENT_AMBULATORY_CARE_PROVIDER_SITE_OTHER): Payer: Self-pay | Admitting: Family Medicine

## 2024-03-23 ENCOUNTER — Ambulatory Visit (INDEPENDENT_AMBULATORY_CARE_PROVIDER_SITE_OTHER): Admitting: Family Medicine

## 2024-03-23 ENCOUNTER — Encounter (INDEPENDENT_AMBULATORY_CARE_PROVIDER_SITE_OTHER): Payer: Self-pay | Admitting: Family Medicine

## 2024-03-23 VITALS — BP 128/78 | HR 77 | Temp 97.4°F | Ht 63.78 in | Wt 166.0 lb

## 2024-03-23 DIAGNOSIS — N1831 Chronic kidney disease, stage 3a: Secondary | ICD-10-CM

## 2024-03-23 DIAGNOSIS — Z23 Encounter for immunization: Secondary | ICD-10-CM

## 2024-03-23 DIAGNOSIS — H9190 Unspecified hearing loss, unspecified ear: Secondary | ICD-10-CM

## 2024-03-23 DIAGNOSIS — E559 Vitamin D deficiency, unspecified: Secondary | ICD-10-CM

## 2024-03-23 DIAGNOSIS — F330 Major depressive disorder, recurrent, mild: Secondary | ICD-10-CM

## 2024-03-23 DIAGNOSIS — I119 Hypertensive heart disease without heart failure: Secondary | ICD-10-CM

## 2024-03-23 DIAGNOSIS — E782 Mixed hyperlipidemia: Secondary | ICD-10-CM

## 2024-03-23 DIAGNOSIS — E039 Hypothyroidism, unspecified: Secondary | ICD-10-CM

## 2024-03-23 DIAGNOSIS — Z0001 Encounter for general adult medical examination with abnormal findings: Secondary | ICD-10-CM

## 2024-03-23 DIAGNOSIS — R7303 Prediabetes: Secondary | ICD-10-CM

## 2024-03-23 DIAGNOSIS — N1832 Chronic kidney disease, stage 3b: Secondary | ICD-10-CM

## 2024-03-23 DIAGNOSIS — E7800 Pure hypercholesterolemia, unspecified: Secondary | ICD-10-CM

## 2024-03-23 DIAGNOSIS — F411 Generalized anxiety disorder: Secondary | ICD-10-CM

## 2024-03-23 LAB — COMPREHENSIVE METABOLIC PANEL
ALT: 65 U/L — ABNORMAL HIGH (ref <=55)
AST (SGOT): 55 U/L — ABNORMAL HIGH (ref <=41)
Albumin/Globulin Ratio: 2.2 (ref 0.9–2.2)
Albumin: 4.7 g/dL (ref 3.5–4.9)
Alkaline Phosphatase: 78 U/L (ref 37–117)
Anion Gap: 8 (ref 5.0–15.0)
BUN: 19 mg/dL (ref 7–21)
Bilirubin, Total: 1.7 mg/dL — ABNORMAL HIGH (ref 0.2–1.2)
CO2: 27 meq/L (ref 17–29)
Calcium: 10 mg/dL (ref 7.9–10.2)
Chloride: 106 meq/L (ref 99–111)
Creatinine: 1.2 mg/dL — ABNORMAL HIGH (ref 0.4–1.0)
GFR: 46.9 mL/min/1.73 m2 — ABNORMAL LOW (ref >=60.0)
Globulin: 2.1 g/dL (ref 2.0–3.6)
Glucose: 99 mg/dL (ref 70–100)
Hemolysis Index: 5 {index}
Potassium: 5.2 meq/L (ref 3.5–5.3)
Protein, Total: 6.8 g/dL (ref 6.0–8.3)
Sodium: 141 meq/L (ref 135–145)

## 2024-03-23 LAB — LIPID PANEL
Cholesterol / HDL Ratio: 2.4 {index}
Cholesterol: 141 mg/dL (ref <=199)
HDL: 60 mg/dL (ref >=40)
LDL Calculated: 62 mg/dL (ref 0–99)
Triglycerides: 102 mg/dL (ref 34–149)
VLDL Calculated: 15 mg/dL (ref 10–40)

## 2024-03-23 LAB — LAB USE ONLY - CBC WITH DIFFERENTIAL
Absolute Basophils: 0.05 x10 3/uL (ref 0.00–0.08)
Absolute Eosinophils: 0.31 x10 3/uL (ref 0.00–0.44)
Absolute Immature Granulocytes: 0.02 x10 3/uL (ref 0.00–0.07)
Absolute Lymphocytes: 1.6 x10 3/uL (ref 0.42–3.22)
Absolute Monocytes: 0.7 x10 3/uL (ref 0.21–0.85)
Absolute Neutrophils: 3.69 x10 3/uL (ref 1.10–6.33)
Absolute nRBC: 0 x10 3/uL (ref <=0.00)
Basophils %: 0.8 %
Eosinophils %: 4.9 %
Hematocrit: 39.6 % (ref 34.7–43.7)
Hemoglobin: 13.1 g/dL (ref 11.4–14.8)
Immature Granulocytes %: 0.3 %
Lymphocytes %: 25.1 %
MCH: 29.5 pg (ref 25.1–33.5)
MCHC: 33.1 g/dL (ref 31.5–35.8)
MCV: 89.2 fL (ref 78.0–96.0)
MPV: 10.3 fL (ref 8.9–12.5)
Monocytes %: 11 %
Neutrophils %: 57.9 %
Platelet Count: 256 x10 3/uL (ref 142–346)
Preliminary Absolute Neutrophil Count: 3.69 x10 3/uL (ref 1.10–6.33)
RBC: 4.44 x10 6/uL (ref 3.90–5.10)
RDW: 15 % (ref 11–15)
WBC: 6.37 x10 3/uL (ref 3.10–9.50)
nRBC %: 0 /100{WBCs} (ref <=0.0)

## 2024-03-23 LAB — HEMOGLOBIN A1C
Average Estimated Glucose: 108.3 mg/dL
Hemoglobin A1C: 5.4 % (ref 4.6–5.6)

## 2024-03-23 LAB — VITAMIN D, 25 OH, TOTAL: Vitamin D 25-OH, Total: 40 ng/mL (ref 30–100)

## 2024-03-23 LAB — TSH: TSH: 0.38 u[IU]/mL (ref 0.35–4.94)

## 2024-03-23 MED ORDER — COVID-19 MRNA VACC (MODERNA) 50 MCG/0.5ML IM SUSY
50.0000 ug | PREFILLED_SYRINGE | Freq: Once | INTRAMUSCULAR | 0 refills | Status: AC
Start: 2024-03-23 — End: 2024-03-23

## 2024-03-23 NOTE — Progress Notes (Signed)
 [facility] [place]           Subjective     Chief Complaint   Patient presents with    Medicare Annual Wellness Visit     Pt is currently fasting.     History of Present Illness      11/18/2020    10:18 AM 02/02/2022     1:43 PM 02/04/2023    10:17 AM 04/11/2023     9:48 AM 05/19/2023     2:20 PM 07/07/2023     1:41 PM 03/23/2024    10:31 AM   PHQ2/PHQ9 SCORE    PHQ2 Score 0  1  1  2  1  2  1    PHQ9 Score 0 1 1 7 8 6 8        Data saved with a previous flowsheet row definition          02/21/2019     3:23 PM 04/11/2023     9:35 AM 05/19/2023     2:22 PM 07/07/2023     1:42 PM 03/23/2024    10:35 AM   GAD-7 Scores   Feeling nervous, anxious or on edge 2  2 2 1 2    Not being able to stop or control worrying 2  3 2 1 3    Worrying too much about different things 2  1 2 1 3    Trouble relaxing 1  1 2 1 1    Being so restless that it is hard to sit still 1  0 1 0 0   Becoming easily annoyed or irritable 1  1 1 1 2    Feeling afraid as if something awful might happen 1  0 0 0 2   GAD-7 Total Score 10 8 10 5 13        Data saved with a previous flowsheet row definition      Wt Readings from Last 7 Encounters:   03/23/24 75.3 kg (166 lb)   12/07/23 75.6 kg (166 lb 9.6 oz)   10/25/23 73.9 kg (163 lb)   08/25/23 76.7 kg (169 lb)   07/11/23 76.5 kg (168 lb 10.4 oz)   07/08/23 77.1 kg (170 lb)   07/07/23 77.1 kg (170 lb)        BMI Readings from Last 7 Encounters:   03/23/24 28.69 kg/m   12/07/23 28.60 kg/m   10/25/23 27.98 kg/m   08/25/23 29.01 kg/m   07/11/23 28.95 kg/m   07/08/23 29.18 kg/m   07/07/23 29.18 kg/m    Christine Bridges is a 73 year old female with hypertension and coronary artery disease who presents for a Medicare wellness visit.    She is currently fasting for blood work to check liver enzymes, cholesterol, sugars, and kidney function. Her cholesterol was previously 62 mg/dL, with a goal of 55 mg/dL due to her heart disease.    She experiences depression and anxiety, attributing it to tension at home and  concerns about her daughter's workload and future business endeavors. She is seeing a therapist, which she finds helpful despite disliking the sessions. There is a family history of anxiety, with siblings who also worry frequently.    She completed cardiac rehabilitation and participated in swimming during the summer. She has joined a gym for [employer], which she enjoys and finds beneficial for her joints. She also walks and uses weights at the gym. She experiences fatigue after exercise but can do 20 minutes on the treadmill and work with weights.  She mentions a 30% blockage in her heart and is cautious about medications that might affect it.    She reports a recent root canal with complications and is seeing her dentist regularly. She also sees an eye doctor annually and has a history of earwax buildup, which was cleaned in August.    She has a history of iron infusions, which caused flu-like symptoms after the first infusion. She is due for a tetanus shot and is considering the shingles vaccine when she feels well.    She has lost height over the years, now measuring 5'3. She had a DEXA scan last year.    She helps around the house, although her daughter advises her to rest more. She uses a cane for stability due to balance issues and is considering resuming physical therapy for balance.    She is independent in daily activities, including bathing, dressing, and managing finances. She has a medical living will in place.    Htn: stable  Ckd: check today  Vitamin d  def: not taking  Review of Systems    Objective   BP 128/78   Pulse 77   Temp 97.4 F (36.3 C)   Ht 1.62 m (5' 3.78)   Wt 75.3 kg (166 lb)   SpO2 98%   BMI 28.69 kg/m   Physical Exam  Constitutional:       General: She is not in acute distress.  HENT:      Head: Atraumatic.      Right Ear: Tympanic membrane, ear canal and external ear normal. There is no impacted cerumen.      Left Ear: Tympanic membrane, ear canal and external ear  normal. There is no impacted cerumen.      Nose: No congestion or rhinorrhea.   Eyes:      General: No scleral icterus.        Right eye: No discharge.         Left eye: No discharge.      Extraocular Movements: Extraocular movements intact.      Conjunctiva/sclera: Conjunctivae normal.      Pupils: Pupils are equal, round, and reactive to light.   Cardiovascular:      Rate and Rhythm: Regular rhythm.      Heart sounds: Murmur heard.   Pulmonary:      Effort: Pulmonary effort is normal.      Breath sounds: Normal breath sounds. No wheezing.   Abdominal:      Palpations: Abdomen is soft.      Tenderness: There is no abdominal tenderness.   Musculoskeletal:         General: No swelling or tenderness.      Cervical back: Neck supple.   Skin:     Findings: No lesion.   Neurological:      General: No focal deficit present.   Psychiatric:         Behavior: Behavior normal.       Physical Exam       Results        Assessment/Plan   1. Encounter for routine adult health examination with abnormal findings    2. Immunization due  - COVID-19 mRNA 2025-2026 vaccine (MODERNA/SPIKEVAX) 50 MCG/0.5ML IM injection; Inject 0.5 mLs (50 mcg) into the muscle once for 1 dose  Dispense: 0.5 mL; Refill: 0  - Flu vaccine TRIVALENT, 65 yrs and older (FLUZONE HIGH-DOSE) single-dose PF, 0.5 mL    3. Vitamin D  deficiency  - Vitamin D ,  25 OH, Total; Future  - Vitamin D , 25 OH, Total    4. Hypothyroidism, unspecified type  - TSH; Future  - TSH    5. Mixed hyperlipidemia    6. Chronic kidney disease, stage 3b (CMS/HCC)  - CBC with Differential (Order); Future  - Comprehensive Metabolic Panel; Future  - CBC with Differential (Order)  - Comprehensive Metabolic Panel    7. Hypercholesterolemia  - Lipid Panel; Future  - Lipid Panel    8. Prediabetes  - Hemoglobin A1C; Future  - Hemoglobin A1C    9. GAD (generalized anxiety disorder)  - Follow Up In Primary Care; Future    10. Stage 3a chronic kidney disease (CMS/HCC)    11. Hypertensive heart disease  without heart failure     Assessment & Plan    Coronary artery disease  Coronary artery disease with 30% blockage. Completed cardiac rehab and continues with water  aerobics and gym exercises.  - Encourage daily exercise, including water  aerobics and treadmill walking.  - Ensure clearance from cardiologist for exercise regimen.    Hypertension  Blood pressure is well-controlled.    Hyperlipidemia  Cholesterol levels previously at 62 mg/dL, with a goal of 55 mg/dL or lower due to heart disease.  - Order cholesterol levels as part of fasting labs.    Depression and anxiety  Moderate anxiety and depression. Currently seeing a therapist, which is helping. Prefers to avoid medication and continue with therapy.  - Continue therapy sessions.  - Encourage daily exercise to help manage anxiety and depression.  - Reassess in 3-6 months or sooner if symptoms worsen.    Osteopenia   Osteopenia with height loss. Last DEXA scan was last year; next due next year.  - Encourage weight-bearing exercises twice a week.  - Ensure adequate calcium  intake (1200 mg daily) and vitamin D  supplementation.  - Order vitamin D  level as part of labs.    Balance impairment  Balance issues with previous physical therapy stopped due to knee injury.  - Recommend resuming physical therapy for balance.    Hearing loss  Mild hearing loss, particularly with high pitches. Last hearing test in March.  - Encourage follow-up with ENT as needed.    Iron deficiency, status post iron infusions  Previously received iron infusions with adverse reactions.    Hypothyroidism  Thyroid  function normal 7 months ago.  - Continue current thyroid  medication.    General Health Maintenance  Discussed immunizations and screenings. Mammogram and colonoscopy are up to date.  - Administer flu shot today.  - Send COVID booster prescription to pharmacy.  - Encourage tetanus and shingles vaccines at pharmacy.  - Send fall prevention information via MyChart.  - Encourage home safety  modifications, including grab bars and decluttering.    Verbal consent obtained to record this visit.   Recheck in 3 months

## 2024-03-23 NOTE — Assessment & Plan Note (Addendum)
 Orders:    Vitamin D, 25 OH, Total; Future

## 2024-03-23 NOTE — Assessment & Plan Note (Signed)
 Orders:    Hemoglobin A1C; Future

## 2024-03-23 NOTE — Progress Notes (Signed)
 [facility]  Medicare Wellness Visit               Christine Bridges is a 73 y.o. female who presents today for the following Medicare Wellness Visit: Annual Wellness Visit - Subsequent    History of Present Illness  Christine Bridges is a 73 year old female with hypertension and coronary artery disease who presents for a Medicare wellness visit.    She is currently fasting for blood work to check liver enzymes, cholesterol, sugars, and kidney function. Her cholesterol was previously 62 mg/dL, with a goal of 55 mg/dL due to her heart disease.    She experiences depression and anxiety, attributing it to tension at home and concerns about her daughter's workload and future business endeavors. She is seeing a therapist, which she finds helpful despite disliking the sessions. There is a family history of anxiety, with siblings who also worry frequently.    She completed cardiac rehabilitation and participated in swimming during the summer. She has joined a gym for [employer], which she enjoys and finds beneficial for her joints. She also walks and uses weights at the gym. She experiences fatigue after exercise but can do 20 minutes on the treadmill and work with weights.    She mentions a 30% blockage in her heart and is cautious about medications that might affect it.    She reports a recent root canal with complications and is seeing her dentist regularly. She also sees an eye doctor annually and has a history of earwax buildup, which was cleaned in August.    She has a history of iron infusions, which caused flu-like symptoms after the first infusion. She is due for a tetanus shot and is considering the shingles vaccine when she feels well.    She has lost height over the years, now measuring 5'3. She had a DEXA scan last year.    She helps around the house, although her daughter advises her to rest more. She uses a cane for stability due to balance issues and is considering resuming  physical therapy for balance.    She is independent in daily activities, including bathing, dressing, and managing finances. She has a medical living will in place.  Health Risk Assessment   During the past month, how would you rate your general health?:  Good  Which of the following tasks can you do without assistance - drive or take the bus alone; shop for groceries or clothes; prepare your own meals; do your own housework/laundry; handle your own finances/pay bills; eat, bathe or get around your home?: Drive or take the bus alone, Shop for groceries or clothes, Prepare your own meals, Do your own housework/laundry, Handle your own finances/pay bills, Eat, bathe, dress or get around your home  Which of the following problems have you been bothered by in the past month - dizzy when standing up; problems using the phone; feeling tired or fatigued; moderate or severe body pain?: Problems using the phone, Feeling tired or fatigued, Moderate or severe body pain  Do you exercise for about 20 minutes 3 or more days per week?:No  During the past month was someone available to help if you needed and wanted help?  For example, if you felt nervous, lonely, got sick and had to stay in bed, needed someone to talk to, needed help with daily chores or needed help just taking care of yourself.: Yes  Do you always wear a seat belt?: Yes  Do you have any trouble taking medications the way you have been told to take them?: No  Have you been given any information that can help you with keeping track of your medications?: No  Do you have trouble paying for your medications?: No  Hospitalizations   Hospitalization within past year: No    Screenings         07/07/2023 03/23/2024   Ambulatory Screenings   Falls Risk: Terrilee more than 2 times in past year  N   Falls Risk: Suffer any injuries?  N   Depression: PHQ2 Total Score 2  1   Depression: PHQ9 Total Score 6 8       Data saved with a previous flowsheet row definition        Substance  Use Disorder Screen:  Christine Bridges  reports that she quit smoking about 42 years ago. Her smoking use included cigarettes. She started smoking about 56 years ago. She has a 14 pack-year smoking history. She has never used smokeless tobacco. She reports current alcohol use. She reports that she does not currently use drugs.            Functional Ability/Level of Safety   Falls Risk/Home Safety Assessment:  Have you been given any information that can help you with hazards in your house, such as scatter rugs, furniture, etc?: No  Do you feel unsteady when standing or walking?: Yes  Do you worry about falling?: Yes  Have you fallen two or more times in the past year?: No  Did you suffer any injuries from your falls in the past year?: No    Home Safety:   Low Risk for Falls  Get Up and Go: <20 secs    Hearing Assessment:  patient reports hearing slightly decreased      Visual Acuity:     If no visual acuity exam above, the patient has declined the visual acuity exam portion of this encounter.      Exercise:   Moderate ( i.e. brisk walking )    Diet:  Diet: - consumes a well balanced diet  - compliant with low carb diet      Activities of Daily Living   ADL's  Bathing: Independent  Dressing: Independent  Mobility: Independent  Transfer: Independent  Eating: Independent  Toileting: Independent    IADL's  Phone: Independent  Housekeeping: Independent  Laundry: Independent  Transportation: Independent  Medications: Independent  Finances: Independent    ADL assistance:   No assistance needed    Social Activities/Engagement   Frequency of Communication with Friends and Family:  often    Frequency of Social Gatherings with Friends and Family:   often    Advanced Care Planning   Discussion of Advance Directives:   Has an Scientist, water quality. A copy has been provided and is in chart.         Exam   BP 128/78   Pulse 77   Temp 97.4 F (36.3 C)   Ht 1.62 m (5' 3.78)   Wt 75.3 kg (166 lb)   SpO2 98%   BMI 28.69 kg/m    Physical Exam      Evaluation of Cognitive Function   Mood/affect: Appropriate  Appearance:  alert, well appearing, and in no distress  Family member/caregiver input: Not present in room      Mini-Cog Score:  3 recalled words - negative screen for dementia        Assessment/Plan     Assessment &  Plan  Encounter for routine adult health examination with abnormal findings         Immunization due    Orders:    COVID-19 mRNA 2025-2026 vaccine (MODERNA/SPIKEVAX) 50 MCG/0.5ML IM injection; Inject 0.5 mLs (50 mcg) into the muscle once for 1 dose    Flu vaccine TRIVALENT, 65 yrs and older (FLUZONE HIGH-DOSE) single-dose PF, 0.5 mL    Vitamin D  deficiency    Orders:    Vitamin D , 25 OH, Total; Future    Hypothyroidism, unspecified type    Orders:    TSH; Future    Mixed hyperlipidemia         Chronic kidney disease, stage 3b (CMS/HCC)    Orders:    CBC with Differential (Order); Future    Comprehensive Metabolic Panel; Future    Hypercholesterolemia    Orders:    Lipid Panel; Future    Prediabetes    Orders:    Hemoglobin A1C; Future    GAD (generalized anxiety disorder)    Orders:    Follow Up In Primary Care; Future      Assessment & Plan  Adult Wellness Visit  Routine Medicare wellness visit with well-controlled blood pressure.  - Order fasting labs including liver enzymes, cholesterol, sugars, and kidney function.    Coronary artery disease  Coronary artery disease with 30% blockage. Completed cardiac rehab and continues with water  aerobics and gym exercises.  - Encourage daily exercise, including water  aerobics and treadmill walking.  - Ensure clearance from cardiologist for exercise regimen.    Hypertension  Blood pressure is well-controlled.    Hyperlipidemia  Cholesterol levels previously at 62 mg/dL, with a goal of 55 mg/dL or lower due to heart disease.  - Order cholesterol levels as part of fasting labs.    Depression and anxiety  Moderate anxiety and depression. Currently seeing a therapist, which is helping.  Prefers to avoid medication and continue with therapy.  - Continue therapy sessions.  - Encourage daily exercise to help manage anxiety and depression.  - Reassess in 3-6 months or sooner if symptoms worsen.    Osteopenia or osteoporosis  Osteopenia or osteoporosis with height loss. Last DEXA scan was last year; next due next year.  - Encourage weight-bearing exercises twice a week.  - Ensure adequate calcium  intake (1200 mg daily) and vitamin D  supplementation.  - Order vitamin D  level as part of labs.    Balance impairment  Balance issues with previous physical therapy stopped due to knee injury.  - Recommend resuming physical therapy for balance.    Hearing loss  Mild hearing loss, particularly with high pitches. Last hearing test in March.  - Encourage follow-up with ENT as needed.    Iron deficiency, status post iron infusions  Previously received iron infusions with adverse reactions.    Hypothyroidism  Thyroid  function normal 7 months ago.  - Continue current thyroid  medication.    General Health Maintenance  Discussed immunizations and screenings. Mammogram and colonoscopy are up to date.  - Administer flu shot today.  - Send COVID booster prescription to pharmacy.  - Encourage tetanus and shingles vaccines at pharmacy.  - Send fall prevention information via MyChart.  - Encourage home safety modifications, including grab bars and decluttering.      Personalized Prevention Plan   The patient was provided with a personalized prevention plan via   the Patient Instructions tab of this visit  History/Care Team   Patient Care Team:  Christobal Borrow, MD as PCP - General (Family Medicine)  Pearla Peon, MD as Consulting Physician (Medical Oncology)  Katheryn Carne, MD as Consulting Physician (Interventional Cardiology)  Medical, surgical, family history reviewed and updated during  this encounter  Medication list updated and reconciled during this encounter    Additional Documentation                                                                                                                                                Verbal consent obtained to record this visit when ambient technology is utilized.

## 2024-03-23 NOTE — Patient Instructions (Addendum)
 MEDICARE WELLNESS PERSONAL PREVENTION PLAN   As part of the Medicare Wellness portion of your visit today, we are providing you with this personalized preventative plan of care. The list below includes many common screening recommendations from the USPSTF ([place]  Preventive Services Task Force) but is not meant to be comprehensive. You may be eligible for other preventative services depending upon your personal risk factors.     Health Maintenance   Topic Date Due    Pap Smear  No need    Shingrix Vaccine 50+ (2) 01/24/2020, get it    Tetanus Ten-Year  03/14/2021, get it      Medicare Annual Wellness Visit  02/04/2024    COVID-19 Vaccine (10 - 2024-25 season) 02/13/2024, sent rx    Statin Use  03/09/2025    FALLS RISK ANNUAL  03/23/2025    DEPRESSION SCREENING  03/23/2025    Breast Cancer Screening - Mammogram  12/12/2025    Colorectal Cancer Screening  01/22/2027    HEPATITIS C SCREENING  Completed    Influenza Vaccine  Completed    DXA Scan  Completed    Pneumonia Vaccine Age 48 Years and Older  Completed     Health Maintenance Topics with due status: Overdue       Topic Date Due    Shingrix Vaccine 50+ 01/24/2020    Tetanus Ten-Year 03/14/2021    Medicare Annual Wellness Visit 02/04/2024    COVID-19 Vaccine 02/13/2024      Immunization History   Administered Date(s) Administered    COVID-19 mRNA BIVALENT vaccine 12 years and above (Moderna) 50 mcg/0.5 mL 09/24/2021    COVID-19 mRNA MONOVALENT vaccine PRIMARY SERIES 12 years and above (Moderna) 100 mcg/0.5 mL 07/16/2019, 07/19/2019, 08/13/2019, 08/16/2019, 05/02/2020, 02/04/2021    COVID-19 mRNA vaccine 12 years and above (MODERNA/SPIKEVAX) 50 mcg/0.5 mL 04/22/2022, 07/07/2023    DTaP (diphtheria, tetanus, acellular pertussis) (INFANRIX) 03/15/2011    Influenza (Flu) vaccine 02/13/2015    Influenza quadrivalent (AFLURIA/FLUZONE), 6 months and older, multi-dose, 5 mL 04/14/2016, 03/14/2018    Influenza quadrivalent (FLUAD) adujvanted,  65 years and older,  0.5 mL, preservative free 03/21/2019    Influenza quadrivalent (FLUBOK) recombinant, 18 years and older, 0.5 mL preservative free 03/07/2015, 03/14/2018    Influenza quadrivalent (FLUCELVAX) MDCK, 6 months and older, 0.5 mL preservative free 04/14/2016, 03/14/2018    Influenza quadrivalent (FLUZONE), 3 years and older, 0.5 mL, preservative free 03/07/2015, 03/14/2018    Influenza quadrivalent high-dose (FLUZONE HIGH-DOSE) 65 years and older, 0.7 mL, preservative free 03/30/2020, 04/08/2021, 04/22/2022    Influenza trivalent 2025-2026 adjuvanted, 65 yrs+ (FLUAD) 0.5 mL 04/27/2023    Influenza trivalent 2025-2026 inactivated high-dose vaccine 65 yrs+ (FLUZONE HIGH DOSE) 0.5 mL 04/29/2016, 04/24/2017, 03/30/2018, 03/21/2019, 04/14/2020, 03/23/2024    Influenza vaccine, quadrivalent, 6 months and older (Afluria/Fluzone), multi-dose with preservative, 5 mL 04/14/2016, 03/14/2018    Pneumococcal conjugate (PREVNAR 20) 20-valent, preservative free 04/27/2023    Pneumococcal conjugate (PREVNAR) 13-valent 04/29/2016    Pneumococcal polysaccharide (PNEUMOVAX 23), 23-valent 04/14/2016    RSV vaccine (AREXVY), recombinant, RSVpreF3, adjuvant reconstituted, PF, 0.5 mL 05/19/2022    Tdap (tetanus, diphtheria reduced, acellular pertussis) (ADACEL/BOOSTRIX), adsorbed 06/14/2010, 03/15/2011        Your major risk factors:   Recommendations for improvement:      Colorectal Cancer Screening - All adults 45-75 yrs should undergo periodic colorectal cancer screening. The decision to screen for colorectal cancer in adults aged 5 to 68 years should be an individual one, taking into account  your overall health and prior screening history.   Breast Cancer Screening - Women aged 40-67yrs should have mammograms every other year (please note that this recommendation may not be appropriate for every woman - your physician can answer specific questions you may have). The USPSTF concludes that the current evidence is insufficient to assess the  balance of benefits and harms of screening mammography in women aged 60 years or older.  These recommendations do not apply to persons who have a genetic marker or syndrome associated with a high risk of breast cancer (eg, BRCA1 or BRCA2 genetic variation), a history of high-dose radiation therapy to the chest at a young age, or previous breast cancer or a high-risk breast lesion on previous biopsies.   Cervical Cancer Screening - Women over 74 do not require pap smears as long as prior screening has been normal and are not otherwise at high risk for cervical cancer. The USPSTF recommends screening for cervical cancer every 3 years with cervical cytology alone in women aged 40 to 42 years. For women aged 8 to 82 years, the USPSTF recommends screening every 3 years with cervical cytology alone, every 5 years with high-risk human papillomavirus (hrHPV) testing alone, or every 5 years with hrHPV testing in combination with cytology (cotesting).   Osteoporosis Screening -  The USPSTF recommends screening for osteoporosis with bone measurement testing to prevent osteoporotic fractures in women 65 years and older.  For postmenopausal women younger than 65 years who are at increased risk of osteoporosis, as determined by a formal clinical risk assessment tool, the USPSTF recommends screening for osteoporosis with bone measurement testing to prevent osteoporotic fractures.   Hepatitis C Screening - Recommend screening for hepatitis C virus (HCV) infection in all adults aged 10 to 38 years.  Lung cancer Screening - Recommend annual screening for lung cancer with low-dose computed tomography (LDCT) in adults ages 54 to 65 years who have a 20 pack-year smoking history and currently smoke or have quit within the past 15 years.  Recommended Vaccinations from the CDC ([place].S.  Centers for Disease Control and Prevention)   Influenza one dose annually   COVID vaccine - stay current with the most up-to-date COVID  update/booster  Tetanus/diphtheria (Tdap) one booster every 10 years   Zoster/Shingles - (Shingrix) two doses after age 70 (second dose given 2-6 months after first dose)  Pneumococcal 20-valent or 21-valent conjugate vaccine - one dose for adults aged >=65 years with no history of prior pneumococcal vaccination.  Pneumococcal 20-valent or 21-valent conjugate vaccine -  one dose for adults aged >=65 years with PPSV23 only or PCV13 only given more than 1 yr ago.  Pneumococcal 20-valent or 21-valent conjugate vaccine - one dose for adults aged >=65 years who have completed PCV-13 and PPSV23 after 74yrs old and it has been greater than 46yrs (shared clinical decision-making is recommended regarding administration of this vaccine).   RSV (Respiratory Syncytial Virus) vaccine for everyone ages 38 and older  RSV (Respiratory Syncytial Virus)  vaccine ages 75-74 who are at increased risk of severe RSV disease (for example, chronic heart/lung/liver/kidney disease, immunocompromised)    PERSONAL PREVENTION PLAN   Your Personal Prevention Plan is based on your overall health and your responses to the health questionnaire you completed. The following information is for you to review in addition to the recommendations, referrals, and tests we have discussed at your visit.     Physical Activity:   Physical activity can help you maintain a healthy weight,  prevent or control illness, reduce stress, and sleep better. It can also help you improve your balance to avoid falls. Try to build up to and maintain a total of 30 minutes of activity each day. If you are able, try walking, doing yard or housework, and taking the stairs more often. You can also strengthen your muscles with exercises done while sitting or lying down. Web resource - https://www.carpenter-henry.info/  Emotional Health:   Feeling "down in the dumps" or anxious every now and then is a natural part of life. If this feeling lasts for a few weeks or  more, talk with me as soon as possible. It could be a sign of a problem that needs treatment. There are many types of treatment available. Web resource -  RXPreview.de  Falls:   You can reduce your risk of falling by making changes in your home. Remove items that may cause tripping, improve lighting, and consider installing grab bars.   Talk with me if you have problems with balance and walking. To prevent falls, you may need your vision, hearing, or blood pressure checked. Exercises to improve your strength and balance, or using a cane or walker, may help. Review your medicines with me at every visit, because some can affect balance. Please be sure to let me know if you fall or are fearful you may fall. Web resource - BounceThru.fi  Urinary Leakage:   Urine leakage is common, but it is not a normal part of aging. Talk with me about any urine leakage so that the cause can be found and treated. Treatment can include bladder training, exercises, medicine or surgery.   Pain:   We all have aches and pains at times, but chronic pain can change how you feel and live every day. Please talk with me about any symptoms of chronic pain so that we can determine how best to treat.   Sleep:   Getting a good night's sleep is vital to your health and well-being and can help prevent or manage health problems. Often, sleep can be improved by changing behaviors, including when you go to bed and what you do before bed. Sleep apnea can cause problems such as struggling to stay awake during the day. Please let me know if you would like to learn more about improving your sleep and/or think you may have sleep apnea. Web resource -  ThousandQuestions.com.cy  Seat Belt:   Please remember to wear a seat belt when driving or riding in a vehicle. It is one of the most important things you can do to stay safe in a car.   Nutrition:   Remember to eat  plenty of fruits, vegetables, whole grains, and dairy. Drink at least 64 ounces (8 full glasses) of water  a day, unless you have been advised to limit fluids.  Web resource- PickSeat.dk  Alcohol:   Alcohol can have a greater effect on older people, who may feel its effects at a lower amount. Older people should limit alcoholic drinks (no more than one a day for women and no more than two a day for men). Please let me know if alcohol use becomes a problem.  Web resource - AgingMortgage.ca  Tobacco:   Not smoking or using other forms of tobacco is one of the most important things you can do for your health. Here is some more information about the importance of quitting smoking and how to quit smoking - Mudlogger - BroadJournal.com.pt  Advance Directives:   There may  come a time when medical decisions need to be made on your behalf. Please talk with your family, and with me, about your wishes. It is important to provide information about your decisions, and any formal advance directives, for your medical record. Here is additional information on advanced directives - Web resource - MediaExhibitions.no  Additional Support:   Sometimes it can be challenging to manage all aspects of daily life. Finding the right support can help you maintain or improve your health and independence. Please let me know if you would like to talk further about finding resources to assist you.

## 2024-03-23 NOTE — Assessment & Plan Note (Signed)
 Orders:    Follow Up In Primary Care; Future

## 2024-03-23 NOTE — Assessment & Plan Note (Signed)
 Orders:    Lipid Panel; Future

## 2024-03-23 NOTE — Assessment & Plan Note (Signed)
 Orders:    CBC with Differential (Order); Future    Comprehensive Metabolic Panel; Future

## 2024-03-25 ENCOUNTER — Ambulatory Visit (INDEPENDENT_AMBULATORY_CARE_PROVIDER_SITE_OTHER): Payer: Self-pay | Admitting: Family Medicine

## 2024-03-25 DIAGNOSIS — K760 Fatty (change of) liver, not elsewhere classified: Secondary | ICD-10-CM | POA: Insufficient documentation

## 2024-03-25 DIAGNOSIS — E039 Hypothyroidism, unspecified: Secondary | ICD-10-CM

## 2024-03-25 MED ORDER — LEVOTHYROXINE SODIUM 75 MCG PO TABS
75.0000 ug | ORAL_TABLET | Freq: Every day | ORAL | 3 refills | Status: AC
Start: 2024-03-25 — End: ?

## 2024-03-25 NOTE — Progress Notes (Signed)
 Hi Nurse/Clinical staff    Please advise patient I reviewed the results and below are the comments and recommendations:    Chronic kidney disease stable at 1.2 crt and gfr 47  Liver enzymes coming down. She has fatty liver . So low carb diet, avoiding sugars is key and losing at least 5 lbs by 2-3 months when need fasting office visit to check on this  Normalt thyroid , vitamind. Normal sugars, cbc    Cholesterol  total normal at 141 , good hdl good at 60. Her bad LDL is 62. With her heat disease best to have under 55. Has she been taking rousuvastatin 40 and zetia  10 daily? Make sure taking and maximize low cholesterol diet and daily exercise. Need recheck next visit    Thank you.  Dr. CHRISTOBAL

## 2024-03-29 ENCOUNTER — Encounter (INDEPENDENT_AMBULATORY_CARE_PROVIDER_SITE_OTHER): Payer: Self-pay

## 2024-04-21 ENCOUNTER — Encounter (INDEPENDENT_AMBULATORY_CARE_PROVIDER_SITE_OTHER): Payer: Self-pay

## 2024-05-21 ENCOUNTER — Encounter (INDEPENDENT_AMBULATORY_CARE_PROVIDER_SITE_OTHER): Payer: Self-pay

## 2024-05-24 ENCOUNTER — Encounter (INDEPENDENT_AMBULATORY_CARE_PROVIDER_SITE_OTHER): Payer: Self-pay | Admitting: Family Medicine

## 2024-05-30 ENCOUNTER — Other Ambulatory Visit (INDEPENDENT_AMBULATORY_CARE_PROVIDER_SITE_OTHER): Payer: Self-pay | Admitting: Family Medicine

## 2024-05-30 DIAGNOSIS — E039 Hypothyroidism, unspecified: Secondary | ICD-10-CM

## 2024-06-21 ENCOUNTER — Encounter (INDEPENDENT_AMBULATORY_CARE_PROVIDER_SITE_OTHER): Payer: Self-pay

## 2024-06-22 ENCOUNTER — Encounter (INDEPENDENT_AMBULATORY_CARE_PROVIDER_SITE_OTHER): Payer: Self-pay | Admitting: Family Medicine

## 2024-06-22 ENCOUNTER — Telehealth: Payer: Self-pay | Admitting: Family Medicine

## 2024-06-22 NOTE — Telephone Encounter (Signed)
 Bone density report left at front desk folder to fax as requested.

## 2024-06-22 NOTE — Telephone Encounter (Signed)
 Copied from CRM #[id number]. Topic: Clinical Support - Speak With Nurse  >> Jun 22, 2024 11:28 AM Truman HERO wrote:  Rost, Darrel called about Clinical Support - Speak With Nurse.  Additional details:      Patient needs her last bone density result sent to     Dr. Mauricia [facility]  Office : [phone]

## 2024-07-03 ENCOUNTER — Encounter (INDEPENDENT_AMBULATORY_CARE_PROVIDER_SITE_OTHER): Payer: Self-pay | Admitting: Family Medicine

## 2024-07-04 ENCOUNTER — Telehealth (INDEPENDENT_AMBULATORY_CARE_PROVIDER_SITE_OTHER): Payer: Self-pay | Admitting: Family Medicine

## 2024-07-04 NOTE — Telephone Encounter (Signed)
 Patient called to speak with a nurse regarding levothyroxine  medication question.   Patient reports that she took her dose of levothyroxine  75mcg at 8 am and again at 10 am on Tuesday 07/03/24.  Patient does not report any adverse affect currently.  Patient advised by this nurse per NP Alvarado to skip today's dose and resume regular schedule tomorrow.   Also advised to keep upcoming appointment and to advise this office if any change or concern arises.  Understanding stated by the patient.

## 2024-07-04 NOTE — Telephone Encounter (Signed)
 Copied from CRM #[id number]. Topic: Appointment Scheduling - Schedule Appointment  >> Jul 04, 2024  8:15 AM Melita DEL wrote:  Killam, Addilyn called about Appointment Scheduling - Schedule Appointment.  Additional details:  Patient is requesting a recommendation for a Kidney doctor/GI. Please advice - Patient may be reached back at [phone]

## 2024-08-23 ENCOUNTER — Ambulatory Visit (INDEPENDENT_AMBULATORY_CARE_PROVIDER_SITE_OTHER): Admitting: Family Medicine
